# Patient Record
Sex: Female | Born: 1940
Health system: Southern US, Community
[De-identification: ages and names within clinical notes are randomized; demographics above are authoritative.]

## PROBLEM LIST (undated history)

## (undated) DIAGNOSIS — J449 Chronic obstructive pulmonary disease, unspecified: Secondary | ICD-10-CM

## (undated) DIAGNOSIS — K7689 Other specified diseases of liver: Secondary | ICD-10-CM

## (undated) DIAGNOSIS — S32040A Wedge compression fracture of fourth lumbar vertebra, initial encounter for closed fracture: Secondary | ICD-10-CM

## (undated) DIAGNOSIS — I10 Essential (primary) hypertension: Secondary | ICD-10-CM

## (undated) DIAGNOSIS — M1990 Unspecified osteoarthritis, unspecified site: Secondary | ICD-10-CM

## (undated) HISTORY — DX: Chronic obstructive pulmonary disease, unspecified: J44.9

## (undated) HISTORY — PX: ABDOMINAL HYSTERECTOMY: SHX81

## (undated) HISTORY — DX: Other specified diseases of liver: K76.89

## (undated) HISTORY — DX: Unspecified osteoarthritis, unspecified site: M19.90

## (undated) HISTORY — DX: Wedge compression fracture of fourth lumbar vertebra, initial encounter for closed fracture: S32.040A

## (undated) HISTORY — DX: Essential (primary) hypertension: I10

---

## 2000-07-28 ENCOUNTER — Ambulatory Visit (HOSPITAL_COMMUNITY): Admission: RE | Admit: 2000-07-28 | Discharge: 2000-07-28 | Payer: Self-pay | Admitting: Internal Medicine

## 2000-07-28 ENCOUNTER — Encounter: Payer: Self-pay | Admitting: Internal Medicine

## 2002-09-19 ENCOUNTER — Encounter (INDEPENDENT_AMBULATORY_CARE_PROVIDER_SITE_OTHER): Payer: Self-pay | Admitting: Family Medicine

## 2004-06-18 ENCOUNTER — Ambulatory Visit (HOSPITAL_COMMUNITY): Admission: RE | Admit: 2004-06-18 | Discharge: 2004-06-18 | Payer: Self-pay | Admitting: Internal Medicine

## 2004-07-03 ENCOUNTER — Ambulatory Visit (HOSPITAL_COMMUNITY): Admission: RE | Admit: 2004-07-03 | Discharge: 2004-07-03 | Payer: Self-pay | Admitting: Internal Medicine

## 2004-07-04 ENCOUNTER — Ambulatory Visit (HOSPITAL_COMMUNITY): Admission: RE | Admit: 2004-07-04 | Discharge: 2004-07-04 | Payer: Self-pay | Admitting: Internal Medicine

## 2004-08-04 ENCOUNTER — Ambulatory Visit (HOSPITAL_COMMUNITY): Admission: RE | Admit: 2004-08-04 | Discharge: 2004-08-04 | Payer: Self-pay | Admitting: Internal Medicine

## 2005-03-26 ENCOUNTER — Ambulatory Visit: Payer: Self-pay | Admitting: Family Medicine

## 2005-04-13 ENCOUNTER — Ambulatory Visit (HOSPITAL_COMMUNITY): Admission: RE | Admit: 2005-04-13 | Discharge: 2005-04-13 | Payer: Self-pay | Admitting: Family Medicine

## 2005-04-23 ENCOUNTER — Ambulatory Visit: Payer: Self-pay | Admitting: Family Medicine

## 2005-04-27 ENCOUNTER — Encounter (INDEPENDENT_AMBULATORY_CARE_PROVIDER_SITE_OTHER): Payer: Self-pay | Admitting: Family Medicine

## 2005-04-27 LAB — CONVERTED CEMR LAB: RBC count: 4.8 10*6/uL

## 2005-04-28 ENCOUNTER — Encounter (INDEPENDENT_AMBULATORY_CARE_PROVIDER_SITE_OTHER): Payer: Self-pay | Admitting: Family Medicine

## 2005-04-28 ENCOUNTER — Ambulatory Visit (HOSPITAL_COMMUNITY): Admission: RE | Admit: 2005-04-28 | Discharge: 2005-04-28 | Payer: Self-pay | Admitting: Family Medicine

## 2005-04-29 ENCOUNTER — Encounter (INDEPENDENT_AMBULATORY_CARE_PROVIDER_SITE_OTHER): Payer: Self-pay | Admitting: Family Medicine

## 2005-11-04 ENCOUNTER — Ambulatory Visit: Payer: Self-pay | Admitting: Family Medicine

## 2005-11-07 ENCOUNTER — Encounter (INDEPENDENT_AMBULATORY_CARE_PROVIDER_SITE_OTHER): Payer: Self-pay | Admitting: Family Medicine

## 2006-03-16 ENCOUNTER — Encounter: Payer: Self-pay | Admitting: Family Medicine

## 2006-03-16 DIAGNOSIS — K7689 Other specified diseases of liver: Secondary | ICD-10-CM | POA: Insufficient documentation

## 2006-03-16 DIAGNOSIS — IMO0002 Reserved for concepts with insufficient information to code with codable children: Secondary | ICD-10-CM | POA: Insufficient documentation

## 2006-03-16 DIAGNOSIS — F17200 Nicotine dependence, unspecified, uncomplicated: Secondary | ICD-10-CM | POA: Insufficient documentation

## 2006-03-24 ENCOUNTER — Encounter (INDEPENDENT_AMBULATORY_CARE_PROVIDER_SITE_OTHER): Payer: Self-pay | Admitting: Family Medicine

## 2006-09-07 IMAGING — US US ABDOMEN COMPLETE
1 series · 14 of 25 positions shown · non-contrast
Comparison: None.

CLINICAL DATA: Right-sided abdominal pain.  
ABDOMINAL ULTRASOUND:
TECHNIQUE: Real-time ultrasonography of the abdomen was performed.

[Series 1: unknown · 0.33mm/px · 14 of 69 slices shown]
[im 1/69]
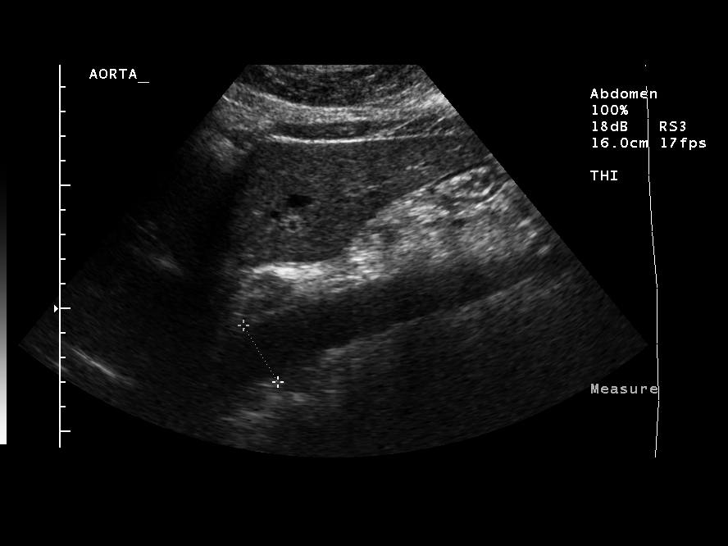
[im 6/69]
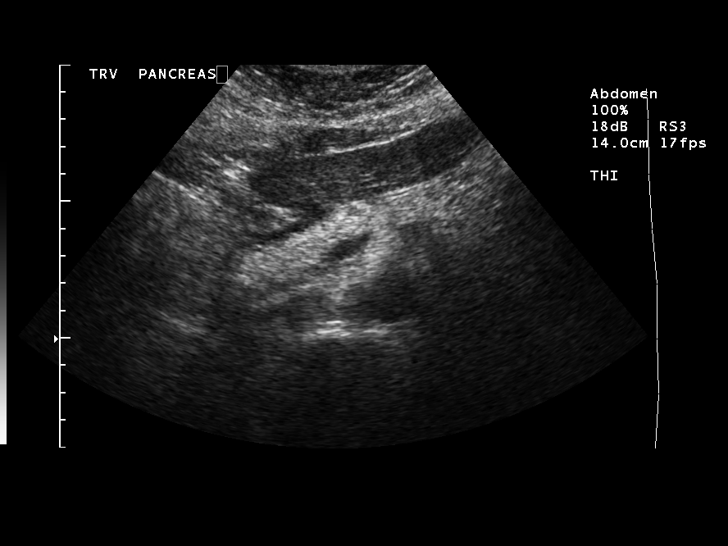
[im 12/69]
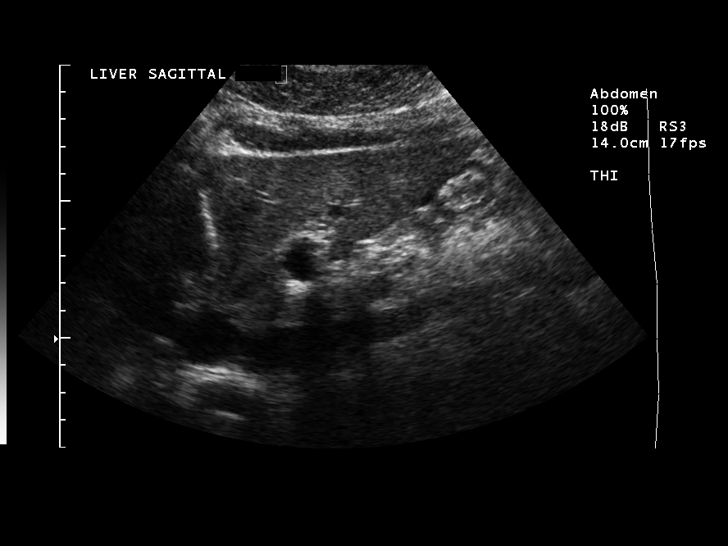
[im 18/69]
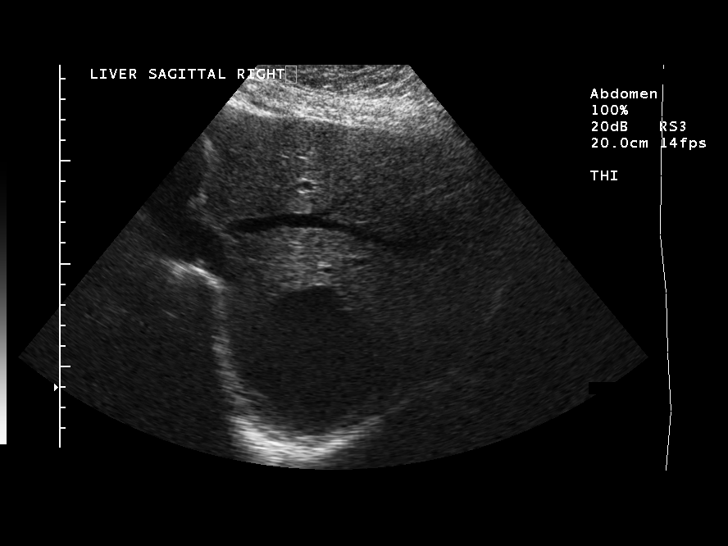
[im 23/69]
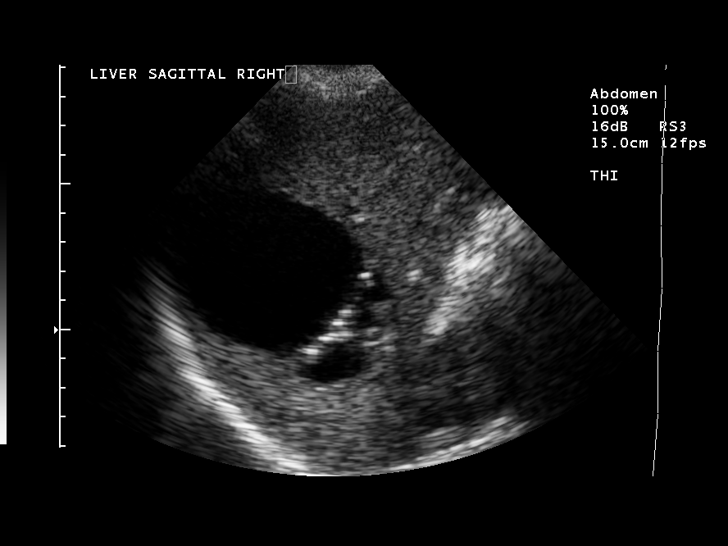
[im 26/69]
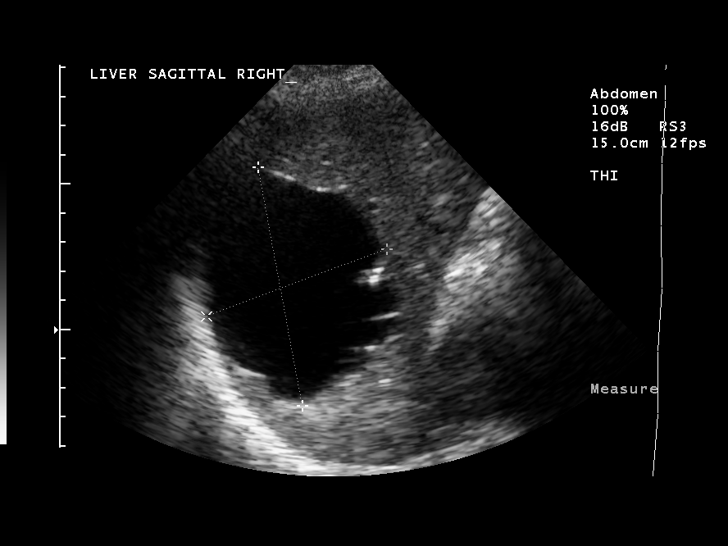
[im 32/69]
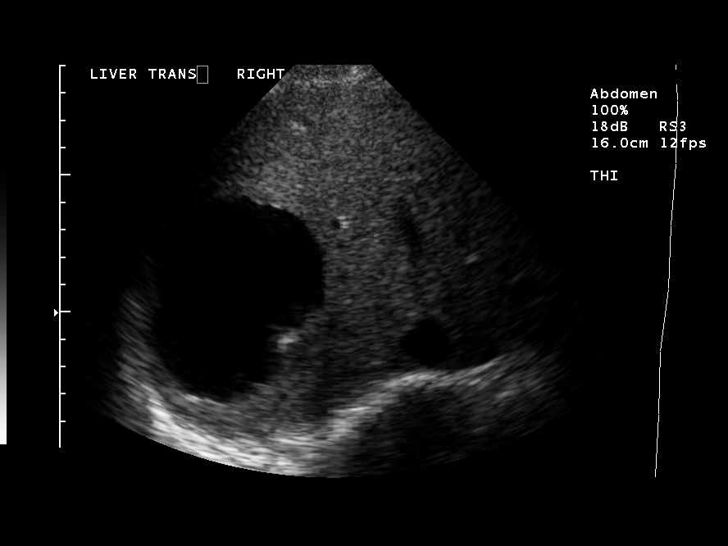
[im 37/69]
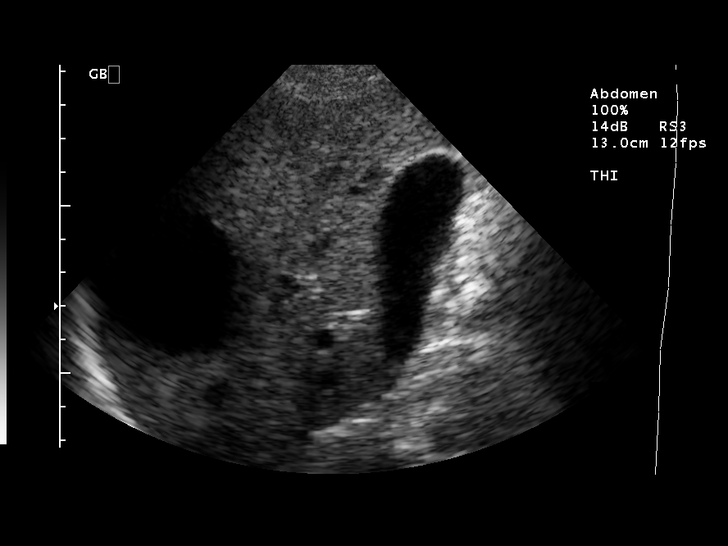
[im 43/69]
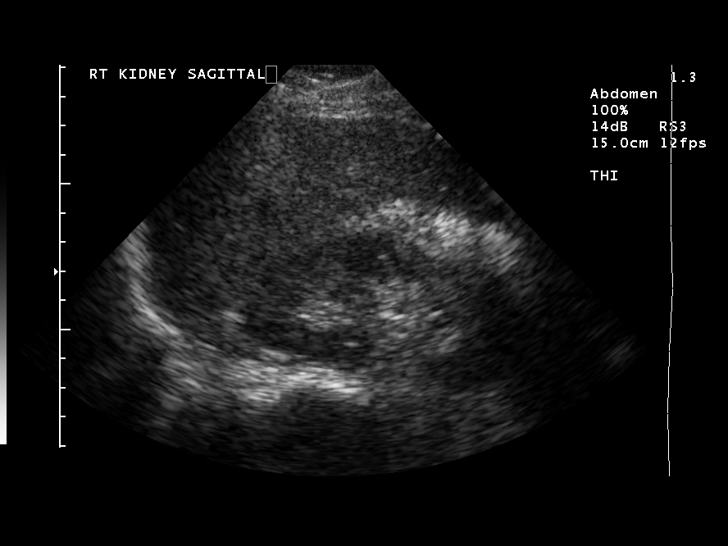
[im 46/69]
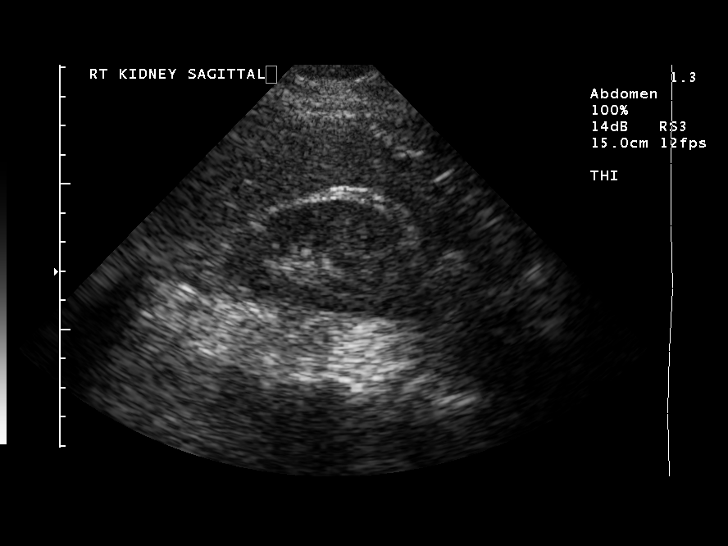
[im 52/69]
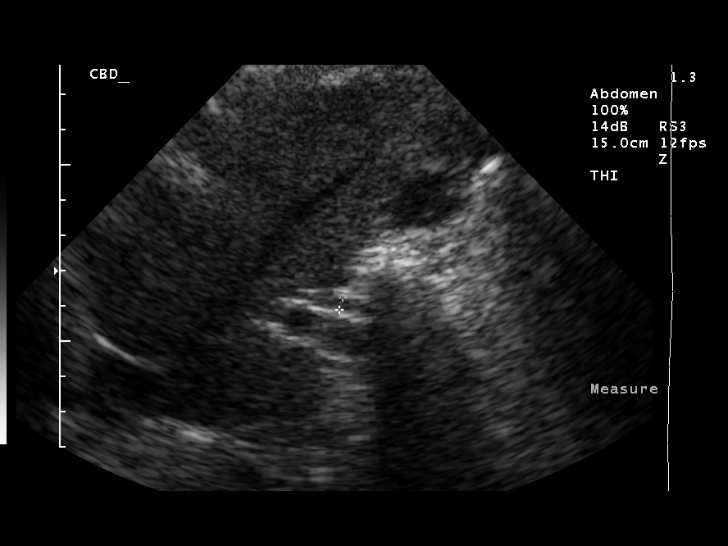
[im 57/69]
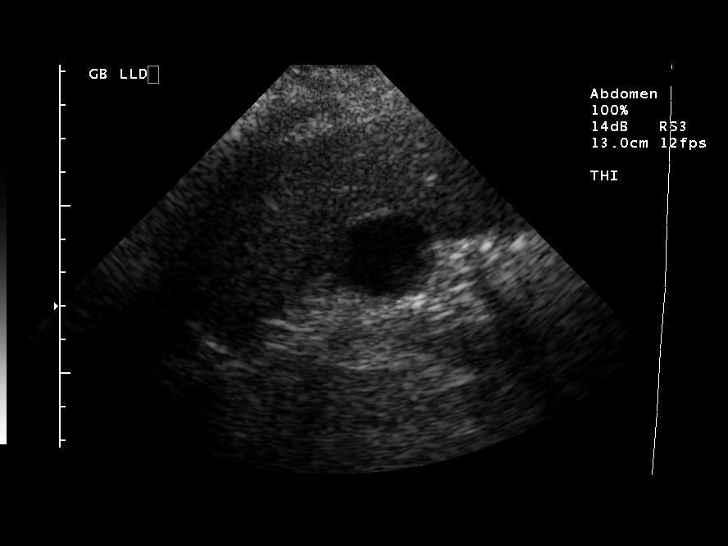
[im 63/69]
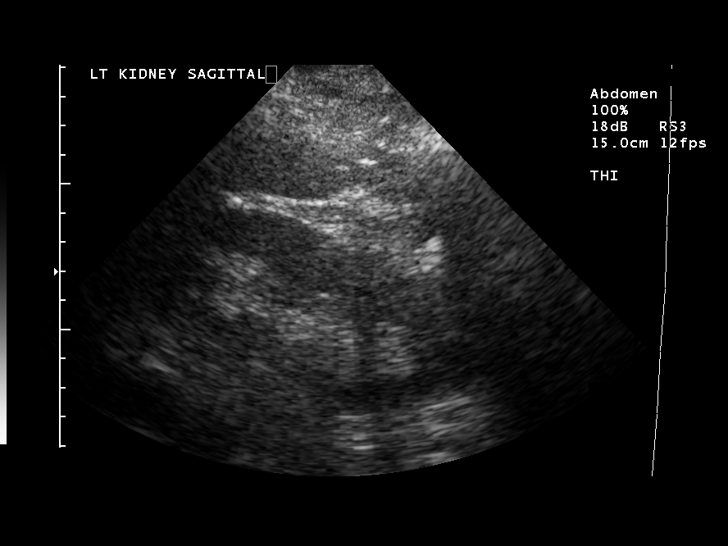
[im 69/69]
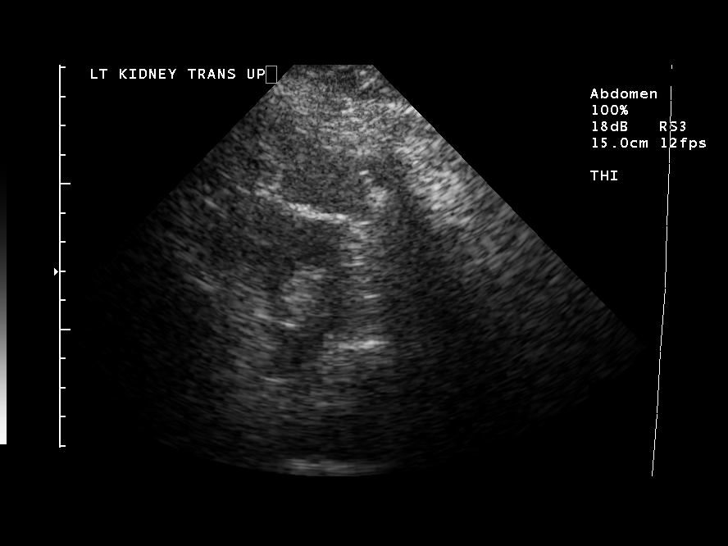

[14 of 25 positions shown; findings below may reference images not displayed]

FINDINGS: Gallbladder appears normal without stones, wall thickening or pericholecystic fluid.  Common bile duct is normal at 3.6 cm.  
Enlarged cystic lesion with some calcifications within its walls is identified in the right lobe of the liver measuring 8.3 x 6.5 x 6.9 cm.  The lesion demonstrates some septations.  No other focal liver lesions are identified.  No intrahepatic biliary duct dilatation.  
Inferior vena cava appears normal. 
Pancreas appears normal. 
Spleen measures 9.6 cm in length and appears normal.  
Right kidney measures 10.2 cm in length and appears normal.  
Left kidney measures 10.8 cm in length and appears normal.  
Abdominal aorta demonstrates a maximal diameter of approximately 2.5 cm, normal.
IMPRESSION: 1.  Normal gallbladder. 
2.  Large cystic lesion in the right lobe of the liver with septation and calcifications.  he lesion is somewhat complex, additional imaging with MRI would be useful for further evaluation.

## 2007-07-03 IMAGING — US US ABDOMEN LIMITED
1 series · 14 of 25 positions shown · non-contrast
Comparison: none

CLINICAL DATA: Follow up hepatic cyst. 
 ULTRASOUND ABDOMEN LIMITED:
TECHNIQUE: Multiple scans of the liver are made and are compared to a previous ultrasound of 06/18/04 and a previous MRI scan of the liver of 07/04/04.

[Series 1: unknown · 0.34mm/px · 14 of 36 slices shown]
[im 1/36]
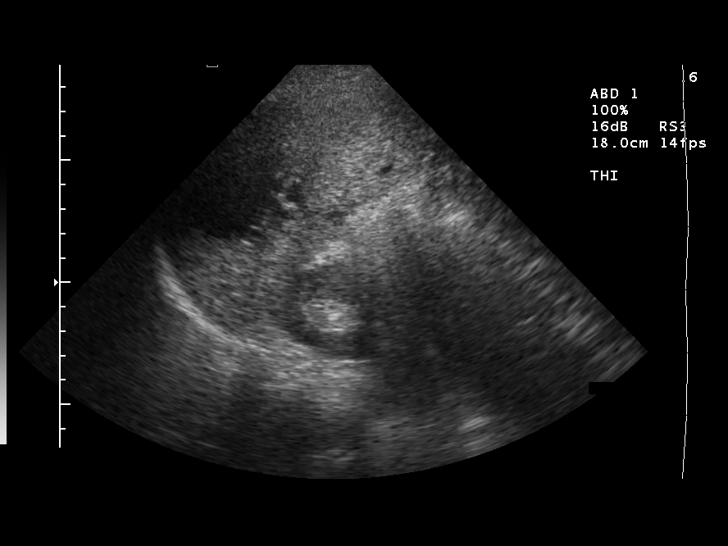
[im 3/36]
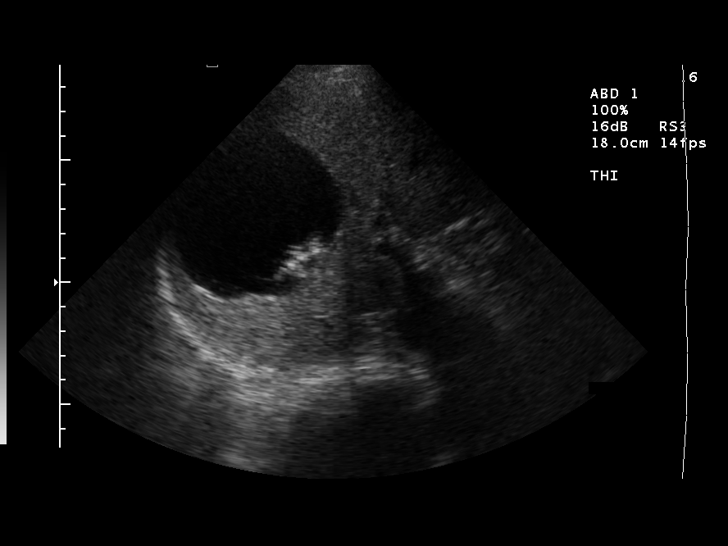
[im 6/36]
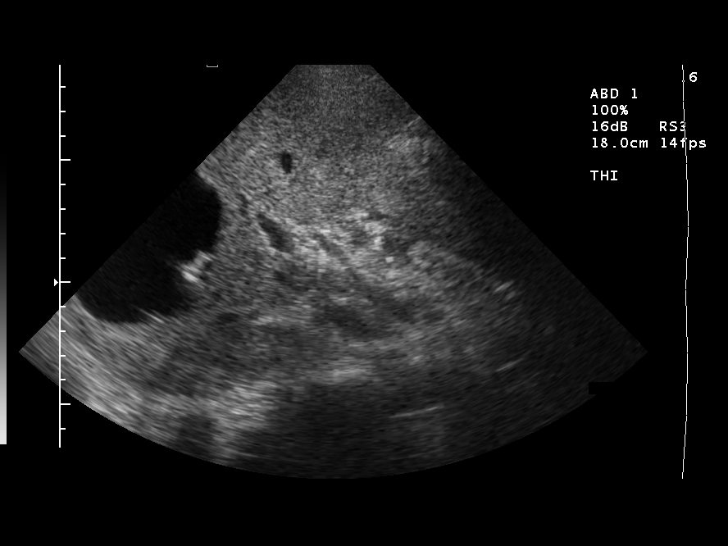
[im 9/36]
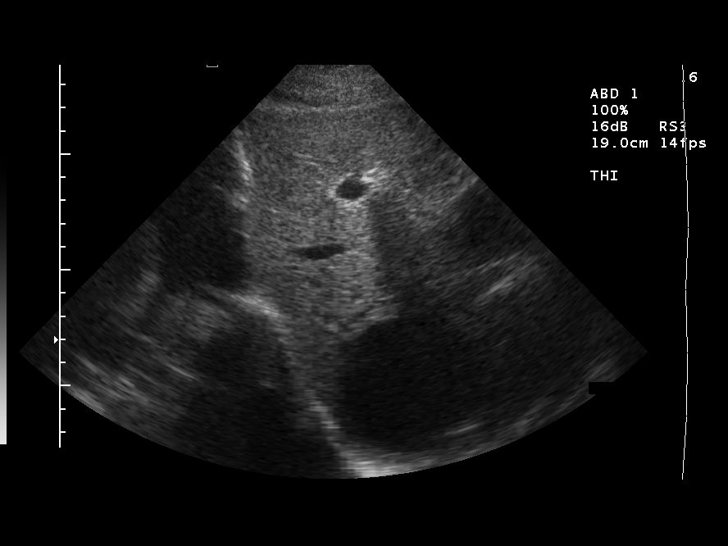
[im 12/36]
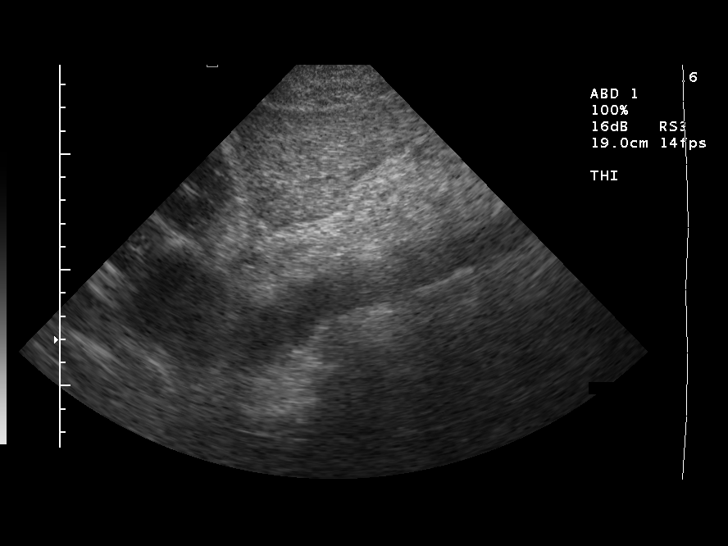
[im 14/36]
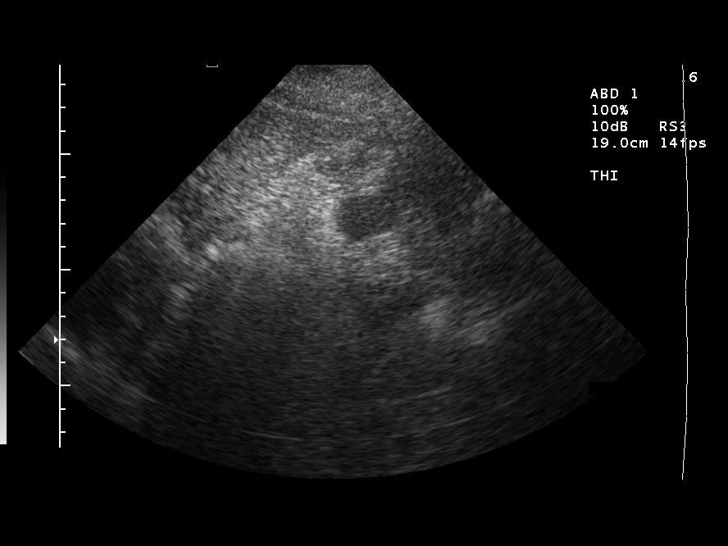
[im 17/36]
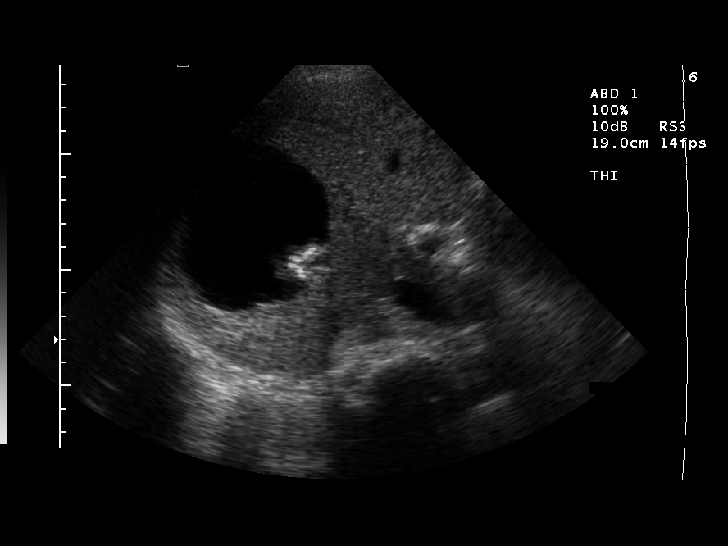
[im 19/36]
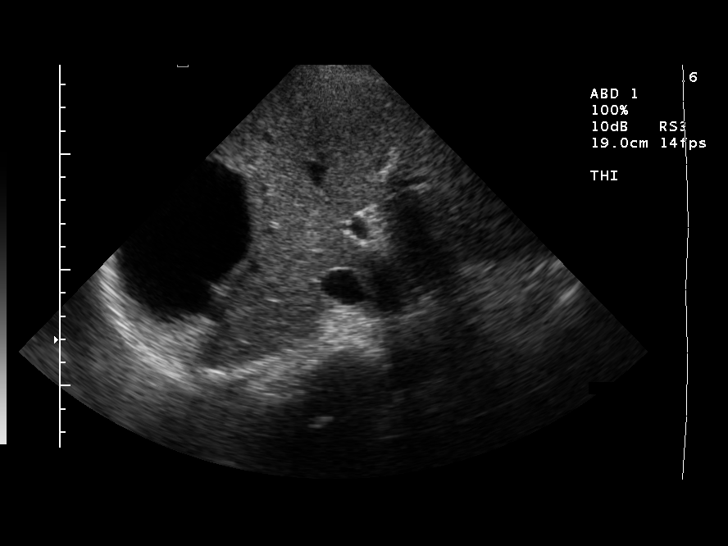
[im 22/36]
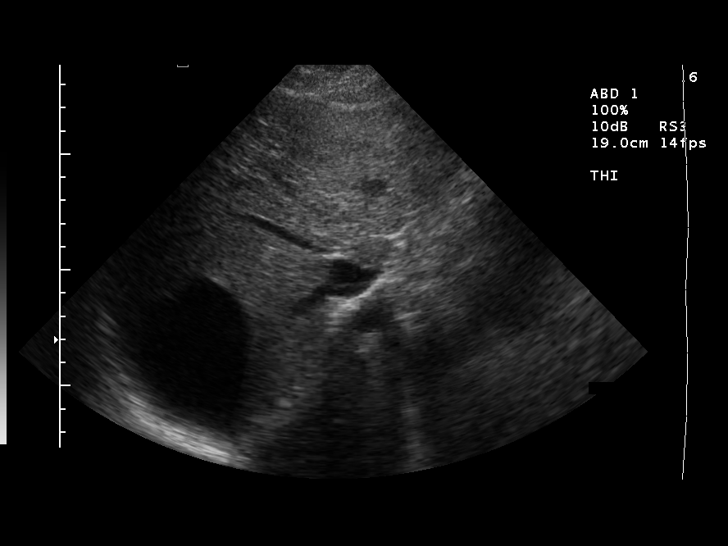
[im 24/36]
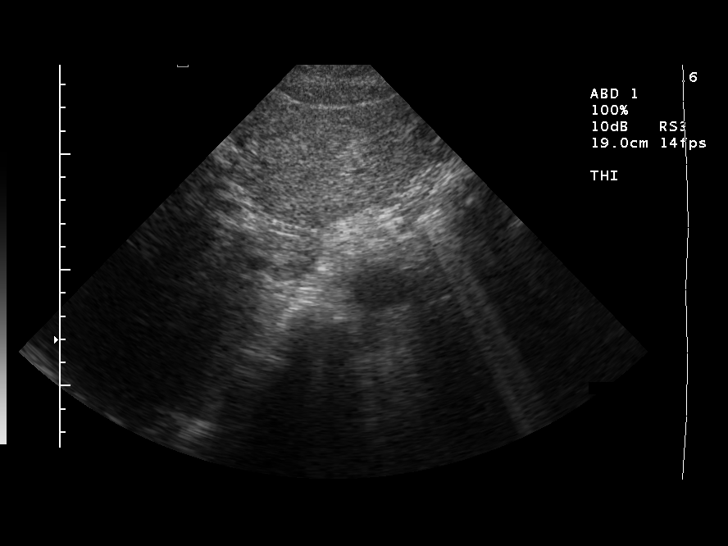
[im 27/36]
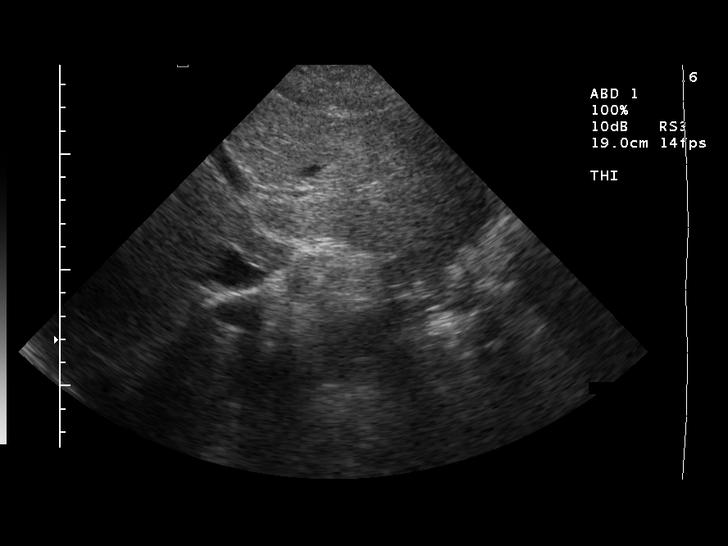
[im 30/36]
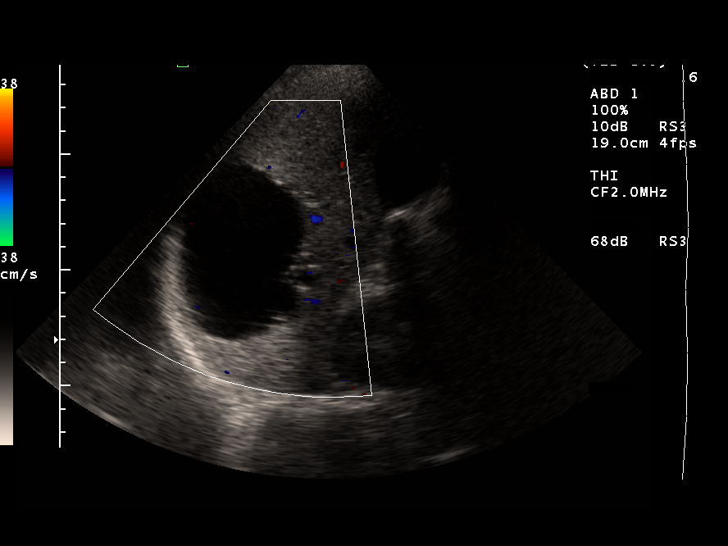
[im 33/36]
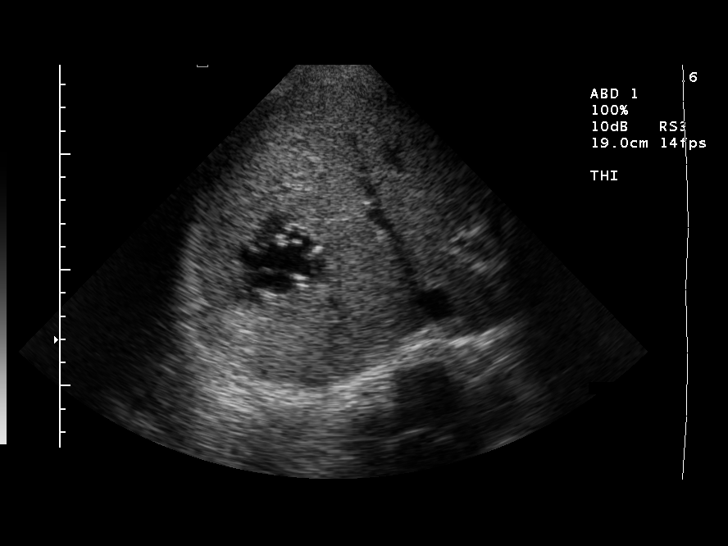
[im 36/36]
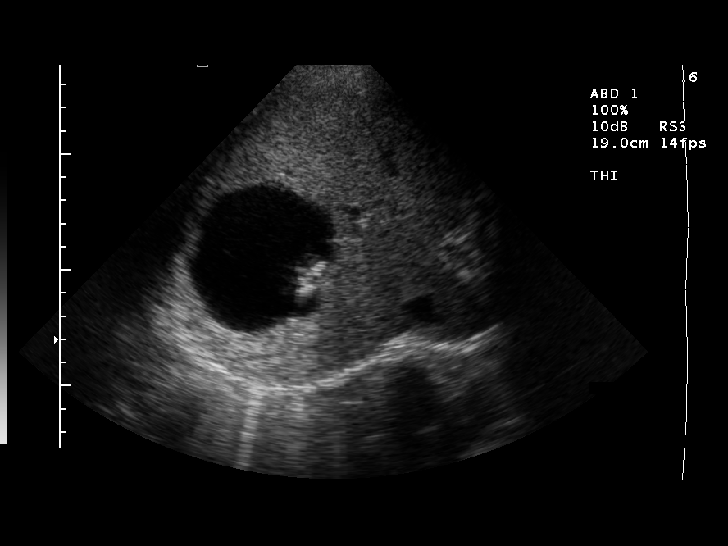

[14 of 25 positions shown; findings below may reference images not displayed]

FINDINGS: Scans show the large of the right lobe of the liver which shows an irregular contour and has some calcifications which now measure 6.3 x 7.2 x 8.2 cm which is not significantly different from the previous studies of 06/18/04 when the lesion measured 6.5 x 6.9 x 8.3 cm.  No new lesions are seen within the liver.  There is no dilatation of the hepatobiliary system.
IMPRESSION: Stable large complex cyst within the right lobe of the liver now measures 6.3 x 7.2 x 8.2 cm and again shows some calcification and irregularity along the margins.

## 2008-03-02 DIAGNOSIS — J449 Chronic obstructive pulmonary disease, unspecified: Secondary | ICD-10-CM

## 2008-03-02 HISTORY — DX: Chronic obstructive pulmonary disease, unspecified: J44.9

## 2008-04-27 ENCOUNTER — Ambulatory Visit: Payer: Self-pay | Admitting: Family Medicine

## 2008-04-30 ENCOUNTER — Encounter (INDEPENDENT_AMBULATORY_CARE_PROVIDER_SITE_OTHER): Payer: Self-pay | Admitting: Family Medicine

## 2008-05-01 LAB — CONVERTED CEMR LAB
Albumin: 4.2 g/dL (ref 3.5–5.2)
Alkaline Phosphatase: 116 units/L (ref 39–117)
Band Neutrophils: 0 % (ref 0–10)
Basophils Absolute: 0 10*3/uL (ref 0.0–0.1)
Basophils Relative: 0 % (ref 0–1)
CO2: 26 meq/L (ref 19–32)
Eosinophils Relative: 1 % (ref 0–5)
HDL: 62 mg/dL (ref >39)
Hemoglobin: 14.9 g/dL (ref 12.0–15.0)
Lymphocytes Relative: 30 % (ref 12–46)
Lymphs Abs: 3 10*3/uL (ref 0.7–4.0)
Monocytes Relative: 10 % (ref 3–12)
Neutrophils Relative %: 59 % (ref 43–77)
RDW: 13.8 % (ref 11.5–15.5)
Total Protein: 6.6 g/dL (ref 6.0–8.3)
VLDL: 18 mg/dL (ref 0–40)
WBC: 10 10*3/uL (ref 4.0–10.5)

## 2008-05-25 ENCOUNTER — Ambulatory Visit: Payer: Self-pay | Admitting: Family Medicine

## 2008-05-25 ENCOUNTER — Ambulatory Visit (HOSPITAL_COMMUNITY): Admission: RE | Admit: 2008-05-25 | Discharge: 2008-05-25 | Payer: Self-pay | Admitting: Family Medicine

## 2008-05-25 ENCOUNTER — Encounter: Payer: Self-pay | Admitting: Orthopedic Surgery

## 2008-05-25 LAB — CONVERTED CEMR LAB
HDL goal, serum: 40 mg/dL
LDL Goal: 160 mg/dL

## 2008-05-26 ENCOUNTER — Encounter (INDEPENDENT_AMBULATORY_CARE_PROVIDER_SITE_OTHER): Payer: Self-pay | Admitting: Family Medicine

## 2008-05-31 ENCOUNTER — Encounter (INDEPENDENT_AMBULATORY_CARE_PROVIDER_SITE_OTHER): Payer: Self-pay | Admitting: Family Medicine

## 2008-06-12 ENCOUNTER — Ambulatory Visit: Payer: Self-pay | Admitting: Orthopedic Surgery

## 2008-06-12 DIAGNOSIS — M19019 Primary osteoarthritis, unspecified shoulder: Secondary | ICD-10-CM | POA: Insufficient documentation

## 2008-06-13 ENCOUNTER — Encounter: Payer: Self-pay | Admitting: Orthopedic Surgery

## 2008-06-19 ENCOUNTER — Encounter (HOSPITAL_COMMUNITY): Admission: RE | Admit: 2008-06-19 | Discharge: 2008-07-19 | Payer: Self-pay | Admitting: Orthopedic Surgery

## 2008-07-03 ENCOUNTER — Encounter: Payer: Self-pay | Admitting: Orthopedic Surgery

## 2008-07-13 ENCOUNTER — Encounter: Payer: Self-pay | Admitting: Orthopedic Surgery

## 2008-08-13 ENCOUNTER — Ambulatory Visit: Payer: Self-pay | Admitting: Orthopedic Surgery

## 2008-09-10 ENCOUNTER — Ambulatory Visit: Payer: Self-pay | Admitting: Family Medicine

## 2008-09-25 ENCOUNTER — Ambulatory Visit: Payer: Self-pay | Admitting: Family Medicine

## 2008-09-25 DIAGNOSIS — I10 Essential (primary) hypertension: Secondary | ICD-10-CM | POA: Insufficient documentation

## 2008-09-26 ENCOUNTER — Encounter (INDEPENDENT_AMBULATORY_CARE_PROVIDER_SITE_OTHER): Payer: Self-pay | Admitting: Family Medicine

## 2008-10-04 ENCOUNTER — Encounter (INDEPENDENT_AMBULATORY_CARE_PROVIDER_SITE_OTHER): Payer: Self-pay | Admitting: Family Medicine

## 2008-10-04 ENCOUNTER — Ambulatory Visit (HOSPITAL_COMMUNITY): Admission: RE | Admit: 2008-10-04 | Discharge: 2008-10-04 | Payer: Self-pay | Admitting: Family Medicine

## 2008-10-12 ENCOUNTER — Encounter (INDEPENDENT_AMBULATORY_CARE_PROVIDER_SITE_OTHER): Payer: Self-pay | Admitting: *Deleted

## 2008-10-22 ENCOUNTER — Ambulatory Visit: Payer: Self-pay | Admitting: Family Medicine

## 2008-10-22 DIAGNOSIS — J449 Chronic obstructive pulmonary disease, unspecified: Secondary | ICD-10-CM | POA: Insufficient documentation

## 2008-10-29 ENCOUNTER — Ambulatory Visit (HOSPITAL_COMMUNITY): Admission: RE | Admit: 2008-10-29 | Discharge: 2008-10-29 | Payer: Self-pay | Admitting: Family Medicine

## 2010-03-23 ENCOUNTER — Encounter: Payer: Self-pay | Admitting: Internal Medicine

## 2010-06-07 LAB — BLOOD GAS, ARTERIAL
Acid-base deficit: 0.1 mmol/L (ref 0.0–2.0)
Bicarbonate: 23.7 mEq/L (ref 20.0–24.0)
TCO2: 20.8 mmol/L (ref 0–100)
pH, Arterial: 7.422 — ABNORMAL HIGH (ref 7.350–7.400)
pO2, Arterial: 92.3 mmHg (ref 80.0–100.0)

## 2010-07-18 NOTE — Procedures (Signed)
[name], Alexis Davis           ACCOUNT NO.:  [id number]   MEDICAL RECORD NO.:  [id number]          PATIENT TYPE:  OUT   LOCATION:  RESP                          FACILITY:  [facility]   PHYSICIAN:  Edward L. Juanetta Gosling, M.D.DATE OF BIRTH:  [birth year]   DATE OF PROCEDURE:  DATE OF DISCHARGE:  10/04/2008                            PULMONARY FUNCTION TEST   1. Spirometry shows no ventilatory defect, but evidence of mild      airflow obstruction.  2. Lung volumes show mild reduction in total lung capacity.  3. DLCO is mildly reduced.  4. Arterial blood gas is normal.  5. There is no significant improvement with inhaled bronchodilator.  6. This is consistent with a clinical diagnosis of bronchitis and may      represent COPD with some mild restrictive change in addition.      Edward L. Juanetta Gosling, M.D.  Electronically Signed     EL[facility]/MEDQ  D:  10/05/2008  T:  10/05/2008  Job:  [id number]   cc:   Franchot Heidelberg, M.D.

## 2011-03-27 ENCOUNTER — Ambulatory Visit (INDEPENDENT_AMBULATORY_CARE_PROVIDER_SITE_OTHER): Payer: Medicare Other | Admitting: Family Medicine

## 2011-03-27 ENCOUNTER — Encounter: Payer: Self-pay | Admitting: Family Medicine

## 2011-03-27 VITALS — BP 170/92 | HR 72 | Resp 16 | Ht 64.0 in | Wt 172.0 lb

## 2011-03-27 DIAGNOSIS — F17200 Nicotine dependence, unspecified, uncomplicated: Secondary | ICD-10-CM

## 2011-03-27 DIAGNOSIS — I10 Essential (primary) hypertension: Secondary | ICD-10-CM

## 2011-03-27 DIAGNOSIS — K7689 Other specified diseases of liver: Secondary | ICD-10-CM

## 2011-03-27 DIAGNOSIS — J449 Chronic obstructive pulmonary disease, unspecified: Secondary | ICD-10-CM

## 2011-03-27 DIAGNOSIS — Z1231 Encounter for screening mammogram for malignant neoplasm of breast: Secondary | ICD-10-CM

## 2011-03-27 DIAGNOSIS — M810 Age-related osteoporosis without current pathological fracture: Secondary | ICD-10-CM

## 2011-03-27 MED ORDER — CALCIUM CARBONATE-VITAMIN D 600-400 MG-UNIT PO TABS: 1.0000 | ORAL_TABLET | Freq: Two times a day (BID) | ORAL | Status: DC

## 2011-03-27 MED ORDER — HYDROCHLOROTHIAZIDE 25 MG PO TABS: 25.0000 mg | ORAL_TABLET | Freq: Every day | ORAL | Status: DC

## 2011-03-27 MED ORDER — ALBUTEROL SULFATE HFA 108 (90 BASE) MCG/ACT IN AERS: 2.0000 | INHALATION_SPRAY | Freq: Four times a day (QID) | RESPIRATORY_TRACT | Status: DC | PRN

## 2011-03-27 NOTE — Assessment & Plan Note (Signed)
Patient counseled on cessation.

## 2011-03-27 NOTE — Progress Notes (Signed)
  Subjective:    Patient ID: Alexis Davis, female    DOB: 11-18-1940, 71 y.o.   MRN: [id number]  HPI Pt here to establish care, last PCP Dr. Margo Aye in [place] Medications and History reviewed Unsure of when last set of labs done  HTN- patient states she is told she has hypertension. She never started the medication recommended by her previous PCP secondary to her concern for side effects. She does not take her blood pressure at home  COPD- she states she's had a chronic cough with small amount of mucus production. She did not know she had a diagnosis of COPD but remembers having bronchitis. She had PFTs done in the past which showed mild obstruction this is in the record. Patient states she treated herself at home for the flu a few weeks ago  Tobacco use- currently smoking half a pack per day  Osteoporosis- was on vitamin D replacement however currently is not taking calcium or vitamin D. She had an x-ray which showed a compression fracture in the past however she did not have any complaint of back pain. At that time the x-ray also showed severe osteoporosis  Hepatic cyst- patient an ultrasound in the past which showed hepatic cysts. Her last followup was approximately 2007 which showed a stable cyst. She also had small renal cyst and adrenal lesion  Arthritis- patient has history of osteoarthritis. She typically has a lot of pain in her right shoulder. She's been seen by orthopedic surgery in the past.   Patient is overdue for mammogram, she declines colonoscopy at this time + pneumonia vaccine  She works with the cleaning service, for our office Review of Systems  GEN- denies fatigue, fever, weight loss,weakness, recent illness HEENT- denies eye drainage, change in vision, nasal discharge, CVS- denies chest pain, palpitations RESP- denies SOB, +cough, denieswheeze ABD- denies N/V, change in stools, abd pain GU- denies dysuria, hematuria, dribbling, incontinence MSK- + joint pain,  muscle aches, injury Neuro- denies headache, dizziness, syncope, seizure activity      Objective:   Physical Exam GEN- NAD, alert and oriented x3 HEENT- PERRL, EOMI, non injected sclera, pink conjunctiva, MMM, oropharynx clear Neck- Supple, no thyromegaly, no bruit CVS- RRR, no murmur RESP-CTAB, normal WOB, no wheeze ABD-NABS- soft NT, ND EXT- No edema Pulses- Radial, DP- 2+        Assessment & Plan:

## 2011-03-27 NOTE — Assessment & Plan Note (Signed)
Will start HCTZ 25 mg tablet daily. Patient will likely need more than one medication however since she is elderly I prefer to start slowly

## 2011-03-27 NOTE — Assessment & Plan Note (Signed)
Patient has a cough. Her exam is normal today. She will be given albuterol to use as needed. If she continues to have worsening symptoms we'll start a maintenance inhaler. Advised tobacco cessation.

## 2011-03-27 NOTE — Patient Instructions (Signed)
I will review your records We will discuss your blood work at the next week  Start the blood pressure medication Take the calcium and Vitamin D Use the albuterol inhaler for severe cough or difficulty breathing F/U in 2 weeks for blood pressure and labs

## 2011-03-27 NOTE — Assessment & Plan Note (Signed)
Patient to start calcium and vitamin D

## 2011-03-27 NOTE — Assessment & Plan Note (Signed)
I will obtain LFTs. She will be sent for another ultrasound in the near future to follow this up

## 2011-03-28 LAB — CBC
HCT: 43.8 % (ref 36.0–46.0)
Hemoglobin: 13.8 g/dL (ref 12.0–15.0)
MCH: 29.6 pg (ref 26.0–34.0)
MCHC: 31.5 g/dL (ref 30.0–36.0)
MCV: 94 fL (ref 78.0–100.0)
RBC: 4.66 MIL/uL (ref 3.87–5.11)
RDW: 13.7 % (ref 11.5–15.5)
WBC: 8.1 10*3/uL (ref 4.0–10.5)

## 2011-03-28 LAB — COMPREHENSIVE METABOLIC PANEL
AST: 16 U/L (ref 0–37)
Alkaline Phosphatase: 96 U/L (ref 39–117)
CO2: 29 mEq/L (ref 19–32)
Chloride: 103 mEq/L (ref 96–112)
Glucose, Bld: 94 mg/dL (ref 70–99)
Sodium: 138 mEq/L (ref 135–145)
Total Bilirubin: 0.5 mg/dL (ref 0.3–1.2)

## 2011-03-28 LAB — LIPID PANEL: Cholesterol: 174 mg/dL (ref 0–200)

## 2011-04-09 ENCOUNTER — Telehealth: Payer: Self-pay | Admitting: Family Medicine

## 2011-04-09 NOTE — Telephone Encounter (Signed)
Patient aware labs normal 

## 2011-04-14 ENCOUNTER — Encounter: Payer: Self-pay | Admitting: Family Medicine

## 2011-04-14 ENCOUNTER — Telehealth: Payer: Self-pay | Admitting: Family Medicine

## 2011-04-14 ENCOUNTER — Ambulatory Visit (INDEPENDENT_AMBULATORY_CARE_PROVIDER_SITE_OTHER): Payer: Medicare Other | Admitting: Family Medicine

## 2011-04-14 VITALS — BP 132/76 | HR 64 | Resp 18 | Ht 64.0 in | Wt 175.0 lb

## 2011-04-14 DIAGNOSIS — I10 Essential (primary) hypertension: Secondary | ICD-10-CM

## 2011-04-14 DIAGNOSIS — J449 Chronic obstructive pulmonary disease, unspecified: Secondary | ICD-10-CM

## 2011-04-14 MED ORDER — DOXYCYCLINE HYCLATE 100 MG PO TABS: 100.0000 mg | ORAL_TABLET | Freq: Two times a day (BID) | ORAL | Status: AC

## 2011-04-14 MED ORDER — FLUTICASONE-SALMETEROL 250-50 MCG/DOSE IN AEPB: 1.0000 | INHALATION_SPRAY | Freq: Two times a day (BID) | RESPIRATORY_TRACT | Status: DC

## 2011-04-14 NOTE — Telephone Encounter (Signed)
Noted will get at next visit

## 2011-04-14 NOTE — Patient Instructions (Signed)
For your breathing and COPD take the antibiotics twice a day with full glass of water Use the new inhaler twice a day  Use the albuterol only if you are really short of breath or wheezing Continue your blood pressure pill Your blood work looks good Today you received your tetanus shot F/u in 3 months

## 2011-04-15 NOTE — Assessment & Plan Note (Signed)
BP much improved, pt tolerating medication

## 2011-04-15 NOTE — Progress Notes (Signed)
  Subjective:    Patient ID: Alexis Davis, female    DOB: [birth year], 71 y.o.   MRN: [id number]  HPI  Recheck blood pressure  COPD- continues to bring up thick mucous, though difficult to get up, feels she cant get over the cold, coughs mostly at night, albuterol "stops her up".  HTN- taking BP meds since last visit, no concerns with meds    Review of Systems    GEN- no fever, fatigue    CVS- denies CP, leg edema, palpitations    RESP- per above    Objective:   Physical Exam  GEN- NAD, alert and oriented x3 CVS- RRR, no murmur RESP-CTAB, normal WOB, no wheeze EXT- No edema Pulses- Radial, DP- 2       Assessment & Plan:

## 2011-04-15 NOTE — Assessment & Plan Note (Signed)
With continued cough and concern, will start maintanance inhaler and antibiotics. She may only need during the winter months

## 2011-07-21 ENCOUNTER — Ambulatory Visit (INDEPENDENT_AMBULATORY_CARE_PROVIDER_SITE_OTHER): Payer: Medicare Other | Admitting: Family Medicine

## 2011-07-21 ENCOUNTER — Encounter: Payer: Self-pay | Admitting: Family Medicine

## 2011-07-21 VITALS — BP 134/78 | HR 64 | Resp 18 | Ht 64.0 in | Wt 173.1 lb

## 2011-07-21 DIAGNOSIS — F17200 Nicotine dependence, unspecified, uncomplicated: Secondary | ICD-10-CM

## 2011-07-21 DIAGNOSIS — M810 Age-related osteoporosis without current pathological fracture: Secondary | ICD-10-CM

## 2011-07-21 DIAGNOSIS — J449 Chronic obstructive pulmonary disease, unspecified: Secondary | ICD-10-CM

## 2011-07-21 DIAGNOSIS — I10 Essential (primary) hypertension: Secondary | ICD-10-CM

## 2011-07-21 DIAGNOSIS — M19019 Primary osteoarthritis, unspecified shoulder: Secondary | ICD-10-CM

## 2011-07-21 MED ORDER — METHYLPREDNISOLONE ACETATE 80 MG/ML IJ SUSP
40.0000 mg | Freq: Once | INTRAMUSCULAR | Status: AC
  Administered 2011-07-21: 40 mg via INTRAMUSCULAR

## 2011-07-21 MED ORDER — HYDROCHLOROTHIAZIDE 25 MG PO TABS: 25.0000 mg | ORAL_TABLET | Freq: Every day | ORAL | Status: DC

## 2011-07-21 NOTE — Assessment & Plan Note (Signed)
Last Dexa scan in 2010 shows ostepenia, continue calcium and Vit D She also takes other supplements such as cactus plant

## 2011-07-21 NOTE — Progress Notes (Signed)
  Subjective:    Patient ID: Alexis Davis, female    DOB: 1940/11/19, 71 y.o.   MRN: [id number]  HPI  Pt here for routine f/u visit Has arthritis in shoulder and would like a shot HTN- tolearting BP meds COPD- unable to afford inhalers, insurance dropped her plan,she will be put in drug plan in Oct. She is doing well, quit smoking no SOB, no cough   Review of Systems  GEN- denies fatigue, fever, weight loss,weakness, recent illness HEENT- denies eye drainage, change in vision, nasal discharge, CVS- denies chest pain, palpitations RESP- denies SOB, cough, wheeze ABD- denies N/V, change in stools, abd pain GU- denies dysuria, hematuria, dribbling, incontinence MSK- +joint pain, muscle aches, injury Neuro- denies headache, dizziness, syncope, seizure activity       Objective:   Physical Exam GEN- NAD, alert and oriented x3 HEENT- PERRL, EOMI, non injected sclera, pink conjunctiva, MMM, oropharynx clear Neck- Supple, no thryomegaly CVS- RRR, no murmur RESP-CTAB ABD-NABS,soft, NT,ND EXT- No edema Feet- long thick nails, normal sensation Pulses- Radial, DP- 2+        Assessment & Plan:

## 2011-07-21 NOTE — Assessment & Plan Note (Signed)
BP well controlled on meds

## 2011-07-21 NOTE — Assessment & Plan Note (Signed)
Pt has quit smoking uses electronic cigarrette

## 2011-07-21 NOTE — Assessment & Plan Note (Signed)
Given Depo medrol injection, she does not want to take any oral meds, advised acetaminophen or Aleve

## 2011-07-21 NOTE — Patient Instructions (Signed)
Continue your blood pressure medication If you have any difficulty with your breathing please call Keep up the good work with the smoking! Try Glucosamine over the counter for joints or Osteo- Biflex Mammogram to be scheduled F/U 4 months

## 2011-07-21 NOTE — Assessment & Plan Note (Signed)
Stable off meds, hopefully she will be okay until her insurance plan kicks in

## 2011-07-30 ENCOUNTER — Ambulatory Visit (HOSPITAL_COMMUNITY)
Admission: RE | Admit: 2011-07-30 | Discharge: 2011-07-30 | Disposition: A | Discharge status: Home / Self Care | Payer: Medicare Other | Source: Ambulatory Visit | Attending: Family Medicine | Admitting: Family Medicine

## 2011-07-30 DIAGNOSIS — Z1231 Encounter for screening mammogram for malignant neoplasm of breast: Secondary | ICD-10-CM

## 2011-08-11 ENCOUNTER — Encounter: Payer: Self-pay | Admitting: Family Medicine

## 2011-08-11 ENCOUNTER — Ambulatory Visit (INDEPENDENT_AMBULATORY_CARE_PROVIDER_SITE_OTHER): Payer: Medicare Other | Admitting: Family Medicine

## 2011-08-11 VITALS — BP 120/84 | HR 84 | Resp 16 | Ht 64.0 in | Wt 172.0 lb

## 2011-08-11 DIAGNOSIS — M79606 Pain in leg, unspecified: Secondary | ICD-10-CM

## 2011-08-11 DIAGNOSIS — M79609 Pain in unspecified limb: Secondary | ICD-10-CM

## 2011-08-11 DIAGNOSIS — R21 Rash and other nonspecific skin eruption: Secondary | ICD-10-CM | POA: Insufficient documentation

## 2011-08-11 NOTE — Assessment & Plan Note (Signed)
No evidence of DVT, varicose veins very small, likley MSK possible some arthritis in hip causing pain, since very acute, she can continue ibuprofen, if no improvement image

## 2011-08-11 NOTE — Patient Instructions (Signed)
Keep doing the ibuprofen take it with food Use the bacitracin on you leg twice a day for 1 week Continue blood pressure medication I want you to call if you are not getting better Keep previous f/u appt in September

## 2011-08-11 NOTE — Progress Notes (Signed)
  Subjective:    Patient ID: Alexis Davis, female    DOB: 05/12/1940, 71 y.o.   MRN: [id number]  HPI   Patient presents with rash to her right shin. She states her leg was itching and she scratched it and scratched it became infected and had a small amount of pus she's been using peroxide on it. It initially started 2 weeks ago. She's also had throbbing in the same leg all the way up to her thigh. She does admit to straining her recliner chair with her right leg which causes some discomfort. No specific injury to leg. She was worried about blood clot in her thigh.   Review of Systems - per above    GEN- denies fatigue, fever, weight loss,weakness, recent illness CVS- denies chest pain, palpitations RESP- denies SOB, cough, wheeze ABD- denies N/V, change in stools, abd pain MSK- + joint pain, muscle aches, injury       Objective:   Physical Exam GEN-NAD,alert and oriented x 3 Back- spine non tender, neg SLR Right knee- no effusion,normal ROM Thigh- mild TTP anterior over varicose, no swelling noted, small varicose veins EXT- no edema, no calf swelling, normal gait, neg homan's Skin- Right shin- 3cm circular lesion of faint erythema, dry skin,no pus, healing abrasion        Assessment & Plan:

## 2011-08-11 NOTE — Assessment & Plan Note (Signed)
I believe she may have superinfected her initial scratch to leg, now with minimal erythema will given bacitracin BID for next week, given packets from clinic.

## 2011-11-19 ENCOUNTER — Encounter: Payer: Self-pay | Admitting: Family Medicine

## 2011-11-19 ENCOUNTER — Ambulatory Visit (INDEPENDENT_AMBULATORY_CARE_PROVIDER_SITE_OTHER): Payer: No Typology Code available for payment source | Admitting: Family Medicine

## 2011-11-19 VITALS — BP 140/90 | HR 66 | Resp 15 | Ht 64.0 in | Wt 171.4 lb

## 2011-11-19 DIAGNOSIS — F17200 Nicotine dependence, unspecified, uncomplicated: Secondary | ICD-10-CM

## 2011-11-19 DIAGNOSIS — I10 Essential (primary) hypertension: Secondary | ICD-10-CM

## 2011-11-19 DIAGNOSIS — J449 Chronic obstructive pulmonary disease, unspecified: Secondary | ICD-10-CM

## 2011-11-19 DIAGNOSIS — M19019 Primary osteoarthritis, unspecified shoulder: Secondary | ICD-10-CM

## 2011-11-19 NOTE — Assessment & Plan Note (Signed)
Suboptimal, no meds taken today, restart HCTZ

## 2011-11-19 NOTE — Assessment & Plan Note (Signed)
Doing well off inhaleres

## 2011-11-19 NOTE — Progress Notes (Signed)
  Subjective:    Patient ID: Alexis Davis, female    DOB: 1941-02-04, 71 y.o.   MRN: [id number]  HPI Patient here to follow chronic medical problems. Her only concern today is chronic right shoulder pain which she has osteoarthritis. She would like to have a shot placed in it. She had difficulty getting her medications because her Medicare part D. is expired. She states her blood pressure medication caused her $80. I spoke with the pharmacist and he did note that her Medicare had expired . She declines flu shot   Review of Systems  GEN- denies fatigue, fever, weight loss,weakness, recent illness HEENT- denies eye drainage, change in vision, nasal discharge, CVS- denies chest pain, palpitations RESP- denies SOB, cough, wheeze ABD- denies N/V, change in stools, abd pain GU- denies dysuria, hematuria, dribbling, incontinence MSK- denies joint pain, muscle aches, injury Neuro- denies headache, dizziness, syncope, seizure activity      Objective:   Physical Exam GEN- NAD, alert and oriented x3 HEENT- PERRL, EOMI, non injected sclera, pink conjunctiva, MMM, oropharynx clear Neck- Supple,  CVS- RRR, no murmur RESP-CTAB ABD-NABS,soft,NT,ND EXT- No edema Pulses- Radial, DP- 2+ Shoulder- decreased ROM - Right shoulder        Assessment & Plan:

## 2011-11-19 NOTE — Patient Instructions (Addendum)
Continue your current medications  Pick up the blood pressure medication  Referral to Dr. Romeo Apple for shoulder injection F/U 4 months

## 2011-11-19 NOTE — Assessment & Plan Note (Signed)
Using electronic cigarrette

## 2011-11-19 NOTE — Assessment & Plan Note (Signed)
Referral to ortho for injection/treatment

## 2011-11-26 ENCOUNTER — Ambulatory Visit (INDEPENDENT_AMBULATORY_CARE_PROVIDER_SITE_OTHER): Payer: Medicare Other | Admitting: Orthopedic Surgery

## 2011-11-26 ENCOUNTER — Encounter: Payer: Self-pay | Admitting: Orthopedic Surgery

## 2011-11-26 VITALS — BP 140/82 | Ht 64.0 in | Wt 171.0 lb

## 2011-11-26 DIAGNOSIS — M25819 Other specified joint disorders, unspecified shoulder: Secondary | ICD-10-CM

## 2011-11-26 DIAGNOSIS — M67919 Unspecified disorder of synovium and tendon, unspecified shoulder: Secondary | ICD-10-CM

## 2011-11-26 DIAGNOSIS — M19019 Primary osteoarthritis, unspecified shoulder: Secondary | ICD-10-CM | POA: Insufficient documentation

## 2011-11-26 DIAGNOSIS — M75101 Unspecified rotator cuff tear or rupture of right shoulder, not specified as traumatic: Secondary | ICD-10-CM

## 2011-11-26 NOTE — Patient Instructions (Addendum)
You have received a steroid shot. 15% of patients experience increased pain at the injection site with in the next 24 hours. This is best treated with ice and tylenol extra strength 2 tabs every 8 hours. If you are still having pain please call the office.   Home exercises  

## 2011-11-26 NOTE — Progress Notes (Signed)
  Subjective:    Patient ID: Alexis Davis, female    DOB: [birth year], 71 y.o.   MRN: [id number]  Shoulder Pain  The pain is present in the right shoulder, right arm and neck. This is a recurrent problem. The current episode started more than 1 month ago. There has been no history of extremity trauma. The problem occurs constantly. The problem has been waxing and waning. The quality of the pain is described as aching and pounding. The pain is at a severity of 8/10. Associated symptoms include joint swelling, a limited range of motion, numbness, stiffness and tingling. Pertinent negatives include no fever or joint locking.      Review of Systems  Constitutional: Negative for fever.  Musculoskeletal: Positive for myalgias and stiffness.  Neurological: Positive for tingling and numbness.  All other systems reviewed and are negative.       Objective:   Physical Exam  Nursing note and vitals reviewed. Constitutional: She is oriented to person, place, and time. She appears well-developed and well-nourished.  HENT:  Head: Normocephalic.  Eyes: Right eye exhibits no discharge. Left eye exhibits no discharge.  Neck: Normal range of motion.  Cardiovascular: Normal rate and intact distal pulses.   Pulmonary/Chest: Effort normal. No respiratory distress.  Abdominal: She exhibits no distension.  Neurological: She is alert and oriented to person, place, and time. She has normal reflexes.  Skin: Skin is warm and dry.  Psychiatric: She has a normal mood and affect. Her behavior is normal. Judgment and thought content normal.  Back Exam   Tenderness  The patient is experiencing tenderness in the cervical.  Range of Motion  Extension: abnormal  Flexion: abnormal  Lateral Bend Right: abnormal  Lateral Bend Left: abnormal  Rotation Right: abnormal  Rotation Left: abnormal   Reflexes  Biceps: normal   Right Shoulder Exam   Tenderness  The patient is experiencing tenderness in the  acromion.  Range of Motion  The patient has normal right shoulder ROM.  Muscle Strength  Abduction: 4/5  Internal Rotation: 5/5  External Rotation: 5/5  Supraspinatus: 4/5  Subscapularis: 5/5  Biceps: 5/5   Tests  Apprehension: negative Drop Arm: negative Hawkin's test: positive Sulcus: absent  Other  Erythema: absent Scars: absent Sensation: normal Pulse: present   Left Shoulder Exam  Left shoulder exam is normal.      We looked at her x-ray from 2010 she had some mild glenohumeral arthritis some a.c. joint arthrosis      Assessment & Plan:  Rotator cuff syndrome right shoulder with possible cervical spine spondylosis  Subacromial injection home exercises if no improvement image the cervical spine

## 2012-02-15 ENCOUNTER — Ambulatory Visit (INDEPENDENT_AMBULATORY_CARE_PROVIDER_SITE_OTHER): Payer: Medicare Other | Admitting: Family Medicine

## 2012-02-15 ENCOUNTER — Encounter: Payer: Self-pay | Admitting: Family Medicine

## 2012-02-15 VITALS — BP 130/78 | HR 65 | Resp 18 | Ht 64.0 in | Wt 175.0 lb

## 2012-02-15 DIAGNOSIS — M19019 Primary osteoarthritis, unspecified shoulder: Secondary | ICD-10-CM

## 2012-02-15 DIAGNOSIS — I10 Essential (primary) hypertension: Secondary | ICD-10-CM

## 2012-02-15 DIAGNOSIS — J449 Chronic obstructive pulmonary disease, unspecified: Secondary | ICD-10-CM

## 2012-02-15 NOTE — Progress Notes (Signed)
  Subjective:    Patient ID: Alexis Davis, female    DOB: 12/16/1940, 71 y.o.   MRN: [id number]  HPI Pt here to f/u chronic medical problems. No specific concerns doing well. She is status post steroid injection into her shoulder by orthopedics and her pain is now resolved. She has changed her insurance and would not give to have her medications refilled and labs until after the new year she does have enough medication to last her. She declines shingle shot, flu shot, tetanus shot   Review of Systems  GEN- denies fatigue, fever, weight loss,weakness, recent illness HEENT- denies eye drainage, change in vision, nasal discharge, CVS- denies chest pain, palpitations RESP- denies SOB, cough, wheeze ABD- denies N/V, change in stools, abd pain GU- denies dysuria, hematuria, dribbling, incontinence MSK- denies joint pain, muscle aches, injury Neuro- denies headache, dizziness, syncope, seizure activity      Objective:   Physical Exam  GEN- NAD, alert and oriented x3 HEENT- PERRL, EOMI, non injected sclera, pink conjunctiva, MMM, oropharynx clear Neck- Supple,no bruit CVS- RRR, no murmur RESP-CTAB ABS- NABS,soft,NT,ND EXT- No edema Pulses- Radial, DP- 2+       Assessment & Plan:

## 2012-02-15 NOTE — Patient Instructions (Signed)
Continue current medications Call if you have any problems  Get the labs done February - fasting  F/U 6 months

## 2012-02-15 NOTE — Assessment & Plan Note (Addendum)
Improved on meds, continue  Fasting labs before next visit

## 2012-02-15 NOTE — Assessment & Plan Note (Signed)
No symptoms, no meds currently being used

## 2012-02-15 NOTE — Assessment & Plan Note (Signed)
Doing well s/p injection

## 2012-04-28 ENCOUNTER — Encounter: Payer: Self-pay | Admitting: Family Medicine

## 2012-04-28 ENCOUNTER — Ambulatory Visit (INDEPENDENT_AMBULATORY_CARE_PROVIDER_SITE_OTHER): Payer: Medicare Other | Admitting: Family Medicine

## 2012-04-28 VITALS — BP 140/72 | HR 74 | Resp 18 | Ht 64.0 in | Wt 172.0 lb

## 2012-04-28 DIAGNOSIS — G5790 Unspecified mononeuropathy of unspecified lower limb: Secondary | ICD-10-CM

## 2012-04-28 DIAGNOSIS — S93409A Sprain of unspecified ligament of unspecified ankle, initial encounter: Secondary | ICD-10-CM

## 2012-04-28 DIAGNOSIS — S99921A Unspecified injury of right foot, initial encounter: Secondary | ICD-10-CM

## 2012-04-28 MED ORDER — NAPROXEN 500 MG PO TABS: 500.0000 mg | ORAL_TABLET | Freq: Two times a day (BID) | ORAL | Status: DC

## 2012-04-28 NOTE — Patient Instructions (Addendum)
Get the xrays Take the prescription anti-inflammatory I will call with results of xray tomorrow Use ACE wrap when up and about Elevate leg for any swelling
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
## 2012-04-29 ENCOUNTER — Ambulatory Visit (HOSPITAL_COMMUNITY)
Admission: RE | Admit: 2012-04-29 | Discharge: 2012-04-29 | Disposition: A | Discharge status: Home / Self Care | Payer: Medicare Other | Source: Ambulatory Visit | Attending: Family Medicine | Admitting: Family Medicine

## 2012-04-29 DIAGNOSIS — G5790 Unspecified mononeuropathy of unspecified lower limb: Secondary | ICD-10-CM | POA: Insufficient documentation

## 2012-04-29 DIAGNOSIS — S93409A Sprain of unspecified ligament of unspecified ankle, initial encounter: Secondary | ICD-10-CM | POA: Insufficient documentation

## 2012-04-29 DIAGNOSIS — M25579 Pain in unspecified ankle and joints of unspecified foot: Secondary | ICD-10-CM | POA: Insufficient documentation

## 2012-04-29 DIAGNOSIS — S99921A Unspecified injury of right foot, initial encounter: Secondary | ICD-10-CM

## 2012-04-29 DIAGNOSIS — X58XXXA Exposure to other specified factors, initial encounter: Secondary | ICD-10-CM | POA: Insufficient documentation

## 2012-04-29 DIAGNOSIS — S8990XA Unspecified injury of unspecified lower leg, initial encounter: Secondary | ICD-10-CM | POA: Insufficient documentation

## 2012-04-29 NOTE — Assessment & Plan Note (Signed)
She has some neuropathic symptoms in her legs with burning sensation down to her feet this is very acute in setting may be related to the fall into some muscle pain I will start on any medications at this time

## 2012-04-29 NOTE — Assessment & Plan Note (Signed)
Sprain of right ankle joint, xray neg for fracture, bone spur at heel and some DJD noted. Naprosyn BID x 2-3 weeks, ortho if no improvement ACE wrap in office

## 2012-06-28 ENCOUNTER — Ambulatory Visit: Payer: Medicare Other | Admitting: Family Medicine

## 2012-06-28 ENCOUNTER — Ambulatory Visit (INDEPENDENT_AMBULATORY_CARE_PROVIDER_SITE_OTHER): Payer: Medicare Other | Admitting: Family Medicine

## 2012-06-28 VITALS — BP 130/84 | HR 88 | Resp 16 | Wt 173.0 lb

## 2012-06-28 DIAGNOSIS — S93401D Sprain of unspecified ligament of right ankle, subsequent encounter: Secondary | ICD-10-CM

## 2012-06-28 DIAGNOSIS — S93409A Sprain of unspecified ligament of unspecified ankle, initial encounter: Secondary | ICD-10-CM

## 2012-06-28 DIAGNOSIS — R21 Rash and other nonspecific skin eruption: Secondary | ICD-10-CM

## 2012-06-28 DIAGNOSIS — I10 Essential (primary) hypertension: Secondary | ICD-10-CM

## 2012-06-28 MED ORDER — HYDROCHLOROTHIAZIDE 25 MG PO TABS: 25.0000 mg | ORAL_TABLET | Freq: Every day | ORAL | Status: DC

## 2012-06-28 NOTE — Patient Instructions (Addendum)
Appt with Dr. Romeo Apple for the ankle Wrap the ankle or get a brace - for support over the counter Use the antibiotic for your leg Get the labs done fasting F/U 6 months
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
## 2012-06-29 ENCOUNTER — Encounter: Payer: Self-pay | Admitting: Family Medicine

## 2012-06-29 DIAGNOSIS — R21 Rash and other nonspecific skin eruption: Secondary | ICD-10-CM | POA: Insufficient documentation

## 2012-06-29 NOTE — Assessment & Plan Note (Signed)
Well controlled, fasting labs 

## 2012-06-29 NOTE — Assessment & Plan Note (Signed)
Will treat with topical antibiotics to shin

## 2012-06-29 NOTE — Assessment & Plan Note (Addendum)
Refer to orthopedics, continue pain and swelling 2 months later Advised brace, icing for support

## 2012-06-30 LAB — LIPID PANEL
Cholesterol: 153 mg/dL (ref 0–200)
HDL: 47 mg/dL (ref >39)
Total CHOL/HDL Ratio: 3.3 Ratio
Triglycerides: 109 mg/dL (ref <150)
VLDL: 22 mg/dL (ref 0–40)

## 2012-06-30 LAB — BASIC METABOLIC PANEL
BUN: 10 mg/dL (ref 6–23)
CO2: 25 mEq/L (ref 19–32)
Calcium: 9.6 mg/dL (ref 8.4–10.5)
Creat: 0.65 mg/dL (ref 0.50–1.10)
Potassium: 5 mEq/L (ref 3.5–5.3)

## 2012-06-30 LAB — CBC
HCT: 39.8 % (ref 36.0–46.0)
MCV: 86.7 fL (ref 78.0–100.0)
Platelets: 241 10*3/uL (ref 150–400)
RBC: 4.59 MIL/uL (ref 3.87–5.11)

## 2012-07-13 ENCOUNTER — Encounter: Payer: Self-pay | Admitting: Orthopedic Surgery

## 2012-07-13 ENCOUNTER — Ambulatory Visit (INDEPENDENT_AMBULATORY_CARE_PROVIDER_SITE_OTHER): Payer: Medicare Other | Admitting: Orthopedic Surgery

## 2012-07-13 VITALS — BP 142/86 | Ht 64.0 in | Wt 171.0 lb

## 2012-07-13 DIAGNOSIS — M76829 Posterior tibial tendinitis, unspecified leg: Secondary | ICD-10-CM

## 2012-07-13 DIAGNOSIS — M6789 Other specified disorders of synovium and tendon, multiple sites: Secondary | ICD-10-CM

## 2012-07-13 MED ORDER — DICLOFENAC POTASSIUM 50 MG PO TABS: 50.0000 mg | ORAL_TABLET | Freq: Two times a day (BID) | ORAL | Status: DC

## 2012-07-13 NOTE — Patient Instructions (Addendum)
Attention: You have  indicated that you are still smoking.  If you are interested in quitting please see your primary care physician regarding measures you can take to improve your health and stop smoking.  Take 1 tablet twice a day medication at the pharmacy  Wear boot 6 weeks   Remove boot to drive   Posterior Tibial Tendon Tendinitis with Rehab Tendonitis is a condition that is characterized by inflammation of a tendon or the lining (sheath) that surrounds it. The inflammation is usually caused by damage to the tendon, such as a tendon tear (strain). Sprains are classified into three categories. Grade 1 sprains cause pain, but the tendon is not lengthened. Grade 2 sprains include a lengthened ligament due to the ligament being stretched or partially ruptured. With grade 2 sprains there is still function, although the function may be diminished. Grade 3 sprains are characterized by a complete tear of the tendon or muscle, and function is usually impaired. Posterior tibialis tendonitis is tendonitis of the posterior tibial tendon, which attaches muscles of the lower leg to the foot. The posterior tibial tendon is located in the back of the ankle and helps the body straighten (plantarflex) and rotate inward (medially rotate) the ankle. SYMPTOMS   Pain, tenderness, swelling, warmth, and/or redness over the back of the inner ankle at the posterior tibial tendon or the inner part of the mid-foot.  Pain that worsens with plantarflexion or medial rotation of the ankle.  A crackling sound (crepitation) when the tendon is moved or touched. CAUSES  Posterior tibial tendonitis occurs when damage to the posterior tibial tendon starts an inflammatory response. Common mechanisms of injury include:  Degenerative (occurs with aging) processes that weaken the tendon and make it more susceptible to injury.  Stress placed on the tendon from an increase in the intensity, frequency, or duration of  training.  Direct trauma to the ankle.  Returning to activity before a previous ankle injury is allowed to heal. RISK INCREASES WITH:  Activities that involve repetitive and/or stressful plantarflexion (jumping, kicking, or running up/down hills).  Poor strength and flexibility.  Flat feet.  Previous injury to the foot, ankle, or leg. PREVENTION   Warm up and stretch properly before activity.  Allow for adequate recovery between workouts.  Maintain physical fitness:  Strength, flexibility, and endurance.  Cardiovascular fitness.  Learn and use proper technique. When possible, have a coach correct improper technique.  Complete rehabilitation from a previous foot, ankle, or leg injury.  If you have flat feet, wear arch supports (orthotics). PROGNOSIS  If treated properly, then the symptoms of tendonitis usually resolve within 6 weeks. This period may be shorter for injuries caused by direct trauma. RELATED COMPLICATIONS   Prolonged healing time, if improperly treated or re-injured.  Recurrent symptoms that result in a chronic problem.  Partial or complete tendon tear (rupture) requiring surgery. TREATMENT  Treatment initially involves the use of ice and medication to help reduce pain and inflammation. The use of strengthening and stretching exercises may help reduce pain with activity. These exercises may be performed at home or with referral to a therapist. Often times, your caregiver will recommend immobilizing the ankle to allow the tendon to heal. If you have flat feet, the you may be advised to wear orthotic arch supports. If symptoms persist for greater than 6 months despite non-surgical (conservative) treatment, then surgery may be recommended. MEDICATION   If pain medication is necessary, then nonsteroidal anti-inflammatory medications, such as aspirin and ibuprofen,  or other minor pain relievers, such as acetaminophen, are often recommended.  Do not take pain  medication for 7 days before surgery.  Prescription pain relievers may be given if deemed necessary by your caregiver. Use only as directed and only as much as you need.  Corticosteroid injections may be given by your caregiver. These injections should be reserved for the most serious cases, because they may only be given a certain number of times. HEAT AND COLD  Cold treatment (icing) relieves pain and reduces inflammation. Cold treatment should be applied for 10 to 15 minutes every 2 to 3 hours for inflammation and pain and immediately after any activity that aggravates your symptoms. Use ice packs or massage the area with a piece of ice (ice massage).  Heat treatment may be used prior to performing the stretching and strengthening activities prescribed by your caregiver, physical therapist, or athletic trainer. Use a heat pack or soak the injury in warm water. SEEK MEDICAL CARE IF:  Treatment seems to offer no benefit, or the condition worsens.  Any medications produce adverse side effects. EXERCISES RANGE OF MOTION (ROM) AND STRETCHING EXERCISES - Posterior Tibial Tendon Tendinitis These exercises may help you when beginning to rehabilitate your injury. Your symptoms may resolve with or without further involvement from your physician, physical therapist or athletic trainer. While completing these exercises, remember:   Restoring tissue flexibility helps normal motion to return to the joints. This allows healthier, less painful movement and activity.  An effective stretch should be held for at least 30 seconds.  A stretch should never be painful. You should only feel a gentle lengthening or release in the stretched tissue. RANGE OF MOTION - Ankle Plantar Flexion   Sit with your right / left leg crossed over your opposite knee.  Use your opposite hand to pull the top of your foot and toes toward you.  You should feel a gentle stretch on the top of your foot/ankle. Hold this position  for __________ seconds. Repeat __________ times. Complete this exercise __________ times per day.  RANGE OF MOTION - Ankle Eversion   Sit with your right / left ankle crossed over your opposite knee.  Grip your foot with your opposite hand, placing your thumb on the top of your foot and your fingers across the bottom of your foot.  Gently push your foot downward with a slight rotation so your littlest toes rise slightly  You should feel a gentle stretch on the inside of your ankle. Hold the stretch for __________ seconds. Repeat __________ times. Complete this exercise __________ times per day.  RANGE OF MOTION - Ankle Inversion   Sit with your right / left ankle crossed over your opposite knee.  Grip your foot with your opposite hand, placing your thumb on the bottom of your foot and your fingers across the top of your foot.  Gently pull your foot so the smallest toe comes toward you and your thumb pushes the inside of the ball of your foot away from you.  You should feel a gentle stretch on the outside of your ankle. Hold the stretch for __________ seconds. Repeat __________ times. Complete this exercise __________ times per day.  RANGE OF MOTION - Dorsi/Plantar Flexion  While sitting with your right / left knee straight, draw the top of your foot upwards by flexing your ankle. Then reverse the motion, pointing your toes downward.  Hold each position for __________ seconds.  After completing your first set of exercises, repeat  this exercise with your knee bent. Repeat __________ times. Complete this exercise __________ times per day.  RANGE OF MOTION - Ankle Alphabet  Imagine your right / left big toe is a pen.  Keeping your hip and knee still, write out the entire alphabet with your "pen." Make the letters as large as you can without increasing any discomfort. Repeat __________ times. Complete this exercise __________ times per day.  STRETCH - Gastrocsoleus   Sit with your  right / left leg extended. Holding onto both ends of a belt or towel, loop it around the ball of your foot.  Keeping your right / left ankle and foot relaxed and your knee straight, pull your foot and ankle toward you using the belt/towel.  You should feel a gentle stretch behind your calf or knee. Hold this position for __________ seconds. Repeat __________ times. Complete this exercise __________ times per day.  STRETCH  Gastroc, Standing   Place hands on wall.  Extend right / left leg, keeping the front knee somewhat bent.  Slightly point your toes inward on your back foot.  Keeping your right / left heel on the floor and your knee straight, shift your weight toward the wall, not allowing your back to arch.  You should feel a gentle stretch in the right / left calf. Hold this position for __________ seconds. Repeat __________ times. Complete this stretch __________ times per day. STRETCH  Soleus, Standing   Place hands on wall.  Extend right / left leg, keeping the other knee somewhat bent.  Slightly point your toes inward on your back foot.  Keep your right / left heel on the floor, bend your back knee, and slightly shift your weight over the back leg so that you feel a gentle stretch deep in your back calf.  Hold this position for __________ seconds. Repeat __________ times. Complete this stretch __________ times per day. STRENGTHENING EXERCISES - Posterior Tibial Tendon Tendinitis These exercises may help you when beginning to rehabilitate your injury. They may resolve your symptoms with or without further involvement from your physician, physical therapist or athletic trainer. While completing these exercises, remember:   Muscles can gain both the endurance and the strength needed for everyday activities through controlled exercises.  Complete these exercises as instructed by your physician, physical therapist or athletic trainer. Progress the resistance and repetitions only  as guided. STRENGTH - Dorsiflexors  Secure a rubber exercise band/tubing to a fixed object (ie. table, pole) and loop the other end around your right / left foot.  Sit on the floor facing the fixed object. The band/tubing should be slightly tense when your foot is relaxed.  Slowly draw your foot back toward you using your ankle and toes.  Hold this position for __________ seconds. Slowly release the tension in the band and return your foot to the starting position. Repeat __________ times. Complete this exercise __________ times per day.  STRENGTH - Towel Curls  Sit in a chair positioned on a non-carpeted surface.  Place your foot on a towel, keeping your heel on the floor.  Pull the towel toward your heel by only curling your toes. Keep your heel on the floor.  If instructed by your physician, physical therapist or athletic trainer, add ____________________ at the end of the towel. Repeat __________ times. Complete this exercise __________ times per day. STRENGTH - Ankle Eversion   Secure one end of a rubber exercise band/tubing to a fixed object (table, pole). Loop the other end  around your foot just before your toes.  Place your fists between your knees. This will focus your strengthening at your ankle.  Drawing the band/tubing across your opposite foot, slowly, pull your little toe out and up. Make sure the band/tubing is positioned to resist the entire motion.  Hold this position for __________ seconds.  Have your muscles resist the band/tubing as it slowly pulls your foot back to the starting position. Repeat __________ times. Complete this exercise __________ times per day.  STRENGTH - Ankle Inversion   Secure one end of a rubber exercise band/tubing to a fixed object (table, pole). Loop the other end around your foot just before your toes.  Place your fists between your knees. This will focus your strengthening at your ankle.  Slowly, pull your big toe up and in, making  sure the band/tubing is positioned to resist the entire motion.  Hold this position for __________ seconds.  Have your muscles resist the band/tubing as it slowly pulls your foot back to the starting position. Repeat __________ times. Complete this exercises __________ times per day.  Document Released: 02/16/2005 Document Revised: 05/11/2011 Document Reviewed: 05/31/2008 [facility] Patient Information 2013 [place], [place].

## 2012-07-13 NOTE — Progress Notes (Signed)
Patient ID: Alexis Davis, female   DOB: [birth year], 72 y.o.   MRN: [id number] Chief Complaint  Patient presents with  . Foot Pain    Right foot pain    History 72 year-old female started having pain in the liver foot about 3 months ago denies any trauma. She complains of sharp 4/10 pain which is worse when she is up on her feet for while better with rest. She's also noticed some swelling she started had an x-ray shows midfoot arthrosis she denies any previous treatment  Review of systems negative except for stiffness of the joints  She denies medical problems previous surgery she denies having a primary care physician  She has a family history arthritis  She's married  She does smoke a pack of cigarettes per week does not drink no street drug use  Past Medical History  Diagnosis Date  . Hypertension   . COPD (chronic obstructive pulmonary disease) 2010    PFT  . Hepatic cyst   . Compression fracture of L4 lumbar vertebra     Pt did not have pain, osteoporosis  . Arthritis     Shoulder  . Osteoporosis    Past Surgical History  Procedure Laterality Date  . Abdominal hysterectomy     BP 142/86  Ht 5\' 4"  (1.626 m)  Wt 171 lb (77.565 kg)  BMI 29.34 kg/m2  General appearance is normal. Grooming and hygiene are normal. Nutritional status grossly normal no deformities.  Chest normal pulse and perfusion to the right lower extremity with no edema in the right leg. There is no evidence of lymphadenopathy or lymphangitis. She ambulates with a slight limp favoring the right lower extremity and she has an extremely flat foot. The foot has multiple calluses corns and bunions deformity. She has a valgus heel. She has tenderness and swelling over the posterior tibial tendon with normal dorsiflexion of the foot, ankle joint stability normal. Weakness of the posterior tibial tendon no atrophy in the foot skin is normal. She is oriented to time person and place her mood and affect are  normal.  The left lower extremity is benign and inspection and palpation revealed no malalignment or tenderness. No contractures instability subluxation laxity atrophy or strength deficits skin intact normal. Sensation normal as well.  The x-ray shows midfoot arthrosis  Clinical exam shows posterior tibial tendon dysfunction  Recommend Cam Walker and anti-inflammatories for 6 weeks return if no improvement recommend physical therapy and further weightbearing restrictions  PTTD (posterior tibial tendon dysfunction) - Plan: diclofenac (CATAFLAM) 50 MG tablet

## 2012-09-01 ENCOUNTER — Ambulatory Visit (INDEPENDENT_AMBULATORY_CARE_PROVIDER_SITE_OTHER): Payer: Medicare Other | Admitting: Orthopedic Surgery

## 2012-09-01 ENCOUNTER — Encounter: Payer: Self-pay | Admitting: Orthopedic Surgery

## 2012-09-01 VITALS — BP 148/84 | Ht 64.0 in | Wt 171.0 lb

## 2012-09-01 DIAGNOSIS — M6789 Other specified disorders of synovium and tendon, multiple sites: Secondary | ICD-10-CM

## 2012-09-01 DIAGNOSIS — M76829 Posterior tibial tendinitis, unspecified leg: Secondary | ICD-10-CM

## 2012-09-01 NOTE — Patient Instructions (Signed)
As tolerated

## 2012-09-01 NOTE — Progress Notes (Signed)
Patient ID: Alexis Davis, female   DOB: 06/14/1940, 72 y.o.   MRN: [id number] Chief Complaint  Patient presents with  . Follow-up    6 week recheck right foot s/p boot  She has tenderness and swelling over the posterior tibial tendon with normal dorsiflexion of the foot, ankle joint stability normal. Weakness of the posterior tibial tendon no atrophy in the foot skin is normal.  Foot Pain        Right foot pain     History 72 year-old female started having pain in the liver foot about 3 months ago denies any trauma. She complains of sharp 4/10 pain which is worse when she is up on her feet for while better with rest. She's also noticed some swelling she started had an x-ray shows midfoot arthrosis she denies any previous treatment  She has tenderness and swelling over the posterior tibial tendon with normal dorsiflexion of the foot, ankle joint stability normal. Weakness of the posterior tibial tendon no atrophy in the foot skin is normal.   Recheck: improved, no pain  ROS normal   PE:  BP 148/84  Ht 5\' 4"  (1.626 m)  Wt 171 lb (77.565 kg)  BMI 29.34 kg/m2 General appearance is normal, the patient is alert and oriented x3 with normal mood and affect. Right foot is flat non tender no swelling  Stable ankle   DX: PTTD resolved   Plan activities as tolerated

## 2012-12-09 ENCOUNTER — Ambulatory Visit (INDEPENDENT_AMBULATORY_CARE_PROVIDER_SITE_OTHER): Payer: Medicare Other | Admitting: Family Medicine

## 2012-12-09 ENCOUNTER — Encounter: Payer: Self-pay | Admitting: Family Medicine

## 2012-12-09 VITALS — BP 138/90 | HR 60 | Temp 98.0°F | Resp 20 | Ht 63.0 in | Wt 172.0 lb

## 2012-12-09 DIAGNOSIS — M8580 Other specified disorders of bone density and structure, unspecified site: Secondary | ICD-10-CM

## 2012-12-09 DIAGNOSIS — J449 Chronic obstructive pulmonary disease, unspecified: Secondary | ICD-10-CM

## 2012-12-09 DIAGNOSIS — M810 Age-related osteoporosis without current pathological fracture: Secondary | ICD-10-CM | POA: Insufficient documentation

## 2012-12-09 DIAGNOSIS — Z23 Encounter for immunization: Secondary | ICD-10-CM

## 2012-12-09 DIAGNOSIS — L299 Pruritus, unspecified: Secondary | ICD-10-CM | POA: Insufficient documentation

## 2012-12-09 DIAGNOSIS — M899 Disorder of bone, unspecified: Secondary | ICD-10-CM

## 2012-12-09 DIAGNOSIS — I10 Essential (primary) hypertension: Secondary | ICD-10-CM

## 2012-12-09 NOTE — Assessment & Plan Note (Signed)
Continue calcium , Bone density due, last in 2010

## 2012-12-09 NOTE — Assessment & Plan Note (Signed)
intial BP a little high but for her age, normal parameters continue HCTZ

## 2012-12-09 NOTE — Patient Instructions (Signed)
Mammogram Bone Density Flu shot given  Try a claritin pill if you itch  Continue medications F/U 6 months

## 2012-12-09 NOTE — Assessment & Plan Note (Signed)
No rash seen, ? Allergy, okay to take benaryl, can also try OTC claritin

## 2012-12-09 NOTE — Addendum Note (Signed)
Addended by: Donne Anon on: 12/09/2012 09:48 AM   Modules accepted: Orders

## 2012-12-09 NOTE — Assessment & Plan Note (Signed)
Doing well, rare use of albuterol Flu shot given

## 2012-12-09 NOTE — Progress Notes (Signed)
  Subjective:    Patient ID: Alexis Davis, female    DOB: 03/17/1940, 72 y.o.   MRN: [id number]  HPI  Pt here to f/u chronic medical problems. Has had a few episodes of itching all over, no rash associated. Happens randomly but she thinks it may be related to some foods she eats. Takes a benadryl which resolves symptoms.  Request Mammogram, also due for Bone Density She plans to go visit her Son in [place] for a month or so.  Quit tobacco in July    Review of Systems  GEN- denies fatigue, fever, weight loss,weakness, recent illness HEENT- denies eye drainage, change in vision, nasal discharge, CVS- denies chest pain, palpitations RESP- denies SOB, cough, wheeze ABD- denies N/V, change in stools, abd pain GU- denies dysuria, hematuria, dribbling, incontinence MSK- denies joint pain, muscle aches, injury Neuro- denies headache, dizziness, syncope, seizure activity      Objective:   Physical Exam  GEN- NAD, alert and oriented x3 HEENT- PERRL, EOMI, non injected sclera, pink conjunctiva, MMM, oropharynx clear CVS- RRR, no murmur RESP-CTAB ABD-NABS,soft,NT,ND EXT- No edema Pulses- Radial, DP- 2+ Skin- in tact, no rash      Assessment & Plan:

## 2012-12-20 ENCOUNTER — Ambulatory Visit (HOSPITAL_COMMUNITY)
Admission: RE | Admit: 2012-12-20 | Discharge: 2012-12-20 | Disposition: A | Discharge status: Home / Self Care | Payer: Medicare Other | Source: Ambulatory Visit | Attending: Family Medicine | Admitting: Family Medicine

## 2012-12-20 DIAGNOSIS — M8580 Other specified disorders of bone density and structure, unspecified site: Secondary | ICD-10-CM

## 2012-12-20 DIAGNOSIS — M818 Other osteoporosis without current pathological fracture: Secondary | ICD-10-CM | POA: Insufficient documentation

## 2012-12-26 ENCOUNTER — Other Ambulatory Visit: Payer: Self-pay | Admitting: Family Medicine

## 2012-12-26 MED ORDER — ALENDRONATE SODIUM 70 MG PO TABS: 70.0000 mg | ORAL_TABLET | ORAL | Status: DC

## 2013-06-09 ENCOUNTER — Ambulatory Visit (INDEPENDENT_AMBULATORY_CARE_PROVIDER_SITE_OTHER): Payer: Medicare Other | Admitting: Family Medicine

## 2013-06-09 ENCOUNTER — Encounter: Payer: Self-pay | Admitting: Family Medicine

## 2013-06-09 VITALS — BP 144/86 | HR 68 | Temp 97.5°F | Resp 14 | Ht 63.0 in | Wt 179.0 lb

## 2013-06-09 DIAGNOSIS — Z23 Encounter for immunization: Secondary | ICD-10-CM

## 2013-06-09 DIAGNOSIS — I10 Essential (primary) hypertension: Secondary | ICD-10-CM

## 2013-06-09 DIAGNOSIS — J449 Chronic obstructive pulmonary disease, unspecified: Secondary | ICD-10-CM

## 2013-06-09 LAB — CBC WITH DIFFERENTIAL/PLATELET
BASOS PCT: 0 % (ref 0–1)
Basophils Absolute: 0 10*3/uL (ref 0.0–0.1)
Eosinophils Absolute: 0.1 10*3/uL (ref 0.0–0.7)
Eosinophils Relative: 1 % (ref 0–5)
HEMATOCRIT: 42 % (ref 36.0–46.0)
Hemoglobin: 14.1 g/dL (ref 12.0–15.0)
LYMPHS PCT: 27 % (ref 12–46)
Lymphs Abs: 2.6 10*3/uL (ref 0.7–4.0)
MCH: 29.7 pg (ref 26.0–34.0)
MCHC: 33.6 g/dL (ref 30.0–36.0)
MCV: 88.4 fL (ref 78.0–100.0)
MONO ABS: 0.9 10*3/uL (ref 0.1–1.0)
MONOS PCT: 9 % (ref 3–12)
NEUTROS ABS: 6.1 10*3/uL (ref 1.7–7.7)
Neutrophils Relative %: 63 % (ref 43–77)
Platelets: 214 10*3/uL (ref 150–400)
RBC: 4.75 MIL/uL (ref 3.87–5.11)
RDW: 13.6 % (ref 11.5–15.5)
WBC: 9.7 10*3/uL (ref 4.0–10.5)

## 2013-06-09 LAB — COMPREHENSIVE METABOLIC PANEL
ALBUMIN: 3.7 g/dL (ref 3.5–5.2)
ALT: 11 U/L (ref 0–35)
AST: 17 U/L (ref 0–37)
Alkaline Phosphatase: 93 U/L (ref 39–117)
BUN: 18 mg/dL (ref 6–23)
CHLORIDE: 106 meq/L (ref 96–112)
CO2: 25 mEq/L (ref 19–32)
Calcium: 9.6 mg/dL (ref 8.4–10.5)
Creat: 0.65 mg/dL (ref 0.50–1.10)
GLUCOSE: 98 mg/dL (ref 70–99)
POTASSIUM: 4.7 meq/L (ref 3.5–5.3)
Sodium: 138 mEq/L (ref 135–145)
Total Bilirubin: 0.5 mg/dL (ref 0.2–1.2)
Total Protein: 6.2 g/dL (ref 6.0–8.3)

## 2013-06-09 LAB — LIPID PANEL
Cholesterol: 171 mg/dL (ref 0–200)
HDL: 54 mg/dL (ref >39)
LDL Cholesterol: 104 mg/dL — ABNORMAL HIGH (ref 0–99)
TRIGLYCERIDES: 63 mg/dL (ref <150)
Total CHOL/HDL Ratio: 3.2 Ratio
VLDL: 13 mg/dL (ref 0–40)

## 2013-06-09 NOTE — Patient Instructions (Signed)
Prenvar 13 pneumonia booster given We will send a letter with lab results Get eye exam Continue current medications F/U 6 month-- MEDICARE PHYSICAL

## 2013-06-09 NOTE — Assessment & Plan Note (Signed)
Blood pressure is well-controlled specially for her age and any recommendations no change the hydrochlorothiazide.

## 2013-06-09 NOTE — Progress Notes (Signed)
Patient ID: Alexis Davis, female   DOB: [birth year], 73 y.o.   MRN: [id number]   Subjective:    Patient ID: Alexis Davis, female    DOB: [birth year], 73 y.o.   MRN: [id number]  Patient presents for 6 month F/U  patient here to follow chronic medical problems. She has no specific concerns. Her breathing has done well through the winter she has mild COPD she's not used any inhalers. She's taken her blood pressure medication as prescribed. She's not had any difficulties with her joints recently therefore she's not using the diclofenac. She was diagnosed with osteopenia and is on Fosamax without any difficulties.     Review Of Systems:  GEN- denies fatigue, fever, weight loss,weakness, recent illness HEENT- denies eye drainage, change in vision, nasal discharge, CVS- denies chest pain, palpitations RESP- denies SOB, cough, wheeze ABD- denies N/V, change in stools, abd pain GU- denies dysuria, hematuria, dribbling, incontinence MSK- denies joint pain, muscle aches, injury Neuro- denies headache, dizziness, syncope, seizure activity       Objective:    BP 144/86  Pulse 68  Temp(Src) 97.5 F (36.4 C) (Oral)  Resp 14  Ht 5\' 3"  (1.6 m)  Wt 179 lb (81.194 kg)  BMI 31.72 kg/m2 GEN- NAD, alert and oriented x3 HEENT- PERRL, EOMI, non injected sclera, pink conjunctiva, MMM, oropharynx clear Neck- Supple,  CVS- RRR, no murmur RESP-CTAB EXT- No edema Pulses- Radial, DP- 2+        Assessment & Plan:      Problem List Items Addressed This Visit   ESSENTIAL HYPERTENSION, BENIGN - Primary   Relevant Orders      CBC with Differential      Comprehensive metabolic panel      Lipid panel      Note: This dictation was prepared with Dragon dictation along with smaller phrase technology. Any transcriptional errors that result from this process are unintentional.

## 2013-06-09 NOTE — Assessment & Plan Note (Signed)
She's very mild COPD. She does have not been overall but has not required this. She's not been on maintenance inhalers in many years.

## 2013-06-12 ENCOUNTER — Encounter: Payer: Self-pay | Admitting: *Deleted

## 2013-07-12 LAB — HM COLONOSCOPY: HM Colonoscopy: NORMAL

## 2013-07-26 ENCOUNTER — Telehealth: Payer: Self-pay | Admitting: *Deleted

## 2013-07-26 ENCOUNTER — Encounter: Payer: Self-pay | Admitting: Family Medicine

## 2013-07-26 NOTE — Telephone Encounter (Signed)
Received letter from [place] thru [place] with pts colorectal screening, came back NORMAL.Any questions contact [facility] [phone].

## 2013-08-28 ENCOUNTER — Ambulatory Visit (INDEPENDENT_AMBULATORY_CARE_PROVIDER_SITE_OTHER): Payer: Medicare Other | Admitting: Physician Assistant

## 2013-08-28 ENCOUNTER — Encounter: Payer: Self-pay | Admitting: Physician Assistant

## 2013-08-28 VITALS — BP 152/100 | HR 60 | Temp 97.9°F | Resp 18 | Wt 178.0 lb

## 2013-08-28 DIAGNOSIS — J988 Other specified respiratory disorders: Secondary | ICD-10-CM

## 2013-08-28 DIAGNOSIS — A499 Bacterial infection, unspecified: Secondary | ICD-10-CM

## 2013-08-28 DIAGNOSIS — B9689 Other specified bacterial agents as the cause of diseases classified elsewhere: Principal | ICD-10-CM

## 2013-08-28 MED ORDER — AZITHROMYCIN 250 MG PO TABS: ORAL_TABLET | ORAL | Status: DC

## 2013-08-28 NOTE — Progress Notes (Signed)
    Patient ID: Alexis Davis MRN: [id number], DOB: May 28, 1940, 73 y.o. Date of Encounter: 08/28/2013, 12:37 PM    Chief Complaint:  Chief Complaint  Patient presents with  . congested cough x 3 weeks     HPI: 73 y.o. year old female said this started out with some sore throat then it started to drain down into her chest. Says that she has a congested cough but is unable to get the phlegm out. Does blow her nose and is able to get out thick yellow-green mucus. Feels the congestion in the chest is worse than the head and nose.     Home Meds:   Outpatient Prescriptions Prior to Visit  Medication Sig Dispense Refill  . alendronate (FOSAMAX) 70 MG tablet Take 1 tablet (70 mg total) by mouth every 7 (seven) days. Take with a full glass of water on an empty stomach.  4 tablet  11  . Calcium Carbonate-Vitamin D (CALTRATE 600+D) 600-400 MG-UNIT per tablet Take 1 tablet by mouth 2 (two) times daily.  603 tablet  3  . COD LIVER OIL PO Take by mouth. 4 tablets daily      . diclofenac (CATAFLAM) 50 MG tablet Take 1 tablet (50 mg total) by mouth 2 (two) times daily.  90 tablet  0  . hydrochlorothiazide (HYDRODIURIL) 25 MG tablet Take 1 tablet (25 mg total) by mouth daily.  30 tablet  11  . albuterol (PROVENTIL HFA;VENTOLIN HFA) 108 (90 BASE) MCG/ACT inhaler Inhale 2 puffs into the lungs every 6 (six) hours as needed for wheezing or shortness of breath.  1 Inhaler  3   No facility-administered medications prior to visit.    Allergies: No Known Allergies    Review of Systems: See HPI for pertinent ROS. All other ROS negative.    Physical Exam: Blood pressure 152/100, pulse 60, temperature 97.9 F (36.6 C), temperature source Oral, resp. rate 18, weight 178 lb (80.74 kg)., Body mass index is 31.54 kg/(m^2). General: WNWD AAF.  Appears in no acute distress. HEENT: Normocephalic, atraumatic, eyes without discharge, sclera non-icteric, nares are without discharge. Bilateral auditory canals  clear, TM's are without perforation, pearly grey and translucent with reflective cone of light bilaterally. Oral cavity moist, posterior pharynx without exudate, erythema, peritonsillar abscess. No tenderness with percussion of frontal or maxillary sinuses bilaterally.  Neck: Supple. No thyromegaly. No lymphadenopathy. Lungs: Clear bilaterally to auscultation without wheezes, rales, or rhonchi. Breathing is unlabored. Heart: Regular rhythm. No murmurs, rubs, or gallops. Msk:  Strength and tone normal for age. Extremities/Skin: Warm and dry. Neuro: Alert and oriented X 3. Moves all extremities spontaneously. Gait is normal. CNII-XII grossly in tact. Psych:  Responds to questions appropriately with a normal affect.     ASSESSMENT AND PLAN:  73 y.o. year old female with  1. Bacterial respiratory infection - azithromycin (ZITHROMAX) 250 MG tablet; Day 1: Take 2 daily.  Days 2-5: Take 1 daily.  Dispense: 6 tablet; Refill: 0 She is to take antibiotic as directed. Recommend that she also use Mucinex DM as an expectorant and can use cough suppressants at night if needed. Follow up if symptoms do not resolve within one week after completion of antibiotic.  [address] [place], [place], [facility] 08/28/2013 12:37 PM

## 2013-11-03 ENCOUNTER — Ambulatory Visit (INDEPENDENT_AMBULATORY_CARE_PROVIDER_SITE_OTHER): Payer: Medicare Other | Admitting: Family Medicine

## 2013-11-03 ENCOUNTER — Encounter: Payer: Self-pay | Admitting: Family Medicine

## 2013-11-03 VITALS — BP 132/80 | HR 64 | Temp 97.9°F | Resp 12 | Ht 65.0 in | Wt 179.0 lb

## 2013-11-03 DIAGNOSIS — M159 Polyosteoarthritis, unspecified: Secondary | ICD-10-CM

## 2013-11-03 DIAGNOSIS — M8949 Other hypertrophic osteoarthropathy, multiple sites: Secondary | ICD-10-CM

## 2013-11-03 DIAGNOSIS — M1990 Unspecified osteoarthritis, unspecified site: Secondary | ICD-10-CM | POA: Insufficient documentation

## 2013-11-03 DIAGNOSIS — M76829 Posterior tibial tendinitis, unspecified leg: Secondary | ICD-10-CM

## 2013-11-03 DIAGNOSIS — M6789 Other specified disorders of synovium and tendon, multiple sites: Secondary | ICD-10-CM

## 2013-11-03 DIAGNOSIS — M150 Primary generalized (osteo)arthritis: Secondary | ICD-10-CM

## 2013-11-03 DIAGNOSIS — B353 Tinea pedis: Secondary | ICD-10-CM | POA: Insufficient documentation

## 2013-11-03 MED ORDER — DICLOFENAC POTASSIUM 50 MG PO TABS: 50.0000 mg | ORAL_TABLET | Freq: Two times a day (BID) | ORAL | Status: DC

## 2013-11-03 MED ORDER — TERBINAFINE HCL 1 % EX CREA: 1.0000 "application " | TOPICAL_CREAM | Freq: Two times a day (BID) | CUTANEOUS | Status: DC

## 2013-11-03 NOTE — Assessment & Plan Note (Signed)
I think her leg pain is likely arthritis related she also has some degenerative disc disease. As this is been more short term to try her on a course of anti-inflammatories all refill her diclofenac which she was on a year ago and did well with this for her lower leg in her ankle to take twice a day with food. Also encouraged her to increase water she can also take the juice for electrolytes. We've never had any hypokalemia with her as a cause of spasms.

## 2013-11-03 NOTE — Patient Instructions (Signed)
Take anti-inflammatory pills twice a day with food Cream for your foot Get your flu shot F/U as previous

## 2013-11-03 NOTE — Progress Notes (Signed)
Patient ID: Alexis Davis, female   DOB: 1940-11-12, 73 y.o.   MRN: [id number]   Subjective:    Patient ID: Alexis Davis, female    DOB: 1940/05/05, 73 y.o.   MRN: [id number]  Patient presents for Cramping/ Pain in R leg and Skin Irritation  patient here with right leg pain and cramping on and off for the past few weeks. She also gets pain in her right foot she has known osteoarthritis including her right foot which she saw orthopedics for about a year ago. She's not taken any over-the-counter medications. She said the cramping feels more like a toothache pain especially worse at nighttime. She denies any leg swelling. She denies any tingling or numbness in her extremities. She denies any radiating pain denies any back pain.  She's had a rash which has been itchy and spreading on her right foot for the past month. She states that her foot has began to peel as well. She's not tried any medications for this    Review Of Systems:  GEN- denies fatigue, fever, weight loss,weakness, recent illness HEENT- denies eye drainage, change in vision, nasal discharge, CVS- denies chest pain, palpitations RESP- denies SOB, cough, wheeze ABD- denies N/V, change in stools, abd pain GU- denies dysuria, hematuria, dribbling, incontinence MSK-+ joint pain, muscle aches, injury Neuro- denies headache, dizziness, syncope, seizure activity       Objective:    BP 132/80  Pulse 64  Temp(Src) 97.9 F (36.6 C) (Oral)  Resp 12  Ht 5\' 5"  (1.651 m)  Wt 179 lb (81.194 kg)  BMI 29.79 kg/m2 GEN- NAD, alert and oriented x3 CVS- RRR, no murmur RESP-CTAB ABD-NABS,soft,NT,ND MSK- Spine NT, good ROM, Hip- fair ROM, Knee- normal inspection, good ROM bilat, Right ankle fair ROM, no swelling noted Skin- peeling and thick scales on medial and lateral aspect of foot and sole of right foot, mild maceration between toes, no lesions on left foot EXT- No edema Pulses- Radial, DP- 2+        Assessment &  Plan:      Problem List Items Addressed This Visit   Tinea pedis   Relevant Medications      terbinafine (LAMISIL) 1 % cream   PTTD (posterior tibial tendon dysfunction) - Primary   Relevant Medications      diclofenac (CATAFLAM) tablet 50 mg   OA (osteoarthritis)   Relevant Medications      diclofenac (CATAFLAM) tablet 50 mg      Note: This dictation was prepared with Dragon dictation along with smaller phrase technology. Any transcriptional errors that result from this process are unintentional.

## 2013-11-03 NOTE — Assessment & Plan Note (Signed)
Will apply terbinafine cream twice a day she will moisturize

## 2013-11-13 ENCOUNTER — Encounter: Payer: Self-pay | Admitting: Family Medicine

## 2013-11-13 ENCOUNTER — Ambulatory Visit (INDEPENDENT_AMBULATORY_CARE_PROVIDER_SITE_OTHER): Payer: Medicare Other | Admitting: Family Medicine

## 2013-11-13 VITALS — BP 130/74 | HR 62 | Temp 97.6°F | Resp 14 | Ht 62.5 in | Wt 175.0 lb

## 2013-11-13 DIAGNOSIS — J209 Acute bronchitis, unspecified: Secondary | ICD-10-CM

## 2013-11-13 MED ORDER — ALBUTEROL SULFATE 108 (90 BASE) MCG/ACT IN AEPB: 2.0000 | INHALATION_SPRAY | RESPIRATORY_TRACT | Status: DC | PRN

## 2013-11-13 MED ORDER — AZITHROMYCIN 250 MG PO TABS: ORAL_TABLET | ORAL | Status: DC

## 2013-11-13 MED ORDER — CLOTRIMAZOLE-BETAMETHASONE 1-0.05 % EX CREA: 1.0000 "application " | TOPICAL_CREAM | Freq: Two times a day (BID) | CUTANEOUS | Status: DC

## 2013-11-13 NOTE — Patient Instructions (Signed)
Take antibiotics as prescribed Use inhaler as prescribed F/U as previous

## 2013-11-13 NOTE — Assessment & Plan Note (Signed)
Acute bronchitis in setting of mild COPD. I have given her Pro-Air Respiclick from the office to use as needed, continue mucinex, Zpak to cover de to COPD

## 2013-11-13 NOTE — Progress Notes (Signed)
Patient ID: Alexis Davis, female   DOB: 09/15/1940, 73 y.o.   MRN: [id number]   Subjective:    Patient ID: Alexis Davis, female    DOB: 1940-03-23, 73 y.o.   MRN: [id number]  Patient presents for Illness  patient here with cough with production wheezing sore throat low-grade fever for the past week. She states it is progressively getting worse. She does have history of mild COPD but not on any maintenance inhalers. She does not have albuterol at home because of the cost. She has been using Mucinex DM which helps. She is a previous tobacco user.  Requested refill on lotrisone cream Review Of Systems:  GEN- denies fatigue, fever, weight loss,weakness, recent illness HEENT- denies eye drainage, change in vision, nasal discharge, CVS- denies chest pain, palpitations RESP- denies SOB, +cough, +wheeze ABD- denies N/V, change in stools, abd pain GU- denies dysuria, hematuria, dribbling, incontinence MSK- denies joint pain, muscle aches, injury Neuro- denies headache, dizziness, syncope, seizure activity       Objective:    BP 130/74  Pulse 62  Temp(Src) 97.6 F (36.4 C) (Oral)  Resp 14  Ht 5' 2.5" (1.588 m)  Wt 175 lb (79.379 kg)  BMI 31.48 kg/m2 GEN- NAD, alert and oriented x3 HEENT- PERRL, EOMI, non injected sclera, pink conjunctiva, MMM, oropharynx clear Neck- Supple, no LAD CVS- RRR, no murmur RESP-scattered bilat wheeze,mild rhonchi clears with cough, normal WOB EXT- No edema Pulses- Radial, DP- 2+        Assessment & Plan:      Problem List Items Addressed This Visit   Acute bronchitis - Primary      Note: This dictation was prepared with Dragon dictation along with smaller phrase technology. Any transcriptional errors that result from this process are unintentional.

## 2013-11-21 ENCOUNTER — Telehealth: Payer: Self-pay | Admitting: Family Medicine

## 2013-11-21 MED ORDER — PREDNISONE 20 MG PO TABS: 40.0000 mg | ORAL_TABLET | Freq: Every day | ORAL | Status: DC

## 2013-11-21 NOTE — Telephone Encounter (Signed)
Patient seen in office for COPD and given Z-pack.   Call returned to patient. States that ABTx did help some, but she still has has congestion in chest. States that she is using Mucinex and drinking fluids to try to thin mucus.   MD please advise.

## 2013-11-21 NOTE — Telephone Encounter (Signed)
Cindy and prednisone 40 mg by mouth daily x5 days continue the Mucinex and fluids

## 2013-11-21 NOTE — Telephone Encounter (Signed)
Prescription sent to pharmacy.  Call placed to patient. Line noted busy.

## 2013-11-21 NOTE — Telephone Encounter (Signed)
Patient says the antibiotic that dr Buelah Manis put her on is not working at all would like to know if we can call something else in  [phone]

## 2013-11-22 NOTE — Telephone Encounter (Signed)
Call placed to patient and patient husband, Alexis Davis made aware.

## 2013-12-11 ENCOUNTER — Ambulatory Visit: Payer: Medicare Other | Admitting: Family Medicine

## 2013-12-11 ENCOUNTER — Encounter: Payer: Self-pay | Admitting: Family Medicine

## 2013-12-11 ENCOUNTER — Ambulatory Visit (INDEPENDENT_AMBULATORY_CARE_PROVIDER_SITE_OTHER): Payer: Medicare Other | Admitting: Family Medicine

## 2013-12-11 VITALS — BP 144/80 | HR 72 | Temp 97.9°F | Resp 16 | Ht 64.0 in | Wt 178.0 lb

## 2013-12-11 DIAGNOSIS — I10 Essential (primary) hypertension: Secondary | ICD-10-CM

## 2013-12-11 DIAGNOSIS — Z1239 Encounter for other screening for malignant neoplasm of breast: Secondary | ICD-10-CM

## 2013-12-11 DIAGNOSIS — M8580 Other specified disorders of bone density and structure, unspecified site: Secondary | ICD-10-CM

## 2013-12-11 DIAGNOSIS — Z0000 Encounter for general adult medical examination without abnormal findings: Secondary | ICD-10-CM

## 2013-12-11 LAB — CBC WITH DIFFERENTIAL/PLATELET
BASOS ABS: 0 10*3/uL (ref 0.0–0.1)
Basophils Relative: 0 % (ref 0–1)
EOS PCT: 1 % (ref 0–5)
Eosinophils Absolute: 0.1 10*3/uL (ref 0.0–0.7)
HEMATOCRIT: 42.1 % (ref 36.0–46.0)
Hemoglobin: 14.1 g/dL (ref 12.0–15.0)
LYMPHS ABS: 3 10*3/uL (ref 0.7–4.0)
LYMPHS PCT: 34 % (ref 12–46)
MCH: 30.1 pg (ref 26.0–34.0)
MCHC: 33.5 g/dL (ref 30.0–36.0)
MCV: 90 fL (ref 78.0–100.0)
MONO ABS: 0.7 10*3/uL (ref 0.1–1.0)
Monocytes Relative: 8 % (ref 3–12)
Neutro Abs: 5.1 10*3/uL (ref 1.7–7.7)
Neutrophils Relative %: 57 % (ref 43–77)
PLATELETS: 207 10*3/uL (ref 150–400)
RBC: 4.68 MIL/uL (ref 3.87–5.11)
RDW: 13.7 % (ref 11.5–15.5)
WBC: 8.9 10*3/uL (ref 4.0–10.5)

## 2013-12-11 LAB — LIPID PANEL
CHOLESTEROL: 162 mg/dL (ref 0–200)
HDL: 48 mg/dL (ref >39)
LDL Cholesterol: 83 mg/dL (ref 0–99)
Total CHOL/HDL Ratio: 3.4 Ratio
Triglycerides: 156 mg/dL — ABNORMAL HIGH (ref <150)
VLDL: 31 mg/dL (ref 0–40)

## 2013-12-11 LAB — COMPREHENSIVE METABOLIC PANEL
ALBUMIN: 3.8 g/dL (ref 3.5–5.2)
ALT: 12 U/L (ref 0–35)
AST: 16 U/L (ref 0–37)
Alkaline Phosphatase: 102 U/L (ref 39–117)
BUN: 14 mg/dL (ref 6–23)
CALCIUM: 9.6 mg/dL (ref 8.4–10.5)
CHLORIDE: 107 meq/L (ref 96–112)
CO2: 25 mEq/L (ref 19–32)
Creat: 0.6 mg/dL (ref 0.50–1.10)
Glucose, Bld: 100 mg/dL — ABNORMAL HIGH (ref 70–99)
POTASSIUM: 4.8 meq/L (ref 3.5–5.3)
Sodium: 141 mEq/L (ref 135–145)
TOTAL PROTEIN: 6 g/dL (ref 6.0–8.3)
Total Bilirubin: 0.3 mg/dL (ref 0.2–1.2)

## 2013-12-11 NOTE — Progress Notes (Signed)
Patient ID: Alexis Davis, female   DOB: 1940/07/09, 73 y.o.   MRN: [id number] Subjective:   Patient presents for Medicare Annual/Subsequent preventive examination.   Review Past Medical/Family/Social: Per EMR   No specific concerns. Due for repeat Bone Density on Fosamax and calcium Vitamin D  Would like Mammogram  Does not want to pursue Colonoscopy at this time  Risk Factors  Current exercise habits: walks daily Dietary issues discussed: yes  Cardiac risk factors: Obesity (BMI >= 30 kg/m2). ,HTN  Depression Screen  (Note: if answer to either of the following is "Yes", a more complete depression screening is indicated)  Over the past two weeks, have you felt down, depressed or hopeless? No Over the past two weeks, have you felt little interest or pleasure in doing things? No Have you lost interest or pleasure in daily life? No Do you often feel hopeless? No Do you cry easily over simple problems? No   Activities of Daily Living  In your present state of health, do you have any difficulty performing the following activities?:  Driving? No  Managing money? No  Feeding yourself? No  Getting from bed to chair? No  Climbing a flight of stairs? No  Preparing food and eating?: No  Bathing or showering? No  Getting dressed: No  Getting to the toilet? No  Using the toilet:No  Moving around from place to place: No  In the past year have you fallen or had a near fall?:No  Are you sexually active? DID NOT ANSWER Do you have more than one partner? No   Hearing Difficulties: No  Do you often ask people to speak up or repeat themselves? No  Do you experience ringing or noises in your ears? No Do you have difficulty understanding soft or whispered voices? No  Do you feel that you have a problem with memory? No Do you often misplace items? No  Do you feel safe at home? Yes  Cognitive Testing  Alert? Yes Normal Appearance?Yes  Oriented to person? Yes Place? Yes  Time? Yes   Recall of three objects? Yes  Can perform simple calculations? Yes  Displays appropriate judgment?Yes  Can read the correct time from a watch face?Yes   List the Names of Other Physician/Practitioners you currently use:   None currently  Screening Tests / Date Colonoscopy  - Due, had fecal occult in May 2015                   Zostavax - declines Mammogram - scheduled Influenza Vaccine - Declines Tetanus/tdap- Declines    GEN- denies fatigue, fever, weight loss,weakness, recent illness HEENT- denies eye drainage, change in vision, nasal discharge, CVS- denies chest pain, palpitations RESP- denies SOB, cough, wheeze ABD- denies N/V, change in stools, abd pain GU- denies dysuria, hematuria, dribbling, incontinence MSK- + joint pain, muscle aches, injury Neuro- denies headache, dizziness, syncope, seizure activity   PHYSICAL: GEN- NAD, alert and oriented x3 HEENT- PERRL, EOMI, non injected sclera, pink conjunctiva, MMM, oropharynx clear Neck- Supple, no thryomegaly, no Bruit CVS- RRR, no murmur RESP-CTAB EXT- No edema Pulses- Radial, DP- 2+   Assessment:    Annual wellness medicare exam   Plan:    During the course of the visit the patient was educated and counseled about appropriate screening and preventive services including:  Screening mammography  Colorectal cancer screening   Diet review for nutrition referral? Yes ____ Not Indicated __x__  Patient Instructions (the written plan) was given to the  patient.  Medicare Attestation  I have personally reviewed:  The patient's medical and social history  Their use of alcohol, tobacco or illicit drugs  Their current medications and supplements  The patient's functional ability including ADLs,fall risks, home safety risks, cognitive, and hearing and visual impairment  Diet and physical activities  Evidence for depression or mood disorders  The patient's weight, height, BMI, and visual acuity have been recorded in  the chart. I have made referrals, counseling, and provided education to the patient based on review of the above and I have provided the patient with a written personalized care plan for preventive services.

## 2013-12-11 NOTE — Assessment & Plan Note (Signed)
Bone Density.

## 2013-12-11 NOTE — Assessment & Plan Note (Signed)
BP elevated some today, pt has not been taking meds daily, discussed importance

## 2013-12-11 NOTE — Patient Instructions (Signed)
Mammogram and Bone Density test We will cal for labs Call if want colonoscopy set up TYlenol arthritis  We will call with your labs  F/U 6 months

## 2013-12-12 ENCOUNTER — Encounter: Payer: Self-pay | Admitting: *Deleted

## 2013-12-13 ENCOUNTER — Encounter: Payer: Self-pay | Admitting: *Deleted

## 2013-12-18 ENCOUNTER — Ambulatory Visit (HOSPITAL_COMMUNITY)
Admission: RE | Admit: 2013-12-18 | Discharge: 2013-12-18 | Disposition: A | Discharge status: Home / Self Care | Payer: Medicare Other | Source: Ambulatory Visit | Attending: Family Medicine | Admitting: Family Medicine

## 2013-12-18 DIAGNOSIS — Z1231 Encounter for screening mammogram for malignant neoplasm of breast: Secondary | ICD-10-CM | POA: Diagnosis not present

## 2013-12-18 DIAGNOSIS — Z1239 Encounter for other screening for malignant neoplasm of breast: Secondary | ICD-10-CM

## 2013-12-20 ENCOUNTER — Ambulatory Visit (HOSPITAL_COMMUNITY): Payer: Medicare Other

## 2014-01-16 ENCOUNTER — Other Ambulatory Visit: Payer: Self-pay | Admitting: Family Medicine

## 2014-03-16 ENCOUNTER — Encounter: Payer: Self-pay | Admitting: Family Medicine

## 2014-03-16 ENCOUNTER — Ambulatory Visit (INDEPENDENT_AMBULATORY_CARE_PROVIDER_SITE_OTHER): Payer: Medicare Other | Admitting: Family Medicine

## 2014-03-16 VITALS — BP 130/68 | HR 72 | Temp 97.7°F | Resp 16 | Ht 64.0 in | Wt 183.0 lb

## 2014-03-16 DIAGNOSIS — I868 Varicose veins of other specified sites: Secondary | ICD-10-CM | POA: Diagnosis not present

## 2014-03-16 DIAGNOSIS — I8390 Asymptomatic varicose veins of unspecified lower extremity: Secondary | ICD-10-CM | POA: Insufficient documentation

## 2014-03-16 DIAGNOSIS — M6789 Other specified disorders of synovium and tendon, multiple sites: Secondary | ICD-10-CM | POA: Diagnosis not present

## 2014-03-16 DIAGNOSIS — M76829 Posterior tibial tendinitis, unspecified leg: Secondary | ICD-10-CM

## 2014-03-16 MED ORDER — DICLOFENAC POTASSIUM 50 MG PO TABS: 50.0000 mg | ORAL_TABLET | Freq: Two times a day (BID) | ORAL | Status: DC

## 2014-03-16 NOTE — Progress Notes (Signed)
Patient ID: Alexis Davis, female   DOB: Jul 08, 1940, 74 y.o.   MRN: [id number]   Subjective:    Patient ID: Alexis Davis, female    DOB: Jun 08, 1940, 74 y.o.   MRN: [id number]  Patient presents for R Leg Pain  patient here with recurrent right lower leg to ankle pain. She was seen by orthopedics in 2014 she was diagnosed with posterior tibial tendon syndrome she was given diclofenac which she was on for the past few months as needed but states that she ran out of the medicine. For the past couple weeks she has had throbbing in her right leg she denies any swelling of the leg denies any knee pain or giving out denies any injury    Review Of Systems:  GEN- denies fatigue, fever, weight loss,weakness, recent illness HEENT- denies eye drainage, change in vision, nasal discharge, CVS- denies chest pain, palpitations RESP- denies SOB, cough, wheeze ABD- denies N/V, change in stools, abd pain GU- denies dysuria, hematuria, dribbling, incontinence MSK- + joint pain, muscle aches, injury Neuro- denies headache, dizziness, syncope, seizure activity       Objective:    BP 130/68 mmHg  Pulse 72  Temp(Src) 97.7 F (36.5 C) (Oral)  Resp 16  Ht 5\' 4"  (1.626 m)  Wt 183 lb (83.008 kg)  BMI 31.40 kg/m2 GEN- NAD, alert and oriented x3 MSK- Bilat knee normal inspection, fair ROM, no effusion, mild crepitus, Right lower leg, TTP to palpation of shin, small varicose vein mid tenderness, no erythema, Good ROM ankle EXT- No edema Pulses- Radial, DP- 2+        Assessment & Plan:      Problem List Items Addressed This Visit      Unprioritized   Varicose veins   PTTD (posterior tibial tendon dysfunction) - Primary     She states that she feels exact same pain she had previously. I will put her back on diclofenac twice a day if this does not improve I will refer her back to orthopedics. She does have some small varicose veins but does not seem classic for phlebitis at any rate  anti-inflammatories as well as heat will help      Relevant Medications   diclofenac (CATAFLAM) tablet 50 mg      Note: This dictation was prepared with Dragon dictation along with smaller phrase technology. Any transcriptional errors that result from this process are unintentional.

## 2014-03-16 NOTE — Patient Instructions (Signed)
Restart diclofenac twice a day  Try a heat to the leg  F/U as needed

## 2014-03-16 NOTE — Assessment & Plan Note (Signed)
She states that she feels exact same pain she had previously. I will put her back on diclofenac twice a day if this does not improve I will refer her back to orthopedics. She does have some small varicose veins but does not seem classic for phlebitis at any rate anti-inflammatories as well as heat will help

## 2014-03-29 ENCOUNTER — Other Ambulatory Visit: Payer: Self-pay | Admitting: Family Medicine

## 2014-03-29 NOTE — Telephone Encounter (Signed)
Medication refilled per protocol. 

## 2014-08-06 DIAGNOSIS — H2513 Age-related nuclear cataract, bilateral: Secondary | ICD-10-CM | POA: Diagnosis not present

## 2014-08-06 DIAGNOSIS — H40013 Open angle with borderline findings, low risk, bilateral: Secondary | ICD-10-CM | POA: Diagnosis not present

## 2014-08-06 DIAGNOSIS — H2511 Age-related nuclear cataract, right eye: Secondary | ICD-10-CM | POA: Diagnosis not present

## 2014-08-07 ENCOUNTER — Encounter: Payer: Self-pay | Admitting: Family Medicine

## 2014-08-17 NOTE — Patient Instructions (Signed)
Alexis Davis  08/17/2014        Your procedure is scheduled on 08/23/14  Report to [facility] Entr[facility] at 6:40 A.M.  Call this number if you have problems the morning of surgery:  [phone]   Remember:  Do not eat food or drink liquids after midnight.  Take these medicines the morning of surgery with A SIP OF WATER Dilofenac - (Albuterol inhaler and bring with you)   Do not wear jewelry, make-up or nail polish.  Do not wear lotions, powders, or perfumes.  You may wear deodorant.  Do not shave 48 hours prior to surgery.  Men may shave face and neck.  Do not bring valuables to the hospital.  [facility] is not responsible for any belongings or valuables.  Contacts, dentures or bridgework may not be worn into surgery.  Leave your suitcase in the car.  After surgery it may be brought to your room.  For patients admitted to the hospital, discharge time will be determined by your treatment team.  Patients discharged the day of surgery will not be allowed to drive home.    Please read over the following fact sheets that you were given. Anesthesia Post-op Instructions      PATIENT INSTRUCTIONS POST-ANESTHESIA  IMMEDIATELY FOLLOWING SURGERY:  Do not drive or operate machinery for the first twenty four hours after surgery.  Do not make any important decisions for twenty four hours after surgery or while taking narcotic pain medications or sedatives.  If you develop intractable nausea and vomiting or a severe headache please notify your doctor immediately.  FOLLOW-UP:  Please make an appointment with your surgeon as instructed. You do not need to follow up with anesthesia unless specifically instructed to do so.  WOUND CARE INSTRUCTIONS (if applicable):  Keep a dry clean dressing on the anesthesia/puncture wound site if there is drainage.  Once the wound has quit draining you may leave it open to air.  Generally you should leave the bandage intact for twenty four hours  unless there is drainage.  If the epidural site drains for more than 36-48 hours please call the anesthesia department.  QUESTIONS?:  Please feel free to call your physician or the hospital operator if you have any questions, and they will be happy to assist you.      Cataract A cataract is a clouding of the lens of the eye. When a lens becomes cloudy, vision is reduced based on the degree and nature of the clouding. Many cataracts reduce vision to some degree. Some cataracts make people more near-sighted as they develop. Other cataracts increase glare. Cataracts that are ignored and become worse can sometimes look white. The white color can be seen through the pupil. CAUSES   Aging. However, cataracts may occur at any age, even in newborns.  Certain drugs.  Trauma to the eye.  Certain diseases such as diabetes.  Specific eye diseases such as chronic inflammation inside the eye or a sudden attack of a rare form of glaucoma.  Inherited or acquired medical problems. SYMPTOMS   Gradual, progressive drop in vision in the affected eye.  Severe, rapid visual loss. This most often happens when trauma is the cause. DIAGNOSIS  To detect a cataract, an eye doctor examines the lens. Cataracts are best diagnosed with an exam of the eyes with the pupils enlarged (dilated) by drops.  TREATMENT  For an early cataract, vision may improve by using different eyeglasses or stronger lighting. If  that does not help your vision, surgery is the only effective treatment. A cataract needs to be surgically removed when vision loss interferes with your everyday activities, such as driving, reading, or watching TV. A cataract may also have to be removed if it prevents examination or treatment of another eye problem. Surgery removes the cloudy lens and usually replaces it with a substitute lens (intraocular lens, IOL).  At a time when both you and your doctor agree, the cataract will be surgically removed. If you  have cataracts in both eyes, only one is usually removed at a time. This allows the operated eye to heal and be out of danger from any possible problems after surgery (such as infection or poor wound healing). In rare cases, a cataract may be doing damage to your eye. In these cases, your caregiver may advise surgical removal right away. The vast majority of people who have cataract surgery have better vision afterward. HOME CARE INSTRUCTIONS  If you are not planning surgery, you may be asked to do the following:  Use different eyeglasses.  Use stronger or brighter lighting.  Ask your eye doctor about reducing your medicine dose or changing medicines if it is thought that a medicine caused your cataract. Changing medicines does not make the cataract go away on its own.  Become familiar with your surroundings. Poor vision can lead to injury. Avoid bumping into things on the affected side. You are at a higher risk for tripping or falling.  Exercise extreme care when driving or operating machinery.  Wear sunglasses if you are sensitive to bright light or experiencing problems with glare. SEEK IMMEDIATE MEDICAL CARE IF:   You have a worsening or sudden vision loss.  You notice redness, swelling, or increasing pain in the eye.  You have a fever. Document Released: 02/16/2005 Document Revised: 05/11/2011 Document Reviewed: 10/10/2010 [facility] Patient Information 2015 [place], [place]. This information is not intended to replace advice given to you by your health care provider. Make sure you discuss any questions you have with your health care provider.

## 2014-08-20 ENCOUNTER — Encounter (HOSPITAL_COMMUNITY)
Admission: RE | Admit: 2014-08-20 | Discharge: 2014-08-20 | Disposition: A | Discharge status: Home / Self Care | Payer: Medicare Other | Source: Ambulatory Visit | Attending: Ophthalmology | Admitting: Ophthalmology

## 2014-08-20 ENCOUNTER — Encounter (HOSPITAL_COMMUNITY): Payer: Self-pay

## 2014-08-20 DIAGNOSIS — J449 Chronic obstructive pulmonary disease, unspecified: Secondary | ICD-10-CM | POA: Diagnosis not present

## 2014-08-20 DIAGNOSIS — I10 Essential (primary) hypertension: Secondary | ICD-10-CM | POA: Diagnosis not present

## 2014-08-20 DIAGNOSIS — H269 Unspecified cataract: Secondary | ICD-10-CM | POA: Diagnosis present

## 2014-08-20 DIAGNOSIS — Z791 Long term (current) use of non-steroidal anti-inflammatories (NSAID): Secondary | ICD-10-CM | POA: Diagnosis not present

## 2014-08-20 DIAGNOSIS — H2511 Age-related nuclear cataract, right eye: Secondary | ICD-10-CM | POA: Diagnosis not present

## 2014-08-20 DIAGNOSIS — Z79899 Other long term (current) drug therapy: Secondary | ICD-10-CM | POA: Diagnosis not present

## 2014-08-20 DIAGNOSIS — Z87891 Personal history of nicotine dependence: Secondary | ICD-10-CM | POA: Diagnosis not present

## 2014-08-20 LAB — BASIC METABOLIC PANEL
ANION GAP: 8 (ref 5–15)
BUN: 7 mg/dL (ref 6–20)
CALCIUM: 9.4 mg/dL (ref 8.9–10.3)
CO2: 28 mmol/L (ref 22–32)
CREATININE: 0.57 mg/dL (ref 0.44–1.00)
Chloride: 104 mmol/L (ref 101–111)
GFR calc Af Amer: >60 mL/min (ref >60)
GFR calc non Af Amer: >60 mL/min (ref >60)
GLUCOSE: 90 mg/dL (ref 65–99)
POTASSIUM: 4.7 mmol/L (ref 3.5–5.1)
Sodium: 140 mmol/L (ref 135–145)

## 2014-08-20 LAB — CBC
HCT: 42.3 % (ref 36.0–46.0)
HEMOGLOBIN: 13.9 g/dL (ref 12.0–15.0)
MCH: 30.2 pg (ref 26.0–34.0)
MCHC: 32.9 g/dL (ref 30.0–36.0)
MCV: 91.8 fL (ref 78.0–100.0)
Platelets: 210 10*3/uL (ref 150–400)
RBC: 4.61 MIL/uL (ref 3.87–5.11)
RDW: 13.3 % (ref 11.5–15.5)
WBC: 7.9 10*3/uL (ref 4.0–10.5)

## 2014-08-22 MED ORDER — NEOMYCIN-POLYMYXIN-DEXAMETH 3.5-10000-0.1 OP SUSP
OPHTHALMIC | Status: AC
  Filled 2014-08-22: qty 5

## 2014-08-22 MED ORDER — LIDOCAINE HCL 3.5 % OP GEL
OPHTHALMIC | Status: AC
  Filled 2014-08-22: qty 1

## 2014-08-22 MED ORDER — CYCLOPENTOLATE-PHENYLEPHRINE OP SOLN OPTIME - NO CHARGE
OPHTHALMIC | Status: AC
  Filled 2014-08-22: qty 2

## 2014-08-22 MED ORDER — TETRACAINE HCL 0.5 % OP SOLN
OPHTHALMIC | Status: AC
  Filled 2014-08-22: qty 2

## 2014-08-22 MED ORDER — LIDOCAINE HCL (PF) 1 % IJ SOLN
INTRAMUSCULAR | Status: AC
  Filled 2014-08-22: qty 2

## 2014-08-22 MED ORDER — PHENYLEPHRINE HCL 2.5 % OP SOLN
OPHTHALMIC | Status: AC
  Filled 2014-08-22: qty 15

## 2014-08-23 ENCOUNTER — Ambulatory Visit (HOSPITAL_COMMUNITY)
Admission: RE | Admit: 2014-08-23 | Discharge: 2014-08-23 | Disposition: A | Discharge status: Home / Self Care | Payer: Medicare Other | Source: Ambulatory Visit | Attending: Ophthalmology | Admitting: Ophthalmology

## 2014-08-23 ENCOUNTER — Encounter (HOSPITAL_COMMUNITY)
Admission: RE | Disposition: A | Discharge status: Home / Self Care | Payer: Self-pay | Source: Ambulatory Visit | Attending: Ophthalmology

## 2014-08-23 ENCOUNTER — Encounter (HOSPITAL_COMMUNITY): Payer: Self-pay | Admitting: *Deleted

## 2014-08-23 ENCOUNTER — Ambulatory Visit (HOSPITAL_COMMUNITY): Payer: Medicare Other | Admitting: Anesthesiology

## 2014-08-23 DIAGNOSIS — Z791 Long term (current) use of non-steroidal anti-inflammatories (NSAID): Secondary | ICD-10-CM | POA: Diagnosis not present

## 2014-08-23 DIAGNOSIS — J449 Chronic obstructive pulmonary disease, unspecified: Secondary | ICD-10-CM | POA: Insufficient documentation

## 2014-08-23 DIAGNOSIS — H25811 Combined forms of age-related cataract, right eye: Secondary | ICD-10-CM | POA: Diagnosis not present

## 2014-08-23 DIAGNOSIS — H259 Unspecified age-related cataract: Secondary | ICD-10-CM | POA: Diagnosis not present

## 2014-08-23 DIAGNOSIS — Z79899 Other long term (current) drug therapy: Secondary | ICD-10-CM | POA: Insufficient documentation

## 2014-08-23 DIAGNOSIS — Z87891 Personal history of nicotine dependence: Secondary | ICD-10-CM | POA: Insufficient documentation

## 2014-08-23 DIAGNOSIS — H2511 Age-related nuclear cataract, right eye: Secondary | ICD-10-CM | POA: Insufficient documentation

## 2014-08-23 DIAGNOSIS — I10 Essential (primary) hypertension: Secondary | ICD-10-CM | POA: Diagnosis not present

## 2014-08-23 HISTORY — PX: CATARACT EXTRACTION W/PHACO: SHX586

## 2014-08-23 SURGERY — PHACOEMULSIFICATION, CATARACT, WITH IOL INSERTION
Anesthesia: Monitor Anesthesia Care | Site: Eye | Laterality: Right

## 2014-08-23 MED ORDER — TETRACAINE HCL 0.5 % OP SOLN
1.0000 [drp] | OPHTHALMIC | Status: AC
  Administered 2014-08-23 (×3): 1 [drp] via OPHTHALMIC

## 2014-08-23 MED ORDER — NEOMYCIN-POLYMYXIN-DEXAMETH 3.5-10000-0.1 OP SUSP
OPHTHALMIC | Status: DC | PRN
  Administered 2014-08-23: 1 [drp] via OPHTHALMIC

## 2014-08-23 MED ORDER — LACTATED RINGERS IV SOLN
INTRAVENOUS | Status: DC
  Administered 2014-08-23: 07:00:00 via INTRAVENOUS

## 2014-08-23 MED ORDER — BSS IO SOLN
INTRAOCULAR | Status: DC | PRN
  Administered 2014-08-23: 500 mL

## 2014-08-23 MED ORDER — FENTANYL CITRATE (PF) 100 MCG/2ML IJ SOLN
INTRAMUSCULAR | Status: AC
  Filled 2014-08-23: qty 2

## 2014-08-23 MED ORDER — LIDOCAINE HCL 3.5 % OP GEL
1.0000 "application " | Freq: Once | OPHTHALMIC | Status: AC
  Administered 2014-08-23: 1 via OPHTHALMIC

## 2014-08-23 MED ORDER — POVIDONE-IODINE 5 % OP SOLN
OPHTHALMIC | Status: DC | PRN
  Administered 2014-08-23: 1 via OPHTHALMIC

## 2014-08-23 MED ORDER — PHENYLEPHRINE HCL 2.5 % OP SOLN
1.0000 [drp] | OPHTHALMIC | Status: AC
  Administered 2014-08-23 (×3): 1 [drp] via OPHTHALMIC

## 2014-08-23 MED ORDER — LIDOCAINE HCL (PF) 1 % IJ SOLN
INTRAMUSCULAR | Status: DC | PRN
Start: 2014-08-23 — End: 2014-08-23
  Administered 2014-08-23: .6 mL

## 2014-08-23 MED ORDER — BSS IO SOLN
INTRAOCULAR | Status: DC | PRN
  Administered 2014-08-23: 15 mL via INTRAOCULAR

## 2014-08-23 MED ORDER — FENTANYL CITRATE (PF) 100 MCG/2ML IJ SOLN
25.0000 ug | INTRAMUSCULAR | Status: AC
  Administered 2014-08-23 (×2): 25 ug via INTRAVENOUS

## 2014-08-23 MED ORDER — CYCLOPENTOLATE-PHENYLEPHRINE 0.2-1 % OP SOLN
1.0000 [drp] | OPHTHALMIC | Status: AC
  Administered 2014-08-23 (×3): 1 [drp] via OPHTHALMIC

## 2014-08-23 MED ORDER — MIDAZOLAM HCL 2 MG/2ML IJ SOLN
INTRAMUSCULAR | Status: AC
  Filled 2014-08-23: qty 2

## 2014-08-23 MED ORDER — MIDAZOLAM HCL 2 MG/2ML IJ SOLN
1.0000 mg | INTRAMUSCULAR | Status: DC | PRN
  Administered 2014-08-23: 2 mg via INTRAVENOUS

## 2014-08-23 MED ORDER — PROVISC 10 MG/ML IO SOLN
INTRAOCULAR | Status: DC | PRN
Start: 2014-08-23 — End: 2014-08-23
  Administered 2014-08-23: 0.85 mL via INTRAOCULAR

## 2014-08-23 SURGICAL SUPPLY — 33 items
CAPSULAR TENSION RING-AMO (OPHTHALMIC RELATED) IMPLANT
CLOTH BEACON ORANGE TIMEOUT ST (SAFETY) ×1 IMPLANT
EYE SHIELD UNIVERSAL CLEAR (GAUZE/BANDAGES/DRESSINGS) ×1 IMPLANT
GLOVE BIO SURGEON STRL SZ 6.5 (GLOVE) IMPLANT
GLOVE BIOGEL PI IND STRL 6.5 (GLOVE) IMPLANT
GLOVE BIOGEL PI IND STRL 7.0 (GLOVE) IMPLANT
GLOVE BIOGEL PI IND STRL 7.5 (GLOVE) IMPLANT
GLOVE BIOGEL PI INDICATOR 6.5 (GLOVE) ×1
GLOVE BIOGEL PI INDICATOR 7.0 (GLOVE) ×1
GLOVE BIOGEL PI INDICATOR 7.5 (GLOVE)
GLOVE ECLIPSE 6.5 STRL STRAW (GLOVE) IMPLANT
GLOVE ECLIPSE 7.0 STRL STRAW (GLOVE) IMPLANT
GLOVE ECLIPSE 7.5 STRL STRAW (GLOVE) IMPLANT
GLOVE EXAM NITRILE LRG STRL (GLOVE) IMPLANT
GLOVE EXAM NITRILE MD LF STRL (GLOVE) IMPLANT
GLOVE SKINSENSE NS SZ6.5 (GLOVE)
GLOVE SKINSENSE NS SZ7.0 (GLOVE)
GLOVE SKINSENSE STRL SZ6.5 (GLOVE) IMPLANT
GLOVE SKINSENSE STRL SZ7.0 (GLOVE) IMPLANT
KIT VITRECTOMY (OPHTHALMIC RELATED) IMPLANT
PAD ARMBOARD 7.5X6 YLW CONV (MISCELLANEOUS) ×1 IMPLANT
PROC W NO LENS (INTRAOCULAR LENS)
PROC W SPEC LENS (INTRAOCULAR LENS)
PROCESS W NO LENS (INTRAOCULAR LENS) IMPLANT
PROCESS W SPEC LENS (INTRAOCULAR LENS) IMPLANT
RETRACTOR IRIS SIGHTPATH (OPHTHALMIC RELATED) IMPLANT
RING MALYGIN (MISCELLANEOUS) IMPLANT
SIGHTPATH CAT PROC W REG LENS (Ophthalmic Related) ×2 IMPLANT
SYRINGE LUER LOK 1CC (MISCELLANEOUS) ×1 IMPLANT
TAPE SURG TRANSPORE 1 IN (GAUZE/BANDAGES/DRESSINGS) IMPLANT
TAPE SURGICAL TRANSPORE 1 IN (GAUZE/BANDAGES/DRESSINGS) ×1
VISCOELASTIC ADDITIONAL (OPHTHALMIC RELATED) IMPLANT
WATER STERILE IRR 250ML POUR (IV SOLUTION) ×1 IMPLANT

## 2014-08-23 NOTE — H&P (Signed)
I have reviewed the H&P, the patient was re-examined, and I have identified no interval changes in medical condition and plan of care since the history and physical of record  

## 2014-08-23 NOTE — Anesthesia Postprocedure Evaluation (Signed)
  Anesthesia Post-op Note  Patient: Alexis Davis  Procedure(s) Performed: Procedure(s) (LRB): CATARACT EXTRACTION PHACO AND INTRAOCULAR LENS PLACEMENT RIGHT EYE; CDE:  10.80 (Right)  Patient Location:  Short Stay  Anesthesia Type: MAC  Level of Consciousness: awake  Airway and Oxygen Therapy: Patient Spontanous Breathing  Post-op Pain: none  Post-op Assessment: Post-op Vital signs reviewed, Patient's Cardiovascular Status Stable, Respiratory Function Stable, Patent Airway, No signs of Nausea or vomiting and Pain level controlled  Post-op Vital Signs: Reviewed and stable  Complications: No apparent anesthesia complications

## 2014-08-23 NOTE — Anesthesia Preprocedure Evaluation (Signed)
Anesthesia Evaluation  Patient identified by MRN, date of birth, ID band Patient awake    Reviewed: Allergy & Precautions, NPO status , Patient's Chart, lab work & pertinent test results  Airway Mallampati: I  TM Distance: >3 FB     Dental  (+) Missing, Teeth Intact   Pulmonary COPDformer smoker,  breath sounds clear to auscultation        Cardiovascular hypertension, Pt. on medications Rhythm:Regular Rate:Normal     Neuro/Psych  Neuromuscular disease    GI/Hepatic negative GI ROS,   Endo/Other    Renal/GU      Musculoskeletal   Abdominal   Peds  Hematology   Anesthesia Other Findings   Reproductive/Obstetrics                             Anesthesia Physical Anesthesia Plan  ASA: III  Anesthesia Plan: MAC   Post-op Pain Management:    Induction: Intravenous  Airway Management Planned: Nasal Cannula  Additional Equipment:   Intra-op Plan:   Post-operative Plan:   Informed Consent: I have reviewed the patients History and Physical, chart, labs and discussed the procedure including the risks, benefits and alternatives for the proposed anesthesia with the patient or authorized representative who has indicated his/her understanding and acceptance.     Plan Discussed with:   Anesthesia Plan Comments:         Anesthesia Quick Evaluation

## 2014-08-23 NOTE — Discharge Instructions (Signed)

## 2014-08-23 NOTE — Transfer of Care (Signed)
Immediate Anesthesia Transfer of Care Note  Patient: Alexis Davis  Procedure(s) Performed: Procedure(s) (LRB): CATARACT EXTRACTION PHACO AND INTRAOCULAR LENS PLACEMENT RIGHT EYE; CDE:  10.80 (Right)  Patient Location: Shortstay  Anesthesia Type: MAC  Level of Consciousness: awake  Airway & Oxygen Therapy: Patient Spontanous Breathing   Post-op Assessment: Report given to PACU RN, Post -op Vital signs reviewed and stable and Patient moving all extremities  Post vital signs: Reviewed and stable  Complications: No apparent anesthesia complications

## 2014-08-23 NOTE — Op Note (Signed)
Date of Admission: 08/23/2014  Date of Surgery: 08/23/2014   Pre-Op Dx: Cataract Right Eye  Post-Op Dx: Senile Nuclear Cataract Right  Eye,  Dx Code H25.11  Surgeon: Tonny Branch, M.D.  Assistants: None  Anesthesia: Topical with MAC  Indications: Painless, progressive loss of vision with compromise of daily activities.  Surgery: Cataract Extraction with Intraocular lens Implant Right Eye  Discription: The patient had dilating drops and viscous lidocaine placed into the Right eye in the pre-op holding area. After transfer to the operating room, a time out was performed. The patient was then prepped and draped. Beginning with a 3 degree blade a paracentesis port was made at the surgeon's 2 o'clock position. The anterior chamber was then filled with 1% non-preserved lidocaine. This was followed by filling the anterior chamber with Provisc.  A 2.6mm keratome blade was used to make a clear corneal incision at the temporal limbus.  A bent cystatome needle was used to create a continuous tear capsulotomy. Hydrodissection was performed with balanced salt solution on a Fine canula. The lens nucleus was then removed using the phacoemulsification handpiece. Residual cortex was removed with the I&A handpiece. The anterior chamber and capsular bag were refilled with Provisc. A posterior chamber intraocular lens was placed into the capsular bag with it's injector. The implant was positioned with the Kuglan hook. The Provisc was then removed from the anterior chamber and capsular bag with the I&A handpiece. Stromal hydration of the main incision and paracentesis port was performed with BSS on a Fine canula. The wounds were tested for leak which was negative. The patient tolerated the procedure well. There were no operative complications. The patient was then transferred to the recovery room in stable condition.  Complications: None  Specimen: None  EBL: None  Prosthetic device: Hoya iSert 250, power 19.5 D,  SN [id number].

## 2014-08-23 NOTE — Anesthesia Procedure Notes (Signed)
Procedure Name: MAC Date/Time: 08/23/2014 7:58 AM Performed by: Vista Deck Pre-anesthesia Checklist: Patient identified, Emergency Drugs available, Suction available, Timeout performed and Patient being monitored Patient Re-evaluated:Patient Re-evaluated prior to inductionOxygen Delivery Method: Nasal Cannula

## 2014-08-27 ENCOUNTER — Encounter (HOSPITAL_COMMUNITY): Payer: Self-pay | Admitting: Ophthalmology

## 2014-08-30 DIAGNOSIS — H2512 Age-related nuclear cataract, left eye: Secondary | ICD-10-CM | POA: Diagnosis not present

## 2014-09-28 ENCOUNTER — Encounter (HOSPITAL_COMMUNITY): Payer: Self-pay

## 2014-10-01 ENCOUNTER — Encounter (HOSPITAL_COMMUNITY)
Admission: RE | Admit: 2014-10-01 | Discharge: 2014-10-01 | Disposition: A | Discharge status: Home / Self Care | Payer: Medicare Other | Source: Ambulatory Visit | Attending: Ophthalmology | Admitting: Ophthalmology

## 2014-10-05 MED ORDER — CYCLOPENTOLATE-PHENYLEPHRINE OP SOLN OPTIME - NO CHARGE
OPHTHALMIC | Status: AC
  Filled 2014-10-05: qty 2

## 2014-10-05 MED ORDER — TETRACAINE HCL 0.5 % OP SOLN
OPHTHALMIC | Status: AC
  Filled 2014-10-05: qty 2

## 2014-10-05 MED ORDER — LIDOCAINE HCL (PF) 1 % IJ SOLN
INTRAMUSCULAR | Status: AC
  Filled 2014-10-05: qty 2

## 2014-10-05 MED ORDER — LIDOCAINE HCL 3.5 % OP GEL
OPHTHALMIC | Status: AC
  Filled 2014-10-05: qty 1

## 2014-10-05 MED ORDER — NEOMYCIN-POLYMYXIN-DEXAMETH 3.5-10000-0.1 OP SUSP
OPHTHALMIC | Status: AC
  Filled 2014-10-05: qty 5

## 2014-10-05 MED ORDER — PHENYLEPHRINE HCL 2.5 % OP SOLN
OPHTHALMIC | Status: AC
  Filled 2014-10-05: qty 15

## 2014-10-08 ENCOUNTER — Ambulatory Visit (HOSPITAL_COMMUNITY): Payer: Medicare Other | Admitting: Anesthesiology

## 2014-10-08 ENCOUNTER — Encounter (HOSPITAL_COMMUNITY)
Admission: RE | Disposition: A | Discharge status: Home / Self Care | Payer: Self-pay | Source: Ambulatory Visit | Attending: Ophthalmology

## 2014-10-08 ENCOUNTER — Encounter (HOSPITAL_COMMUNITY): Payer: Self-pay | Admitting: *Deleted

## 2014-10-08 ENCOUNTER — Ambulatory Visit (HOSPITAL_COMMUNITY)
Admission: RE | Admit: 2014-10-08 | Discharge: 2014-10-08 | Disposition: A | Discharge status: Home / Self Care | Payer: Medicare Other | Source: Ambulatory Visit | Attending: Ophthalmology | Admitting: Ophthalmology

## 2014-10-08 DIAGNOSIS — Z79899 Other long term (current) drug therapy: Secondary | ICD-10-CM | POA: Diagnosis not present

## 2014-10-08 DIAGNOSIS — I10 Essential (primary) hypertension: Secondary | ICD-10-CM | POA: Insufficient documentation

## 2014-10-08 DIAGNOSIS — J449 Chronic obstructive pulmonary disease, unspecified: Secondary | ICD-10-CM | POA: Insufficient documentation

## 2014-10-08 DIAGNOSIS — H269 Unspecified cataract: Secondary | ICD-10-CM | POA: Diagnosis not present

## 2014-10-08 DIAGNOSIS — Z87891 Personal history of nicotine dependence: Secondary | ICD-10-CM | POA: Insufficient documentation

## 2014-10-08 DIAGNOSIS — H2512 Age-related nuclear cataract, left eye: Secondary | ICD-10-CM | POA: Diagnosis not present

## 2014-10-08 HISTORY — PX: CATARACT EXTRACTION W/PHACO: SHX586

## 2014-10-08 SURGERY — PHACOEMULSIFICATION, CATARACT, WITH IOL INSERTION
Anesthesia: Monitor Anesthesia Care | Site: Eye | Laterality: Left

## 2014-10-08 MED ORDER — EPINEPHRINE HCL 1 MG/ML IJ SOLN
INTRAMUSCULAR | Status: AC
  Filled 2014-10-08: qty 1

## 2014-10-08 MED ORDER — MIDAZOLAM HCL 2 MG/2ML IJ SOLN
INTRAMUSCULAR | Status: AC
  Filled 2014-10-08: qty 2

## 2014-10-08 MED ORDER — LACTATED RINGERS IV SOLN
INTRAVENOUS | Status: DC
  Administered 2014-10-08: 09:00:00 via INTRAVENOUS

## 2014-10-08 MED ORDER — EPINEPHRINE HCL 1 MG/ML IJ SOLN
INTRAMUSCULAR | Status: DC | PRN
  Administered 2014-10-08: 500 mL

## 2014-10-08 MED ORDER — MIDAZOLAM HCL 2 MG/2ML IJ SOLN
1.0000 mg | INTRAMUSCULAR | Status: DC | PRN
  Administered 2014-10-08: 2 mg via INTRAVENOUS

## 2014-10-08 MED ORDER — TETRACAINE HCL 0.5 % OP SOLN
1.0000 [drp] | OPHTHALMIC | Status: AC
  Administered 2014-10-08 (×3): 1 [drp] via OPHTHALMIC

## 2014-10-08 MED ORDER — FENTANYL CITRATE (PF) 100 MCG/2ML IJ SOLN
INTRAMUSCULAR | Status: AC
  Filled 2014-10-08: qty 2

## 2014-10-08 MED ORDER — CYCLOPENTOLATE-PHENYLEPHRINE 0.2-1 % OP SOLN
1.0000 [drp] | OPHTHALMIC | Status: AC
  Administered 2014-10-08 (×3): 1 [drp] via OPHTHALMIC

## 2014-10-08 MED ORDER — LIDOCAINE HCL (PF) 1 % IJ SOLN
INTRAMUSCULAR | Status: DC | PRN
  Administered 2014-10-08: .6 mL

## 2014-10-08 MED ORDER — POVIDONE-IODINE 5 % OP SOLN
OPHTHALMIC | Status: DC | PRN
  Administered 2014-10-08: 1 via OPHTHALMIC

## 2014-10-08 MED ORDER — LIDOCAINE HCL 3.5 % OP GEL
1.0000 "application " | Freq: Once | OPHTHALMIC | Status: AC
  Administered 2014-10-08: 1 via OPHTHALMIC

## 2014-10-08 MED ORDER — PROVISC 10 MG/ML IO SOLN
INTRAOCULAR | Status: DC | PRN
  Administered 2014-10-08: 0.85 mL via INTRAOCULAR

## 2014-10-08 MED ORDER — FENTANYL CITRATE (PF) 100 MCG/2ML IJ SOLN
25.0000 ug | INTRAMUSCULAR | Status: AC
  Administered 2014-10-08: 25 ug via INTRAVENOUS

## 2014-10-08 MED ORDER — BSS IO SOLN
INTRAOCULAR | Status: DC | PRN
  Administered 2014-10-08: 15 mL

## 2014-10-08 MED ORDER — NEOMYCIN-POLYMYXIN-DEXAMETH 3.5-10000-0.1 OP SUSP
OPHTHALMIC | Status: DC | PRN
  Administered 2014-10-08: 2 [drp] via OPHTHALMIC

## 2014-10-08 MED ORDER — PHENYLEPHRINE HCL 2.5 % OP SOLN
1.0000 [drp] | OPHTHALMIC | Status: AC
  Administered 2014-10-08 (×3): 1 [drp] via OPHTHALMIC

## 2014-10-08 SURGICAL SUPPLY — 33 items
CAPSULAR TENSION RING-AMO (OPHTHALMIC RELATED) IMPLANT
CLOTH BEACON ORANGE TIMEOUT ST (SAFETY) ×1 IMPLANT
EYE SHIELD UNIVERSAL CLEAR (GAUZE/BANDAGES/DRESSINGS) ×1 IMPLANT
GLOVE BIO SURGEON STRL SZ 6.5 (GLOVE) IMPLANT
GLOVE BIOGEL PI IND STRL 6.5 (GLOVE) IMPLANT
GLOVE BIOGEL PI IND STRL 7.0 (GLOVE) IMPLANT
GLOVE BIOGEL PI IND STRL 7.5 (GLOVE) IMPLANT
GLOVE BIOGEL PI INDICATOR 6.5 (GLOVE)
GLOVE BIOGEL PI INDICATOR 7.0 (GLOVE) ×1
GLOVE BIOGEL PI INDICATOR 7.5 (GLOVE)
GLOVE ECLIPSE 6.5 STRL STRAW (GLOVE) IMPLANT
GLOVE ECLIPSE 7.0 STRL STRAW (GLOVE) IMPLANT
GLOVE ECLIPSE 7.5 STRL STRAW (GLOVE) IMPLANT
GLOVE EXAM NITRILE LRG STRL (GLOVE) IMPLANT
GLOVE EXAM NITRILE MD LF STRL (GLOVE) ×1 IMPLANT
GLOVE SKINSENSE NS SZ6.5 (GLOVE)
GLOVE SKINSENSE NS SZ7.0 (GLOVE)
GLOVE SKINSENSE STRL SZ6.5 (GLOVE) IMPLANT
GLOVE SKINSENSE STRL SZ7.0 (GLOVE) IMPLANT
KIT VITRECTOMY (OPHTHALMIC RELATED) IMPLANT
PAD ARMBOARD 7.5X6 YLW CONV (MISCELLANEOUS) ×1 IMPLANT
PROC W NO LENS (INTRAOCULAR LENS)
PROC W SPEC LENS (INTRAOCULAR LENS)
PROCESS W NO LENS (INTRAOCULAR LENS) IMPLANT
PROCESS W SPEC LENS (INTRAOCULAR LENS) IMPLANT
RETRACTOR IRIS SIGHTPATH (OPHTHALMIC RELATED) IMPLANT
RING MALYGIN (MISCELLANEOUS) IMPLANT
SIGHTPATH CAT PROC W REG LENS (Ophthalmic Related) ×2 IMPLANT
SYRINGE LUER LOK 1CC (MISCELLANEOUS) ×1 IMPLANT
TAPE SURG TRANSPORE 1 IN (GAUZE/BANDAGES/DRESSINGS) IMPLANT
TAPE SURGICAL TRANSPORE 1 IN (GAUZE/BANDAGES/DRESSINGS) ×1
VISCOELASTIC ADDITIONAL (OPHTHALMIC RELATED) IMPLANT
WATER STERILE IRR 250ML POUR (IV SOLUTION) ×1 IMPLANT

## 2014-10-08 NOTE — Op Note (Signed)
Date of Admission: 10/08/2014  Date of Surgery: 10/08/2014   Pre-Op Dx: Cataract Left Eye  Post-Op Dx: Senile Nuclear Cataract Left  Eye,  Dx Code H25.12  Surgeon: Tonny Branch, M.D.  Assistants: None  Anesthesia: Topical with MAC  Indications: Painless, progressive loss of vision with compromise of daily activities.  Surgery: Cataract Extraction with Intraocular lens Implant Left Eye  Discription: The patient had dilating drops and viscous lidocaine placed into the Left eye in the pre-op holding area. After transfer to the operating room, a time out was performed. The patient was then prepped and draped. Beginning with a 58 degree blade a paracentesis port was made at the surgeon's 2 o'clock position. The anterior chamber was then filled with 1% non-preserved lidocaine. This was followed by filling the anterior chamber with Provisc.  A 2.18mm keratome blade was used to make a clear corneal incision at the temporal limbus.  A bent cystatome needle was used to create a continuous tear capsulotomy. Hydrodissection was performed with balanced salt solution on a Fine canula. The lens nucleus was then removed using the phacoemulsification handpiece. Residual cortex was removed with the I&A handpiece. The anterior chamber and capsular bag were refilled with Provisc. A posterior chamber intraocular lens was placed into the capsular bag with it's injector. The implant was positioned with the Kuglan hook. The Provisc was then removed from the anterior chamber and capsular bag with the I&A handpiece. Stromal hydration of the main incision and paracentesis port was performed with BSS on a Fine canula. The wounds were tested for leak which was negative. The patient tolerated the procedure well. There were no operative complications. The patient was then transferred to the recovery room in stable condition.  Complications: None  Specimen: None  EBL: None  Prosthetic device: Hoya iSert 250, power 19.5 D, SN  [id number].

## 2014-10-08 NOTE — Transfer of Care (Signed)
Immediate Anesthesia Transfer of Care Note  Patient: Alexis Davis  Procedure(s) Performed: Procedure(s) (LRB): CATARACT EXTRACTION PHACO AND INTRAOCULAR LENS PLACEMENT (IOC) (Left)  Patient Location: Shortstay  Anesthesia Type: MAC  Level of Consciousness: awake  Airway & Oxygen Therapy: Patient Spontanous Breathing   Post-op Assessment: Report given to PACU RN, Post -op Vital signs reviewed and stable and Patient moving all extremities  Post vital signs: Reviewed and stable  Complications: No apparent anesthesia complications

## 2014-10-08 NOTE — Anesthesia Preprocedure Evaluation (Signed)
Anesthesia Evaluation  Patient identified by MRN, date of birth, ID band Patient awake    Reviewed: Allergy & Precautions, NPO status , Patient's Chart, lab work & pertinent test results  Airway Mallampati: I  TM Distance: >3 FB     Dental  (+) Missing, Teeth Intact   Pulmonary COPDformer smoker,  breath sounds clear to auscultation        Cardiovascular hypertension, Pt. on medications Rhythm:Regular Rate:Normal     Neuro/Psych  Neuromuscular disease    GI/Hepatic negative GI ROS,   Endo/Other    Renal/GU      Musculoskeletal   Abdominal   Peds  Hematology   Anesthesia Other Findings   Reproductive/Obstetrics                             Anesthesia Physical Anesthesia Plan  ASA: III  Anesthesia Plan: MAC   Post-op Pain Management:    Induction: Intravenous  Airway Management Planned: Nasal Cannula  Additional Equipment:   Intra-op Plan:   Post-operative Plan:   Informed Consent: I have reviewed the patients History and Physical, chart, labs and discussed the procedure including the risks, benefits and alternatives for the proposed anesthesia with the patient or authorized representative who has indicated his/her understanding and acceptance.     Plan Discussed with:   Anesthesia Plan Comments:         Anesthesia Quick Evaluation

## 2014-10-08 NOTE — Discharge Instructions (Signed)
Cataract Surgery Care After Refer to this sheet in the next few weeks. These instructions provide you with information on caring for yourself after your procedure. Your caregiver may also give you more specific instructions. Your treatment has been planned according to current medical practices, but problems sometimes occur. Call your caregiver if you have any problems or questions after your procedure.  HOME CARE INSTRUCTIONS   Avoid strenuous activities as directed by your caregiver.  Ask your caregiver when you can resume driving.  Use eyedrops or other medicines to help healing and control pressure inside your eye as directed by your caregiver.  Only take over-the-counter or prescription medicines for pain, discomfort, or fever as directed by your caregiver.  Do not to touch or rub your eyes.  You may be instructed to use a protective shield during the first few days and nights after surgery. If not, wear sunglasses to protect your eyes. This is to protect the eye from pressure or from being accidentally bumped.  Keep the area around your eye clean and dry. Avoid swimming or allowing water to hit you directly in the face while showering. Keep soap and shampoo out of your eyes.  Do not bend or lift heavy objects. Bending increases pressure in the eye. You can walk, climb stairs, and do light household chores.  Do not put a contact lens into the eye that had surgery until your caregiver says it is okay to do so.  Ask your doctor when you can return to work. This will depend on the kind of work that you do. If you work in a dusty environment, you may be advised to wear protective eyewear for a period of time.  Ask your caregiver when it will be safe to engage in sexual activity.  Continue with your regular eye exams as directed by your caregiver. What to expect:  It is normal to feel itching and mild discomfort for a few days after cataract surgery. Some fluid discharge is also common,  and your eye may be sensitive to light and touch.  After 1 to 2 days, even moderate discomfort should disappear. In most cases, healing will take about 6 weeks.  If you received an intraocular lens (IOL), you may notice that colors are very bright or have a blue tinge. Also, if you have been in bright sunlight, everything may appear reddish for a few hours. If you see these color tinges, it is because your lens is clear and no longer cloudy. Within a few months after receiving an IOL, these extra colors should go away. When you have healed, you will probably need new glasses. SEEK MEDICAL CARE IF:   You have increased bruising around your eye.  You have discomfort not helped by medicine. SEEK IMMEDIATE MEDICAL CARE IF:   You have a fever.  You have a worsening or sudden vision loss.  You have redness, swelling, or increasing pain in the eye.  You have a thick discharge from the eye that had surgery. MAKE SURE YOU:  Understand these instructions.  Will watch your condition.  Will get help right away if you are not doing well or get worse. Document Released: 09/05/2004 Document Revised: 05/11/2011 Document Reviewed: 10/10/2010 [facility] Patient Information 2015 [place], [place]. This information is not intended to replace advice given to you by your health care provider. Make sure you discuss any questions you have with your health care provider. Monitored Anesthesia Care Monitored anesthesia care is an anesthesia service for a medical  procedure. Anesthesia is the loss of the ability to feel pain. It is produced by medicines called anesthetics. It may affect a small area of your body (local anesthesia), a large area of your body (regional anesthesia), or your entire body (general anesthesia). The need for monitored anesthesia care depends your procedure, your condition, and the potential need for regional or general anesthesia. It is often provided during procedures where:   General  anesthesia may be needed if there are complications. This is because you need special care when you are under general anesthesia.   You will be under local or regional anesthesia. This is so that you are able to have higher levels of anesthesia if needed.   You will receive calming medicines (sedatives). This is especially the case if sedatives are given to put you in a semi-conscious state of relaxation (deep sedation). This is because the amount of sedative needed to produce this state can be hard to predict. Too much of a sedative can produce general anesthesia. Monitored anesthesia care is performed by one or more health care providers who have special training in all types of anesthesia. You will need to meet with these health care providers before your procedure. During this meeting, they will ask you about your medical history. They will also give you instructions to follow. (For example, you will need to stop eating and drinking before your procedure. You may also need to stop or change medicines you are taking.) During your procedure, your health care providers will stay with you. They will:   Watch your condition. This includes watching your blood pressure, breathing, and level of pain.   Diagnose and treat problems that occur.   Give medicines if they are needed. These may include calming medicines (sedatives) and anesthetics.   Make sure you are comfortable.  Having monitored anesthesia care does not necessarily mean that you will be under anesthesia. It does mean that your health care providers will be able to manage anesthesia if you need it or if it occurs. It also means that you will be able to have a different type of anesthesia than you are having if you need it. When your procedure is complete, your health care providers will continue to watch your condition. They will make sure any medicines wear off before you are allowed to go home.  Document Released: 11/12/2004 Document  Revised: 07/03/2013 Document Reviewed: 03/30/2012 [facility] Patient Information 2015 [place], [place]. This information is not intended to replace advice given to you by your health care provider. Make sure you discuss any questions you have with your health care provider.

## 2014-10-08 NOTE — Anesthesia Postprocedure Evaluation (Signed)
  Anesthesia Post-op Note  Patient: Alexis Davis  Procedure(s) Performed: Procedure(s) (LRB): CATARACT EXTRACTION PHACO AND INTRAOCULAR LENS PLACEMENT (IOC) (Left)  Patient Location:  Short Stay  Anesthesia Type: MAC  Level of Consciousness: awake  Airway and Oxygen Therapy: Patient Spontanous Breathing  Post-op Pain: none  Post-op Assessment: Post-op Vital signs reviewed, Patient's Cardiovascular Status Stable, Respiratory Function Stable, Patent Airway, No signs of Nausea or vomiting and Pain level controlled  Post-op Vital Signs: Reviewed and stable  Complications: No apparent anesthesia complications

## 2014-10-08 NOTE — H&P (Signed)
I have reviewed the H&P, the patient was re-examined, and I have identified no interval changes in medical condition and plan of care since the history and physical of record  

## 2014-10-08 NOTE — Anesthesia Procedure Notes (Signed)
Procedure Name: MAC Date/Time: 10/08/2014 9:44 AM Performed by: Vista Deck Pre-anesthesia Checklist: Patient identified, Emergency Drugs available, Suction available, Timeout performed and Patient being monitored Patient Re-evaluated:Patient Re-evaluated prior to inductionOxygen Delivery Method: Nasal Cannula

## 2014-10-09 ENCOUNTER — Encounter: Payer: Self-pay | Admitting: Family Medicine

## 2014-10-09 ENCOUNTER — Ambulatory Visit (INDEPENDENT_AMBULATORY_CARE_PROVIDER_SITE_OTHER): Payer: Medicare Other | Admitting: Family Medicine

## 2014-10-09 VITALS — BP 138/74 | HR 78 | Temp 97.7°F | Resp 16 | Ht 64.0 in | Wt 174.0 lb

## 2014-10-09 DIAGNOSIS — E538 Deficiency of other specified B group vitamins: Secondary | ICD-10-CM | POA: Diagnosis not present

## 2014-10-09 DIAGNOSIS — I8390 Asymptomatic varicose veins of unspecified lower extremity: Secondary | ICD-10-CM

## 2014-10-09 DIAGNOSIS — I868 Varicose veins of other specified sites: Secondary | ICD-10-CM

## 2014-10-09 DIAGNOSIS — M25569 Pain in unspecified knee: Secondary | ICD-10-CM | POA: Diagnosis not present

## 2014-10-09 DIAGNOSIS — G629 Polyneuropathy, unspecified: Secondary | ICD-10-CM | POA: Diagnosis not present

## 2014-10-09 DIAGNOSIS — I10 Essential (primary) hypertension: Secondary | ICD-10-CM | POA: Diagnosis not present

## 2014-10-09 LAB — COMPREHENSIVE METABOLIC PANEL
ALT: 9 U/L (ref 6–29)
AST: 14 U/L (ref 10–35)
Albumin: 3.6 g/dL (ref 3.6–5.1)
Alkaline Phosphatase: 74 U/L (ref 33–130)
BUN: 9 mg/dL (ref 7–25)
CHLORIDE: 108 mmol/L (ref 98–110)
CO2: 26 mmol/L (ref 20–31)
CREATININE: 0.58 mg/dL — AB (ref 0.60–0.93)
Calcium: 9.3 mg/dL (ref 8.6–10.4)
GLUCOSE: 81 mg/dL (ref 70–99)
Potassium: 4.5 mmol/L (ref 3.5–5.3)
SODIUM: 143 mmol/L (ref 135–146)
TOTAL PROTEIN: 5.9 g/dL — AB (ref 6.1–8.1)
Total Bilirubin: 0.4 mg/dL (ref 0.2–1.2)

## 2014-10-09 LAB — CBC
HEMATOCRIT: 43.4 % (ref 36.0–46.0)
Hemoglobin: 14.2 g/dL (ref 12.0–15.0)
MCH: 30 pg (ref 26.0–34.0)
MCHC: 32.7 g/dL (ref 30.0–36.0)
MCV: 91.6 fL (ref 78.0–100.0)
MPV: 12.1 fL (ref 8.6–12.4)
PLATELETS: 203 10*3/uL (ref 150–400)
RBC: 4.74 MIL/uL (ref 3.87–5.11)
RDW: 13.7 % (ref 11.5–15.5)
WBC: 8.5 10*3/uL (ref 4.0–10.5)

## 2014-10-09 LAB — VITAMIN B12: Vitamin B-12: 323 pg/mL (ref 211–911)

## 2014-10-09 MED ORDER — GABAPENTIN 100 MG PO CAPS: ORAL_CAPSULE | ORAL | Status: DC

## 2014-10-09 NOTE — Progress Notes (Signed)
Patient ID: Alexis Davis, female   DOB: Feb 22, 1941, 74 y.o.   MRN: [id number]   Subjective:    Patient ID: Alexis Davis, female    DOB: 1940/10/20, 74 y.o.   MRN: [id number]  Patient presents for Neuropathy  patient here with severe burning in  bilateral legs and feet. This is been persistent for the past 3 months, she thought it was due to the anesthesia she had for her cataract surgery. +numbness ib bilat great toes, pain is worst after she stops walking, her legs feel heavy and burn, also has trouble sleeping at night. Denies back pain, no falls, no injury, no change in bowel or bladder. No new medications.    Review Of Systems:  GEN- denies fatigue, fever, weight loss,weakness, recent illness HEENT- denies eye drainage, change in vision, nasal discharge, CVS- denies chest pain, palpitations RESP- denies SOB, cough, wheeze ABD- denies N/V, change in stools, abd pain GU- denies dysuria, hematuria, dribbling, incontinence MSK- denies joint pain,+ muscle aches, injury Neuro- denies headache, dizziness, syncope, seizure activity       Objective:    BP 138/74 mmHg  Pulse 78  Temp(Src) 97.7 F (36.5 C) (Oral)  Resp 16  Ht 5\' 4"  (1.626 m)  Wt 174 lb (78.926 kg)  BMI 29.85 kg/m2 GEN- NAD, alert and oriented x3 HEENT- PERRL, EOMI, non injected sclera, pink conjunctiva, MMM, oropharynx clear Neck- Supple,  CVS- RRR, no murmur RESP-CTAB MSK- Spine NT, fair ROM,neg SLR, TTP bilat legs and feet  Neuro- decrease monofilament great toes bilat, other areas normal sensation, sensation grossly intact, normal tone Skin-varicose veins,skin intact, no rash  EXT- No edema Pulses- Radial 2+, DP- 1+        Assessment & Plan:      Problem List Items Addressed This Visit    ESSENTIAL HYPERTENSION, BENIGN - Primary    BP optimal for her age and comorbidites, no chane to medications      Relevant Orders   Comprehensive metabolic panel   CBC    Other Visit Diagnoses    Peripheral neuropathy        symptoms more consistent with nerve damage, pt not diabetic, no back pain to suggest spinal stenosis, ? idopathic neuropathy, B12 def, or due to PVD, check arterial studies Will also start low dose gabapentin and titrate up    Relevant Medications    gabapentin (NEURONTIN) 100 MG capsule    Other Relevant Orders    Vitamin B12    Comprehensive metabolic panel    CBC       Note: This dictation was prepared with Dragon dictation along with smaller phrase technology. Any transcriptional errors that result from this process are unintentional.

## 2014-10-09 NOTE — Assessment & Plan Note (Signed)
BP optimal for her age and comorbidites, no chane to medications

## 2014-10-09 NOTE — Patient Instructions (Signed)
Blood flow studies to be done Take the nerve capsule- start with capsule at bedtime, after 1 week, increase to 2 capsules We will call with lab results F/U 3 months

## 2014-10-10 ENCOUNTER — Telehealth: Payer: Self-pay | Admitting: *Deleted

## 2014-10-10 NOTE — Telephone Encounter (Signed)
Pt has appt scheduled for Monday Aug 15th at 10:15am arrival for 10:30am appt, there is no prep for this at [facility], left message on pt vm to return my call for appt

## 2014-10-10 NOTE — Telephone Encounter (Signed)
Pt called back and aware of appt 

## 2014-10-15 ENCOUNTER — Ambulatory Visit (HOSPITAL_COMMUNITY)
Admission: RE | Admit: 2014-10-15 | Discharge: 2014-10-15 | Disposition: A | Discharge status: Home / Self Care | Payer: Medicare Other | Source: Ambulatory Visit | Attending: Family Medicine | Admitting: Family Medicine

## 2014-10-15 DIAGNOSIS — M25569 Pain in unspecified knee: Secondary | ICD-10-CM

## 2014-10-15 DIAGNOSIS — Z87891 Personal history of nicotine dependence: Secondary | ICD-10-CM | POA: Diagnosis not present

## 2014-10-15 DIAGNOSIS — M79672 Pain in left foot: Secondary | ICD-10-CM | POA: Insufficient documentation

## 2014-10-15 DIAGNOSIS — I8390 Asymptomatic varicose veins of unspecified lower extremity: Secondary | ICD-10-CM

## 2014-10-15 DIAGNOSIS — M79673 Pain in unspecified foot: Secondary | ICD-10-CM | POA: Diagnosis present

## 2014-10-15 DIAGNOSIS — G629 Polyneuropathy, unspecified: Secondary | ICD-10-CM

## 2014-10-15 DIAGNOSIS — M79662 Pain in left lower leg: Secondary | ICD-10-CM | POA: Diagnosis not present

## 2014-10-15 DIAGNOSIS — R208 Other disturbances of skin sensation: Secondary | ICD-10-CM | POA: Diagnosis not present

## 2014-10-15 DIAGNOSIS — M79661 Pain in right lower leg: Secondary | ICD-10-CM | POA: Diagnosis not present

## 2014-10-15 DIAGNOSIS — M79671 Pain in right foot: Secondary | ICD-10-CM | POA: Diagnosis not present

## 2014-10-17 ENCOUNTER — Ambulatory Visit (HOSPITAL_COMMUNITY): Admission: RE | Admit: 2014-10-17 | Payer: Medicare Other | Source: Ambulatory Visit

## 2014-10-17 ENCOUNTER — Other Ambulatory Visit (HOSPITAL_COMMUNITY): Payer: Medicare Other

## 2014-10-17 ENCOUNTER — Encounter: Payer: Self-pay | Admitting: *Deleted

## 2014-11-07 DIAGNOSIS — Z961 Presence of intraocular lens: Secondary | ICD-10-CM | POA: Diagnosis not present

## 2015-01-11 ENCOUNTER — Ambulatory Visit: Payer: Medicare Other | Admitting: Family Medicine

## 2015-02-11 ENCOUNTER — Ambulatory Visit (INDEPENDENT_AMBULATORY_CARE_PROVIDER_SITE_OTHER): Payer: Medicare Other | Admitting: Family Medicine

## 2015-02-11 ENCOUNTER — Encounter: Payer: Self-pay | Admitting: Family Medicine

## 2015-02-11 VITALS — BP 136/74 | HR 58 | Temp 97.7°F | Resp 14 | Ht 64.0 in | Wt 182.0 lb

## 2015-02-11 DIAGNOSIS — J209 Acute bronchitis, unspecified: Secondary | ICD-10-CM | POA: Diagnosis not present

## 2015-02-11 MED ORDER — GUAIFENESIN-CODEINE 100-10 MG/5ML PO SOLN: 5.0000 mL | Freq: Four times a day (QID) | ORAL | Status: DC | PRN

## 2015-02-11 MED ORDER — AZITHROMYCIN 250 MG PO TABS: ORAL_TABLET | ORAL | Status: DC

## 2015-02-11 MED ORDER — CLOTRIMAZOLE-BETAMETHASONE 1-0.05 % EX CREA: 1.0000 "application " | TOPICAL_CREAM | Freq: Two times a day (BID) | CUTANEOUS | Status: DC

## 2015-02-11 NOTE — Progress Notes (Signed)
Patient ID: Alexis Davis, female   DOB: [birth year], 74 y.o.   MRN: [id number]   Subjective:    Patient ID: Alexis Davis, female    DOB: Aug 08, 1940, 74 y.o.   MRN: [id number]  Patient presents for Illness  Cough with production chest congestion she has history of mild COPD gets bronchitis about once a year. She's not had any fever. She has been using Mucinex DM. She is a previous smoker. Started with nasal drainge, then went to her chest Declines any wheezing or SOB.  Symptoms started after being out in the rain during thanksgiving    Review Of Systems:  GEN- denies fatigue, fever, weight loss,weakness, recent illness HEENT- denies eye drainage, change in vision, nasal discharge, CVS- denies chest pain, palpitations RESP- denies SOB, +cough, wheeze MSK- denies joint pain, muscle aches, injury Neuro- denies headache, dizziness, syncope, seizure activity       Objective:    BP 136/74 mmHg  Pulse 58  Temp(Src) 97.7 F (36.5 C) (Oral)  Resp 14  Ht 5\' 4"  (1.626 m)  Wt 182 lb (82.555 kg)  BMI 31.22 kg/m2  SpO2 98% GEN- NAD, alert and oriented x3 HEENT- PERRL, EOMI, non injected sclera, pink conjunctiva, MMM, oropharynxclear , TM clear bilat no effusion,  No maxillary sinus tenderness, inflammed turbinates,  Nasal drainage  Neck- Supple, no LAD CVS- RRR, no murmur RESP- mild rhonchi, clears with cough,no rales, no wheeze  Pulses- Radial 2+         Assessment & Plan:      Problem List Items Addressed This Visit    Acute bronchitis - Primary    Started as a head cold , now has progressed,has albuterol if needed Nasal saline, zpak,robitussin AC, oxygen sat normal         Note: This dictation was prepared with Dragon dictation along with smaller phrase technology. Any transcriptional errors that result from this process are unintentional.

## 2015-02-11 NOTE — Assessment & Plan Note (Addendum)
Started as a head cold , now has progressed,has albuterol if needed Nasal saline, zpak,robitussin AC, oxygen sat normal

## 2015-02-11 NOTE — Patient Instructions (Signed)
Take antibiotics  Cough medicine Nasal saline rinse  F/U as previous

## 2015-03-11 ENCOUNTER — Ambulatory Visit (INDEPENDENT_AMBULATORY_CARE_PROVIDER_SITE_OTHER): Payer: Medicare Other | Admitting: Family Medicine

## 2015-03-11 ENCOUNTER — Encounter: Payer: Self-pay | Admitting: Family Medicine

## 2015-03-11 VITALS — BP 134/62 | HR 64 | Temp 97.9°F | Resp 16 | Ht 64.0 in | Wt 182.0 lb

## 2015-03-11 DIAGNOSIS — J0100 Acute maxillary sinusitis, unspecified: Secondary | ICD-10-CM

## 2015-03-11 DIAGNOSIS — J209 Acute bronchitis, unspecified: Secondary | ICD-10-CM

## 2015-03-11 MED ORDER — GUAIFENESIN-CODEINE 100-10 MG/5ML PO SOLN: 5.0000 mL | Freq: Four times a day (QID) | ORAL | Status: DC | PRN

## 2015-03-11 MED ORDER — AMOXICILLIN-POT CLAVULANATE 875-125 MG PO TABS: 1.0000 | ORAL_TABLET | Freq: Two times a day (BID) | ORAL | Status: DC

## 2015-03-11 MED ORDER — PREDNISONE 20 MG PO TABS: 40.0000 mg | ORAL_TABLET | Freq: Every day | ORAL | Status: DC

## 2015-03-11 NOTE — Progress Notes (Signed)
Patient ID: Alexis Davis, female   DOB: 1940/06/06, 75 y.o.   MRN: [id number]   Subjective:    Patient ID: Alexis Davis, female    DOB: 1940/05/08, 75 y.o.   MRN: [id number]  Patient presents for Illness  Pt here with second illness, seen about 3 weeks ago for URI, given zpak and robitussin states she improved but about a week ago, started with sinus drainage, and now it is down in her chest, she has been wheezing,she has required her albuterol as well this weekend and had low grade fever. No known sick contacts but she has been sitting  Thick mucous production, cough medicine helped a lot but out of this   Review Of Systems:  GEN- denies fatigue, fever, weight loss,weakness, recent illness HEENT- denies eye drainage, change in vision, +nasal discharge, CVS- denies chest pain, palpitations RESP- +SOB, +cough, +wheeze ABD- denies N/V, change in stools, abd pain Neuro- denies headache, dizziness, syncope, seizure activity       Objective:    BP 134/62 mmHg  Pulse 64  Temp(Src) 97.9 F (36.6 C) (Oral)  Resp 16  Ht 5\' 4"  (1.626 m)  Wt 182 lb (82.555 kg)  BMI 31.22 kg/m2  SpO2 99% GEN- NAD, alert and oriented x3 HEENT- PERRL, EOMI, non injected sclera, pink conjunctiva, MMM, oropharynx mild injection, TM clear bilat no effusion,  + maxillary sinus tenderness, inflammed turbinates,  Nasal drainage  Neck- Supple, no LAD CVS- RRR, no murmur RESP-CTAB EXT- No edema Pulses- Radial 2+          Assessment & Plan:      Problem List Items Addressed This Visit    Acute bronchitis    Other Visit Diagnoses    Acute maxillary sinusitis, recurrence not specified    -  Primary    second illness in past month with sinusitis and now bronchitis, given prednisone, augmentin, nasal saline, refilled robitussin AC, CXR if not improved by end of week    Relevant Medications    guaiFENesin-codeine 100-10 MG/5ML syrup    predniSONE (DELTASONE) 20 MG tablet    amoxicillin-clavulanate (AUGMENTIN) 875-125 MG tablet       Note: This dictation was prepared with Dragon dictation along with smaller phrase technology. Any transcriptional errors that result from this process are unintentional.

## 2015-03-11 NOTE — Patient Instructions (Signed)
Take new antibiotics Prednisone take in the morning  Robitussin codiene cough medicine  F/U as previous

## 2015-04-09 ENCOUNTER — Other Ambulatory Visit: Payer: Self-pay | Admitting: Family Medicine

## 2015-04-09 NOTE — Telephone Encounter (Signed)
Refill appropriate and filled per protocol. 

## 2015-06-11 ENCOUNTER — Ambulatory Visit (INDEPENDENT_AMBULATORY_CARE_PROVIDER_SITE_OTHER): Payer: Medicare Other | Admitting: Family Medicine

## 2015-06-11 ENCOUNTER — Encounter: Payer: Self-pay | Admitting: Family Medicine

## 2015-06-11 VITALS — BP 134/80 | HR 68 | Temp 97.9°F | Resp 14 | Ht 64.0 in | Wt 179.0 lb

## 2015-06-11 DIAGNOSIS — M25512 Pain in left shoulder: Secondary | ICD-10-CM

## 2015-06-11 DIAGNOSIS — M6789 Other specified disorders of synovium and tendon, multiple sites: Secondary | ICD-10-CM

## 2015-06-11 DIAGNOSIS — M76829 Posterior tibial tendinitis, unspecified leg: Secondary | ICD-10-CM

## 2015-06-11 MED ORDER — TRAMADOL HCL 50 MG PO TABS: 50.0000 mg | ORAL_TABLET | Freq: Three times a day (TID) | ORAL | Status: DC | PRN

## 2015-06-11 MED ORDER — DICLOFENAC POTASSIUM 50 MG PO TABS: 50.0000 mg | ORAL_TABLET | Freq: Two times a day (BID) | ORAL | Status: DC

## 2015-06-11 NOTE — Progress Notes (Signed)
Patient ID: Alexis Davis, female   DOB: [birth year], 75 y.o.   MRN: [id number]   Subjective:    Patient ID: Alexis Davis, female    DOB: Mar 10, 1940, 75 y.o.   MRN: [id number]  Patient presents for L Arm Pain Patient here with left shoulder and arm pain. She states developing she can recalls when she was at the dentist 2 weeks ago she was very fearful while he was doing her dental procedure and she was pushing back into her arm afterwards she felt soreness and arm. She never noticed any swelling or bruising. She has pain when she lies on that side. She took an Aleve once in some Tylenol but this did not help. She still able to move her arm around as normal. She denies any chest pain no significant shortness of breath. She has had some mild cough with the pollen.    Review Of Systems:  GEN- denies fatigue, fever, weight loss,weakness, recent illness HEENT- denies eye drainage, change in vision, nasal discharge, CVS- denies chest pain, palpitations RESP- denies SOB, cough, wheeze ABD- denies N/V, change in stools, abd pain GU- denies dysuria, hematuria, dribbling, incontinence MSK-+ joint pain, muscle aches, injury Neuro- denies headache, dizziness, syncope, seizure activity       Objective:    BP 134/80 mmHg  Pulse 68  Temp(Src) 97.9 F (36.6 C) (Oral)  Resp 14  Ht 5\' 4"  (1.626 m)  Wt 179 lb (81.194 kg)  BMI 30.71 kg/m2 GEN- NAD, alert and oriented x3 HEENT- PERRL, EOMI, non injected sclera, pink conjunctiva, MMM, oropharynx clear Neck- Supple,fair ROM CVS- RRR, no murmur RESP-CTAB MSK LUE, normal inspection, no swelling, no erythema, mild TTP biceps/triceps, fair ROM upper ext bilat, neg empty can, neg hawkins, strength equal bilat  Pulses- Radial - 2+        Assessment & Plan:      Problem List Items Addressed This Visit    PTTD (posterior tibial tendon dysfunction) - Primary   Relevant Medications   diclofenac (CATAFLAM) 50 MG tablet    Other Visit  Diagnoses    Left shoulder pain        possible strain of arm/shoulder muscles, though no specific injury, trial of diclofenac BID, ICE and Ultram for severe pain. If no improve in 1 week Xray        Note: This dictation was prepared with Dragon dictation along with smaller phrase technology. Any transcriptional errors that result from this process are unintentional.

## 2015-06-11 NOTE — Patient Instructions (Addendum)
Take the diclofenac twice a day for inflammation  Ultram for pain Call in 1 week if not better, and xrays to be done Try alternate ICE and Heat  F/U pending improvement

## 2015-06-24 ENCOUNTER — Encounter: Payer: Self-pay | Admitting: Family Medicine

## 2015-06-24 ENCOUNTER — Ambulatory Visit (INDEPENDENT_AMBULATORY_CARE_PROVIDER_SITE_OTHER): Payer: Medicare Other | Admitting: Family Medicine

## 2015-06-24 VITALS — BP 132/78 | HR 62 | Temp 98.7°F | Resp 14 | Ht 64.0 in | Wt 180.0 lb

## 2015-06-24 DIAGNOSIS — J029 Acute pharyngitis, unspecified: Secondary | ICD-10-CM

## 2015-06-24 MED ORDER — AMOXICILLIN 500 MG PO CAPS: 500.0000 mg | ORAL_CAPSULE | Freq: Two times a day (BID) | ORAL | Status: DC

## 2015-06-24 MED ORDER — FIRST-DUKES MOUTHWASH MT SUSP: OROMUCOSAL | Status: DC

## 2015-06-24 NOTE — Progress Notes (Signed)
Patient ID: Alexis Davis, female   DOB: 25-Sep-1940, 75 y.o.   MRN: [id number]   Subjective:    Patient ID: Alexis Davis, female    DOB: 13-Feb-1941, 75 y.o.   MRN: [id number]  Patient presents for Illness Is here with sore throat for the past 3 days. She states that she feels feeling well and he came on her suddenly. She has occasional cough. Denies any nasal drainage no production with cough. She has subjective fever and some chills and body aches. She denies any nausea vomiting no GI symptoms. No known sick contacts. She is only taking her regular medications not the else over-the-counter.    Review Of Systems:  GEN- denies fatigue,+ fever, weight loss,weakness, recent illness HEENT- denies eye drainage, change in vision, nasal discharge, CVS- denies chest pain, palpitations RESP- denies SOB,+ cough, wheeze ABD- denies N/V, change in stools, abd pain Neuro- denies headache, dizziness, syncope, seizure activity       Objective:    BP 132/78 mmHg  Pulse 62  Temp(Src) 98.7 F (37.1 C) (Oral)  Resp 14  Ht 5\' 4"  (1.626 m)  Wt 180 lb (81.647 kg)  BMI 30.88 kg/m2  SpO2 98% GEN- NAD, alert and oriented x3 HEENT- PERRL, EOMI, non injected sclera, pink conjunctiva, MMM, post oropharynx injected, no exudates Neck- Supple, + anterior and submandibular LAD  CVS- RRR, no murmur RESP-CTAB Pulses- Radial  2+        Assessment & Plan:      Problem List Items Addressed This Visit    None    Visit Diagnoses    Acute pharyngitis, unspecified etiology    -  Primary    Bacterial pharygitis, pt also smoker and with age, strep in house negative. Start amox, dukes magic mouthwash, culture sent     Relevant Orders    STREP GROUP A AG, W/REFLEX TO CULT       Note: This dictation was prepared with Dragon dictation along with smaller phrase technology. Any transcriptional errors that result from this process are unintentional.

## 2015-06-24 NOTE — Patient Instructions (Signed)
Take antibiotics as prescribed Use special mouthwash  F/U as needed

## 2015-07-01 ENCOUNTER — Encounter: Payer: Self-pay | Admitting: Family Medicine

## 2015-07-01 ENCOUNTER — Ambulatory Visit (INDEPENDENT_AMBULATORY_CARE_PROVIDER_SITE_OTHER): Payer: Medicare Other | Admitting: Family Medicine

## 2015-07-01 VITALS — BP 132/78 | HR 70 | Temp 98.7°F | Resp 18 | Ht 64.0 in | Wt 178.0 lb

## 2015-07-01 DIAGNOSIS — J441 Chronic obstructive pulmonary disease with (acute) exacerbation: Secondary | ICD-10-CM

## 2015-07-01 MED ORDER — METHYLPREDNISOLONE ACETATE 40 MG/ML IJ SUSP
40.0000 mg | Freq: Once | INTRAMUSCULAR | Status: AC
  Administered 2015-07-01: 40 mg via INTRAMUSCULAR

## 2015-07-01 MED ORDER — AZITHROMYCIN 250 MG PO TABS
ORAL_TABLET | ORAL | Status: DC
Start: 2015-07-01 — End: 2015-08-06

## 2015-07-01 MED ORDER — PREDNISONE 20 MG PO TABS: 40.0000 mg | ORAL_TABLET | Freq: Every day | ORAL | Status: DC

## 2015-07-01 MED ORDER — ALBUTEROL SULFATE HFA 108 (90 BASE) MCG/ACT IN AERS: 2.0000 | INHALATION_SPRAY | RESPIRATORY_TRACT | Status: DC | PRN

## 2015-07-01 MED ORDER — ALBUTEROL SULFATE (2.5 MG/3ML) 0.083% IN NEBU
2.5000 mg | INHALATION_SOLUTION | Freq: Once | RESPIRATORY_TRACT | Status: AC
  Administered 2015-07-01: 2.5 mg via RESPIRATORY_TRACT

## 2015-07-01 MED ORDER — BENZONATATE 100 MG PO CAPS: 100.0000 mg | ORAL_CAPSULE | Freq: Two times a day (BID) | ORAL | Status: DC | PRN

## 2015-07-01 NOTE — Progress Notes (Signed)
Patient ID: Alexis Davis, female   DOB: 1940-04-17, 76 y.o.   MRN: [id number]    Subjective:    Patient ID: Alexis Davis, female    DOB: [birth year], 75 y.o.   MRN: [id number]  Patient presents for F/U Illness Patient here with worsening symptoms. She was initially seen with sore throat and low-grade fever I did treat her with amoxicillin her sore throat resolved however now she has chest congestion and wheezing she's had some sweats. She's been taking Robitussin with minimal improvement. She feels like she cannot break it up from her chest. She does not have any more albuterol. She also tried her Robitussin with codeine but there is no improvement with her cough. She's had a lot of runny nose but has not been taking her antihistamine. She states for a few days she was blowing very thick green mucus She does have mild COPD    Review Of Systems:  GEN- denies fatigue, fever, weight loss,weakness, recent illness HEENT- denies eye drainage, change in vision,+ nasal discharge, CVS- denies chest pain, palpitations RESP- denies SOB,+ cough, +wheeze ABD- denies N/V, change in stools, abd painy Neuro- denies headache, dizziness, syncope, seizure activity       Objective:    BP 132/78 mmHg  Pulse 70  Temp(Src) 98.7 F (37.1 C) (Oral)  Resp 18  Ht 5\' 4"  (1.626 m)  Wt 178 lb (80.74 kg)  BMI 30.54 kg/m2  SpO2 97% GEN- NAD, alert and oriented x3 HEENT- PERRL, EOMI, non injected sclera, pink conjunctiva, MMM, oropharynx clear,clear rhinorrhea, no maxillary sinus tenderness   Neck- Supple, no LAD  CVS- RRR, no murmur RESP- bilat wheeze, mild rhonchi, normal WOB, decreased air movement  S/p neb improved air movement and wheeze  EXT- No edema Pulses- Radial  2+        Assessment & Plan:      Problem List Items Addressed This Visit    None    Visit Diagnoses    COPD exacerbation ([place])    -  Primary    Now with COPD exacerbation, pharygmitis, was likley beginning of  illness, also pollen playing a role. Given Depo Medrol, albuterol neb in office, responded well. We'll treat with prednisone 40 mg 5 days azithromycin and Tessalon. She will use antihistamine for her runny nose.  Chest x-ray she does not improve     Relevant Medications    predniSONE (DELTASONE) 20 MG tablet    albuterol (PROVENTIL HFA;VENTOLIN HFA) 108 (90 Base) MCG/ACT inhaler    methylPREDNISolone acetate (DEPO-MEDROL) injection 40 mg (Completed)    albuterol (PROVENTIL) (2.5 MG/3ML) 0.083% nebulizer solution 2.5 mg (Completed)    benzonatate (TESSALON) 100 MG capsule    azithromycin (ZITHROMAX) 250 MG tablet       Note: This dictation was prepared with Dragon dictation along with smaller phrase technology. Any transcriptional errors that result from this process are unintentional.

## 2015-07-01 NOTE — Patient Instructions (Signed)
Bronchitis- Take steroids(prednisone) start tomorrow Take new antibiotic- zpak  Use either the cough pills or robitussin Take clartin for sinus drainage  Use inhaler every 4 hours

## 2015-07-12 LAB — BASIC METABOLIC PANEL: Glucose: 69 mg/dL

## 2015-08-06 ENCOUNTER — Encounter: Payer: Self-pay | Admitting: Family Medicine

## 2015-08-06 ENCOUNTER — Ambulatory Visit (INDEPENDENT_AMBULATORY_CARE_PROVIDER_SITE_OTHER): Payer: Medicare Other | Admitting: Family Medicine

## 2015-08-06 VITALS — BP 138/74 | HR 72 | Temp 98.6°F | Resp 16 | Ht 64.0 in | Wt 176.0 lb

## 2015-08-06 DIAGNOSIS — R52 Pain, unspecified: Secondary | ICD-10-CM

## 2015-08-06 DIAGNOSIS — I10 Essential (primary) hypertension: Secondary | ICD-10-CM | POA: Diagnosis not present

## 2015-08-06 DIAGNOSIS — E538 Deficiency of other specified B group vitamins: Secondary | ICD-10-CM | POA: Diagnosis not present

## 2015-08-06 DIAGNOSIS — R5383 Other fatigue: Secondary | ICD-10-CM | POA: Diagnosis not present

## 2015-08-06 DIAGNOSIS — E559 Vitamin D deficiency, unspecified: Secondary | ICD-10-CM | POA: Diagnosis not present

## 2015-08-06 DIAGNOSIS — R0989 Other specified symptoms and signs involving the circulatory and respiratory systems: Secondary | ICD-10-CM | POA: Diagnosis not present

## 2015-08-06 LAB — COMPREHENSIVE METABOLIC PANEL
ALK PHOS: 84 U/L (ref 33–130)
ALT: 27 U/L (ref 6–29)
AST: 26 U/L (ref 10–35)
Albumin: 3.6 g/dL (ref 3.6–5.1)
BUN: 13 mg/dL (ref 7–25)
CO2: 22 mmol/L (ref 20–31)
Calcium: 9.2 mg/dL (ref 8.6–10.4)
Chloride: 107 mmol/L (ref 98–110)
Creat: 0.56 mg/dL — ABNORMAL LOW (ref 0.60–0.93)
Glucose, Bld: 86 mg/dL (ref 70–99)
POTASSIUM: 4.5 mmol/L (ref 3.5–5.3)
Sodium: 142 mmol/L (ref 135–146)
TOTAL PROTEIN: 6 g/dL — AB (ref 6.1–8.1)
Total Bilirubin: 0.4 mg/dL (ref 0.2–1.2)

## 2015-08-06 LAB — CBC WITH DIFFERENTIAL/PLATELET
BASOS PCT: 0 %
Basophils Absolute: 0 cells/uL (ref 0–200)
EOS PCT: 1 %
Eosinophils Absolute: 87 cells/uL (ref 15–500)
HCT: 41.4 % (ref 35.0–45.0)
Hemoglobin: 13.6 g/dL (ref 12.0–15.0)
LYMPHS PCT: 36 %
Lymphs Abs: 3132 cells/uL (ref 850–3900)
MCH: 29.7 pg (ref 27.0–33.0)
MCHC: 32.9 g/dL (ref 32.0–36.0)
MCV: 90.4 fL (ref 80.0–100.0)
MONO ABS: 696 {cells}/uL (ref 200–950)
MONOS PCT: 8 %
MPV: 11.7 fL (ref 7.5–12.5)
NEUTROS PCT: 55 %
Neutro Abs: 4785 cells/uL (ref 1500–7800)
PLATELETS: 209 10*3/uL (ref 140–400)
RBC: 4.58 MIL/uL (ref 3.80–5.10)
RDW: 13.5 % (ref 11.0–15.0)
WBC: 8.7 10*3/uL (ref 3.8–10.8)

## 2015-08-06 NOTE — Progress Notes (Signed)
   Subjective:    Patient ID: Alexis Davis, female    DOB: 05/22/1940, 75 y.o.   MRN: [id number]  Patient presents for Generalized Weakness and B Arm Pain   Pt here for F/U feels weak and fatigued since COPD exacerbation in May , initally had more URI symptoms at end of April, then May with exacerbation treated with nebs, Steroids, had recently completed antibiotics. Also on anti-histamines for allergy component. She states that she finds herself falling asleep during the day even though she sleeps well at night time. She tries to get up and do things around the house he just feels like she has no energy and feels drying therefore she has not been doing her regular cleaning and cooking like previous. She does not have any particular pain with the exception of some tenderness and some small superficial veins on both of her forearms. She states when she leans over on a cabinet over the sink she gets some soreness in her arms. She feels like her breathing is actually back to baseline. She is not having chest pain no headaches no dizzy spells.  meds reviewed     Review Of Systems:  GEN- + fatigue, fever, weight loss,weakness, recent illness HEENT- denies eye drainage, change in vision, nasal discharge, CVS- denies chest pain, palpitations RESP- denies SOB, cough, wheeze ABD- denies N/V, change in stools, abd pain GU- denies dysuria, hematuria, dribbling, incontinence MSK- denies joint pain, muscle aches, injury Neuro- denies headache, dizziness, syncope, seizure activity       Objective:    BP 138/74 mmHg  Pulse 72  Temp(Src) 98.6 F (37 C) (Oral)  Resp 16  Ht 5\' 4"  (1.626 m)  Wt 176 lb (79.833 kg)  BMI 30.20 kg/m2  SpO2 95% GEN- NAD, alert and oriented x3 HEENT- PERRL, EOMI, non injected sclera, pink conjunctiva, MMM, oropharynx clear Neck- Supple, no thyromegaly CVS- RRR, no murmur RESP-CTAB,walking sat 96%  ABD-NABS,soft,NT,ND EXT- No edema bilat forearms small  superficial vein, mild TTP left vein, no erythema, no cords palpated  Pulses- Radial, DP- 2+        Assessment & Plan:      Problem List Items Addressed This Visit    ESSENTIAL HYPERTENSION, BENIGN - Primary    Well controlled, no change to meds       Relevant Orders   CBC with Differential/Platelet   Comprehensive metabolic panel   TSH    Other Visit Diagnoses    Vitamin D deficiency        With fatigue at this age, MTF,recent illness back to back, also history of B12 def. She is not on her vitamin D. Check labs, no red flags on exam today     Relevant Orders    Vitamin D, 25-hydroxy    Other fatigue        Relevant Orders    TSH    Vitamin B12    Painful veins        possible mild inflammation on oral NSAID, add topical, no sign of clot or infection, also to avoid direct pressure on veins on arm       Note: This dictation was prepared with Dragon dictation along with smaller phrase technology. Any transcriptional errors that result from this process are unintentional.

## 2015-08-06 NOTE — Assessment & Plan Note (Signed)
Well controlled, no change to meds 

## 2015-08-06 NOTE — Patient Instructions (Signed)
Continue current medications We will call with labs Try topical asprecreme for inflammation around the veins  F/U as needed

## 2015-08-07 LAB — TSH: TSH: 0.95 m[IU]/L

## 2015-08-07 LAB — VITAMIN B12: Vitamin B-12: 426 pg/mL (ref 200–1100)

## 2015-08-07 LAB — VITAMIN D 25 HYDROXY (VIT D DEFICIENCY, FRACTURES): VIT D 25 HYDROXY: 25 ng/mL — AB (ref 30–100)

## 2015-08-08 ENCOUNTER — Other Ambulatory Visit: Payer: Self-pay | Admitting: *Deleted

## 2015-08-08 MED ORDER — VITAMIN B 12 100 MCG PO LOZG: LOZENGE | ORAL | Status: DC

## 2015-08-08 MED ORDER — VITAMIN D (ERGOCALCIFEROL) 1.25 MG (50000 UNIT) PO CAPS: ORAL_CAPSULE | ORAL | Status: DC

## 2015-09-06 LAB — STREP GROUP A AG, W/REFLEX TO CULT: STREGTOCOCCUS GROUP A AG SCREEN: NOT DETECTED

## 2015-11-13 ENCOUNTER — Other Ambulatory Visit: Payer: Self-pay | Admitting: Family Medicine

## 2016-01-14 ENCOUNTER — Ambulatory Visit (INDEPENDENT_AMBULATORY_CARE_PROVIDER_SITE_OTHER): Payer: Medicare Other | Admitting: Family Medicine

## 2016-01-14 ENCOUNTER — Encounter: Payer: Self-pay | Admitting: Family Medicine

## 2016-01-14 VITALS — BP 142/84 | HR 60 | Temp 98.4°F | Resp 14 | Ht 64.0 in | Wt 183.0 lb

## 2016-01-14 DIAGNOSIS — J209 Acute bronchitis, unspecified: Secondary | ICD-10-CM

## 2016-01-14 MED ORDER — BENZONATATE 100 MG PO CAPS: 100.0000 mg | ORAL_CAPSULE | Freq: Three times a day (TID) | ORAL | 0 refills | Status: DC | PRN

## 2016-01-14 MED ORDER — PREDNISONE 20 MG PO TABS: 40.0000 mg | ORAL_TABLET | Freq: Every day | ORAL | 0 refills | Status: DC

## 2016-01-14 NOTE — Progress Notes (Signed)
   Subjective:    Patient ID: Alexis Davis, female    DOB: October 21, 1940, 75 y.o.   MRN: [id number]  Patient presents for Illness (x1 week- productive cough with green mucus, chest congestion, pain in chest d/t cough, fever)                              Patient here with cough with mild production at this point clear mucus. She also has some chest tightness and wheezing. Mild nasal discharge. No fever. She does get some wheezing in the evening time. She's been using Tylenol over-the-counter cough medicine and cough drops, she tried inhaler a few times . He does have underlying mild COPD. She quit smoking years ago   Review Of Systems:  GEN- denies fatigue, fever, weight loss,weakness, recent illness HEENT- denies eye drainage, change in vision, +nasal discharge, CVS- denies chest pain, palpitations RESP- denies SOB, +cough, +wheeze ABD- denies N/V, change in stools, abd pain GU- denies dysuria, hematuria, dribbling, incontinence MSK- denies joint pain, muscle aches, injury Neuro- denies headache, dizziness, syncope, seizure activity       Objective:    BP (!) 142/84 (BP Location: Left Arm, Patient Position: Sitting, Cuff Size: Large)   Pulse 60   Temp 98.4 F (36.9 C) (Oral)   Resp 14   Ht 5\' 4"  (1.626 m)   Wt 183 lb (83 kg)   SpO2 99%   BMI 31.41 kg/m  GEN- NAD, alert and oriented x3 HEENT- PERRL, EOMI, non injected sclera, pink conjunctiva, MMM, oropharynx clear, nares clear rhinorrhea Neck- Supple, no LAD  CVS- RRR, no murmur RESP-CTAB EXT- No edema Pulses- Radial  2+        Assessment & Plan:      Problem List Items Addressed This Visit    None    Visit Diagnoses    Acute bronchitis, unspecified organism    -  Primary   Viral illness, now with bronchitis symptoms, She actually looks fairly well today, but high risk for decompensation with COPD history. Given Prednisone, tessalon, continue albuterol as needed      Note: This dictation was prepared with  Dragon dictation along with smaller phrase technology. Any transcriptional errors that result from this process are unintentional.

## 2016-01-14 NOTE — Patient Instructions (Addendum)
Take cough pills Take steroid as prescribed F/U 6 months

## 2016-01-21 ENCOUNTER — Ambulatory Visit (INDEPENDENT_AMBULATORY_CARE_PROVIDER_SITE_OTHER): Payer: Medicare Other | Admitting: Family Medicine

## 2016-01-21 ENCOUNTER — Encounter: Payer: Self-pay | Admitting: Family Medicine

## 2016-01-21 VITALS — BP 140/72 | HR 66 | Temp 97.5°F | Resp 16 | Ht 64.0 in | Wt 181.0 lb

## 2016-01-21 DIAGNOSIS — J441 Chronic obstructive pulmonary disease with (acute) exacerbation: Secondary | ICD-10-CM | POA: Diagnosis not present

## 2016-01-21 MED ORDER — GUAIFENESIN-CODEINE 100-10 MG/5ML PO SOLN: 5.0000 mL | Freq: Four times a day (QID) | ORAL | 0 refills | Status: DC | PRN

## 2016-01-21 MED ORDER — ALBUTEROL SULFATE HFA 108 (90 BASE) MCG/ACT IN AERS: 2.0000 | INHALATION_SPRAY | RESPIRATORY_TRACT | 1 refills | Status: DC | PRN

## 2016-01-21 MED ORDER — PREDNISONE 10 MG PO TABS: ORAL_TABLET | ORAL | 0 refills | Status: DC

## 2016-01-21 MED ORDER — AZITHROMYCIN 250 MG PO TABS: ORAL_TABLET | ORAL | 0 refills | Status: DC

## 2016-01-21 NOTE — Patient Instructions (Addendum)
Mucinex during the day  Use the prescription cough medicine at night due to codiene Take the antibiotics Take prednisone  Use the albuterol every 4 hours  F/u AS NEEDED

## 2016-01-21 NOTE — Progress Notes (Signed)
   Subjective:    Patient ID: Alexis Davis, female    DOB: 07/19/1940, 75 y.o.   MRN: [id number]  Patient presents for Illness (bronchitis- was seen last week - given tessalon and steroid with no relief)   Pt here with continued cough, wheezing, worse at night  diagnosed with bronchitis, 1 week ago, has underlying COPD though mild and few exacerbations. Given albuterol inhaler, predisone and tessalon perrles.  She's not had any fever   Review Of Systems:  GEN- denies fatigue, fever, weight loss,weakness, recent illness HEENT- denies eye drainage, change in vision, nasal discharge, CVS- denies chest pain, palpitations RESP- denies SOB, +cough,+ wheeze ABD- denies N/V, change in stools, abd pain GU- denies dysuria, hematuria, dribbling, incontinence MSK- denies joint pain, muscle aches, injury Neuro- denies headache, dizziness, syncope, seizure activity       Objective:    BP 140/72 (BP Location: Left Arm, Patient Position: Sitting, Cuff Size: Large)   Pulse 66   Temp 97.5 F (36.4 C) (Oral)   Resp 16   Ht 5\' 4"  (1.626 m)   Wt 181 lb (82.1 kg)   SpO2 98%   BMI 31.07 kg/m  GEN- NAD, alert and oriented x3 HEENT- PERRL, EOMI, non injected sclera, pink conjunctiva, MMM, oropharynx clear Neck- Supple, no LAD  CVS- RRR, no murmur RESP- rhonchi bilat, few scattered wheeze, normal WOB, no retractions, good air movement  EXT- No edema Pulses- Radial, DP- 2+        Assessment & Plan:      Problem List Items Addressed This Visit    None    Visit Diagnoses    COPD exacerbation ([place])    -  Primary   Treat with longer course of steroid, add zpak, given codiene cough medicine for bedtime , mucinex to be used during day , refilled albuterol   Relevant Medications   predniSONE (DELTASONE) 10 MG tablet   azithromycin (ZITHROMAX) 250 MG tablet   albuterol (PROVENTIL HFA;VENTOLIN HFA) 108 (90 Base) MCG/ACT inhaler      Note: This dictation was prepared with Dragon  dictation along with smaller phrase technology. Any transcriptional errors that result from this process are unintentional.

## 2016-02-27 ENCOUNTER — Other Ambulatory Visit: Payer: Self-pay | Admitting: Family Medicine

## 2016-03-23 ENCOUNTER — Ambulatory Visit (INDEPENDENT_AMBULATORY_CARE_PROVIDER_SITE_OTHER): Payer: Medicare Other | Admitting: Family Medicine

## 2016-03-23 ENCOUNTER — Encounter: Payer: Self-pay | Admitting: Family Medicine

## 2016-03-23 VITALS — BP 138/78 | HR 80 | Temp 97.9°F | Resp 18 | Ht 64.0 in | Wt 182.0 lb

## 2016-03-23 DIAGNOSIS — B349 Viral infection, unspecified: Secondary | ICD-10-CM | POA: Diagnosis not present

## 2016-03-23 DIAGNOSIS — J449 Chronic obstructive pulmonary disease, unspecified: Secondary | ICD-10-CM

## 2016-03-23 MED ORDER — TIOTROPIUM BROMIDE MONOHYDRATE 1.25 MCG/ACT IN AERS: 2.0000 | INHALATION_SPRAY | Freq: Every day | RESPIRATORY_TRACT | 0 refills | Status: DC

## 2016-03-23 MED ORDER — ONDANSETRON HCL 4 MG PO TABS: 4.0000 mg | ORAL_TABLET | Freq: Three times a day (TID) | ORAL | 0 refills | Status: DC | PRN

## 2016-03-23 NOTE — Patient Instructions (Signed)
Take the zofran for nausea Try the spiriva, take 2 puffs once a day  Robitussin for cough F/U as previous

## 2016-03-23 NOTE — Assessment & Plan Note (Signed)
This past year has had more exacerbations, often after common colds Given Spiriva low dose to try once a daily

## 2016-03-23 NOTE — Progress Notes (Signed)
   Subjective:    Patient ID: Alexis Davis, female    DOB: [birth year], 76 y.o.   MRN: [id number]  Patient presents for Illness (cough, diarrhea, N/V since Thursday)   75.y.o. Female history of mild COPD, last treated for exacerbation in Nov 2017.  Thursday she started with N/V multiple times, this lasted about 48 hours, appetite has still be down but tolerating fluids, had boiled potatoes yesterday.Emesis NB/NB, diarrhea NB Yesterday also started with cough, but minimal production per pt, no fever, no wheezing, no chest tightness.      Review Of Systems:  GEN- denies fatigue, fever, weight loss,weakness, recent illness HEENT- denies eye drainage, change in vision, nasal discharge, CVS- denies chest pain, palpitations RESP- denies SOB, +cough, denies wheeze ABD- +N/V, +change in stools, +abd pain GU- denies dysuria, hematuria, dribbling, incontinence MSK- denies joint pain, muscle aches, injury Neuro- denies headache, dizziness, syncope, seizure activity       Objective:    BP 138/78 (BP Location: Left Arm, Patient Position: Sitting, Cuff Size: Large)   Pulse 80   Temp 97.9 F (36.6 C) (Oral)   Resp 18   Ht 5\' 4"  (1.626 m)   Wt 182 lb (82.6 kg)   SpO2 97%   BMI 31.24 kg/m  GEN- NAD, alert and oriented x3 HEENT- PERRL, EOMI, non injected sclera, pink conjunctiva, MMM, oropharynx clear,nares clear  Neck- Supple, noLAD  CVS- RRR, no murmur RESP-CTAB ABD-NABS,soft,mild TTP epigastric region, no gaurding, no rebound EXT- No edema Pulses- Radial,2+        Assessment & Plan:      Problem List Items Addressed This Visit    COPD, mild ([place])    This past year has had more exacerbations, often after common colds Given Spiriva low dose to try once a daily      Relevant Medications   Tiotropium Bromide Monohydrate (SPIRIVA RESPIMAT) 1.25 MCG/ACT AERS    Other Visit Diagnoses    Viral illness    -  Primary   Viral illness, with GI and respiratory symptoms, no  sign of actual COPD exacerbation today. Given zofran to help with nausea. bland diet. NON TOXIC appearing      Note: This dictation was prepared with Dragon dictation along with smaller phrase technology. Any transcriptional errors that result from this process are unintentional.

## 2016-06-22 ENCOUNTER — Ambulatory Visit (INDEPENDENT_AMBULATORY_CARE_PROVIDER_SITE_OTHER): Payer: Medicare Other | Admitting: Family Medicine

## 2016-06-22 ENCOUNTER — Encounter: Payer: Self-pay | Admitting: Family Medicine

## 2016-06-22 VITALS — BP 136/82 | HR 58 | Temp 98.1°F | Resp 16 | Ht 64.0 in | Wt 180.0 lb

## 2016-06-22 DIAGNOSIS — J301 Allergic rhinitis due to pollen: Secondary | ICD-10-CM

## 2016-06-22 DIAGNOSIS — E559 Vitamin D deficiency, unspecified: Secondary | ICD-10-CM | POA: Diagnosis not present

## 2016-06-22 DIAGNOSIS — R5383 Other fatigue: Secondary | ICD-10-CM | POA: Diagnosis not present

## 2016-06-22 MED ORDER — FLUTICASONE PROPIONATE 50 MCG/ACT NA SUSP: 2.0000 | Freq: Every day | NASAL | 6 refills | Status: DC

## 2016-06-22 MED ORDER — ALENDRONATE SODIUM 70 MG PO TABS: ORAL_TABLET | ORAL | 11 refills | Status: DC

## 2016-06-22 MED ORDER — LORATADINE 10 MG PO TABS: 10.0000 mg | ORAL_TABLET | Freq: Every day | ORAL | 6 refills | Status: DC

## 2016-06-22 NOTE — Progress Notes (Signed)
   Subjective:    Patient ID: Alexis Davis, female    DOB: Nov 26, 1940, 76 y.o.   MRN: [id number]  Patient presents for Seasonal Allergies (cough/ congestion from pollen) and Weakness (states that she feels weak)    Here with cough and congestion from allergies. She has not tried any antihistamine. But has used nasal saline She has not had any significant wheezing is still taking her inhalers. No shortness of breath. She feels like there is something caught in her throat which makes her cough.   She also just feels weak and has no energy that she is not having particular pain anywhere and is not having fever no chills.  Occasionally her vision is blurry but states his daily life and since she had her cataract surgery and she has a follow-up appointment with her eye doctor for this. She denies any vertigo-like symptoms no nausea vomiting associated    Review Of Systems:  GEN- denies fatigue, fever, weight loss,+weakness, recent illness HEENT- denies eye drainage, +change in vision, nasal discharge, CVS- denies chest pain, palpitations RESP- denies SOB, +cough, wheeze ABD- denies N/V, change in stools, abd pain GU- denies dysuria, hematuria, dribbling, incontinence MSK- denies joint pain, muscle aches, injury Neuro- denies headache, dizziness, syncope, seizure activity       Objective:    BP 136/82   Pulse (!) 58   Temp 98.1 F (36.7 C) (Oral)   Resp 16   Ht 5\' 4"  (1.626 m)   Wt 180 lb (81.6 kg)   SpO2 98%   BMI 30.90 kg/m  GEN- NAD, alert and oriented x3,well appearing  HEENT- PERRL, EOMI, non injected sclera, pink conjunctiva, MMM, oropharynx clear, nares clear drainage , no maxillary sinus tenderness Neck- Supple, no thyromegaly CVS- RR HR  60 no murmur RESP-CTAB ABD-NABS,soft,NT,ND [place]-CNII-XII in tact no focal deficits EXT- No edema Pulses- Radial, DP- 2+        Assessment & Plan:      Problem List Items Addressed This Visit    Vitamin D deficiency     Check vitamin d levels Her weakness is very non specific, could not get a good grasp boiled down to her not having energy. We'll check her vitamin stores. We'll check her for anemia and metabolic panel. Her heart rate is low normal but she has had this in the past and is not on any rate controlling medications. No sign of any overt infection today. I will go ahead and treat her allergies with antihistamine and nasal steroid.  With regard to her vision changes no signs of any stroke no neurological deficits. She states has been like this since her cataract surgery. She is not describing vertigo or have her follow-up with her ophthalmologist regarding this.       Relevant Orders   Vitamin D, 25-hydroxy    Other Visit Diagnoses    Seasonal allergic rhinitis due to pollen    -  Primary   Other fatigue       Relevant Orders   CBC with Differential/Platelet   Comprehensive metabolic panel   Vitamin X32   TSH      Note: This dictation was prepared with Dragon dictation along with smaller phrase technology. Any transcriptional errors that result from this process are unintentional.

## 2016-06-22 NOTE — Patient Instructions (Addendum)
We will call with results Get allergy spray and clartin pill FU 4 months for Physical

## 2016-06-22 NOTE — Assessment & Plan Note (Signed)
Check vitamin d levels Her weakness is very non specific, could not get a good grasp boiled down to her not having energy. We'll check her vitamin stores. We'll check her for anemia and metabolic panel. Her heart rate is low normal but she has had this in the past and is not on any rate controlling medications. No sign of any overt infection today. I will go ahead and treat her allergies with antihistamine and nasal steroid.  With regard to her vision changes no signs of any stroke no neurological deficits. She states has been like this since her cataract surgery. She is not describing vertigo or have her follow-up with her ophthalmologist regarding this.

## 2016-06-23 ENCOUNTER — Other Ambulatory Visit: Payer: Self-pay | Admitting: *Deleted

## 2016-06-23 LAB — CBC WITH DIFFERENTIAL/PLATELET
BASOS ABS: 83 {cells}/uL (ref 0–200)
Basophils Relative: 1 %
EOS ABS: 249 {cells}/uL (ref 15–500)
EOS PCT: 3 %
HCT: 40.2 % (ref 35.0–45.0)
HEMOGLOBIN: 13.1 g/dL (ref 12.0–15.0)
LYMPHS ABS: 3071 {cells}/uL (ref 850–3900)
Lymphocytes Relative: 37 %
MCH: 29.7 pg (ref 27.0–33.0)
MCHC: 32.6 g/dL (ref 32.0–36.0)
MCV: 91.2 fL (ref 80.0–100.0)
MONO ABS: 664 {cells}/uL (ref 200–950)
MPV: 11.9 fL (ref 7.5–12.5)
Monocytes Relative: 8 %
NEUTROS ABS: 4233 {cells}/uL (ref 1500–7800)
Neutrophils Relative %: 51 %
Platelets: 217 10*3/uL (ref 140–400)
RBC: 4.41 MIL/uL (ref 3.80–5.10)
RDW: 13.8 % (ref 11.0–15.0)
WBC: 8.3 10*3/uL (ref 3.8–10.8)

## 2016-06-23 LAB — COMPREHENSIVE METABOLIC PANEL
ALBUMIN: 3.5 g/dL — AB (ref 3.6–5.1)
ALT: 11 U/L (ref 6–29)
AST: 15 U/L (ref 10–35)
Alkaline Phosphatase: 77 U/L (ref 33–130)
BILIRUBIN TOTAL: 0.2 mg/dL (ref 0.2–1.2)
BUN: 13 mg/dL (ref 7–25)
CHLORIDE: 109 mmol/L (ref 98–110)
CO2: 26 mmol/L (ref 20–31)
Calcium: 9.6 mg/dL (ref 8.6–10.4)
Creat: 0.67 mg/dL (ref 0.60–0.93)
Glucose, Bld: 110 mg/dL — ABNORMAL HIGH (ref 70–99)
Potassium: 4.2 mmol/L (ref 3.5–5.3)
Sodium: 143 mmol/L (ref 135–146)
TOTAL PROTEIN: 5.9 g/dL — AB (ref 6.1–8.1)

## 2016-06-23 LAB — VITAMIN B12: VITAMIN B 12: 395 pg/mL (ref 200–1100)

## 2016-06-23 LAB — VITAMIN D 25 HYDROXY (VIT D DEFICIENCY, FRACTURES): VIT D 25 HYDROXY: 21 ng/mL — AB (ref 30–100)

## 2016-06-23 LAB — TSH: TSH: 0.98 mIU/L

## 2016-06-23 MED ORDER — VITAMIN D (ERGOCALCIFEROL) 1.25 MG (50000 UNIT) PO CAPS: 50000.0000 [IU] | ORAL_CAPSULE | ORAL | 0 refills | Status: DC

## 2016-07-03 DIAGNOSIS — Z961 Presence of intraocular lens: Secondary | ICD-10-CM | POA: Diagnosis not present

## 2016-07-17 ENCOUNTER — Ambulatory Visit (INDEPENDENT_AMBULATORY_CARE_PROVIDER_SITE_OTHER): Payer: Medicare Other | Admitting: Family Medicine

## 2016-07-17 ENCOUNTER — Ambulatory Visit (HOSPITAL_COMMUNITY)
Admission: RE | Admit: 2016-07-17 | Discharge: 2016-07-17 | Disposition: A | Discharge status: Home / Self Care | Payer: Medicare Other | Source: Ambulatory Visit | Attending: Family Medicine | Admitting: Family Medicine

## 2016-07-17 ENCOUNTER — Encounter: Payer: Self-pay | Admitting: Family Medicine

## 2016-07-17 VITALS — BP 134/72 | HR 58 | Temp 97.9°F | Resp 16 | Ht 64.0 in | Wt 180.0 lb

## 2016-07-17 DIAGNOSIS — R05 Cough: Secondary | ICD-10-CM | POA: Diagnosis not present

## 2016-07-17 DIAGNOSIS — J449 Chronic obstructive pulmonary disease, unspecified: Secondary | ICD-10-CM

## 2016-07-17 MED ORDER — GUAIFENESIN-CODEINE 100-10 MG/5ML PO SOLN: 5.0000 mL | Freq: Four times a day (QID) | ORAL | 0 refills | Status: DC | PRN

## 2016-07-17 NOTE — Progress Notes (Signed)
   Subjective:    Patient ID: Alexis Davis, female    DOB: 06/11/1940, 76 y.o.   MRN: [id number]  Patient presents for Cough (x2 months- nonprodicutive cough)   Issue here with cough with occasional production. States she will sometimes get green phlegm. She has known COPD. She's been on Spiriva since January but states she does not tell a difference. She also has underlying allergic rhinitis which she has been taking her allergy medication. She has occasional wheeze sometimes at nighttime. She will use her albuterol inhaler as needed. She denies any shortness of breath no fever. She will let the Robitussin with codeine refilled. Previous Smoker   Review Of Systems:  GEN- denies fatigue, fever, weight loss,weakness, recent illness HEENT- denies eye drainage, change in vision, nasal discharge, CVS- denies chest pain, palpitations RESP- denies SOB,+ cough, +wheeze ABD- denies N/V, change in stools, abd pain GU- denies dysuria, hematuria, dribbling, incontinence MSK- denies joint pain, muscle aches, injury Neuro- denies headache, dizziness, syncope, seizure activity       Objective:    BP 134/72   Pulse (!) 58   Temp 97.9 F (36.6 C) (Oral)   Resp 16   Ht 5\' 4"  (1.626 m)   Wt 180 lb (81.6 kg)   SpO2 98%   BMI 30.90 kg/m  GEN- NAD, alert and oriented x3 HEENT- PERRL, EOMI, non injected sclera, pink conjunctiva, MMM, oropharynx clear,+ rhinorrhea nares  Neck- Supple, no LAD  CVS- RRR, no murmur RESP- scattered wheeze, normal WOB, no rales  EXT- No edema Pulses- Radial  2+        Assessment & Plan:      Problem List Items Addressed This Visit    COPD, mild ([place]) - Primary    I think cough is due to her COPD, exacerbated by the allergies. Will obtain CXR none recently previously smoker. Given Breo today to try instead of Spiriva. Results are Robitussin with codeine. Pending results will see if there is any sign of infection around the pathology in the lungs. She  continue the allergy medications.      Relevant Medications   guaiFENesin-codeine 100-10 MG/5ML syrup   Other Relevant Orders   DG Chest 2 View (Completed)      Note: This dictation was prepared with Dragon dictation along with smaller phrase technology. Any transcriptional errors that result from this process are unintentional.

## 2016-07-17 NOTE — Patient Instructions (Addendum)
Try the Breo Take the cough medicine  Get the chest xray F/U pending results

## 2016-07-17 NOTE — Assessment & Plan Note (Signed)
I think cough is due to her COPD, exacerbated by the allergies. Will obtain CXR none recently previously smoker. Given Breo today to try instead of Spiriva. Results are Robitussin with codeine. Pending results will see if there is any sign of infection around the pathology in the lungs. She continue the allergy medications.

## 2016-07-31 ENCOUNTER — Encounter: Payer: Self-pay | Admitting: Family Medicine

## 2016-07-31 ENCOUNTER — Ambulatory Visit (INDEPENDENT_AMBULATORY_CARE_PROVIDER_SITE_OTHER): Payer: Medicare Other | Admitting: Family Medicine

## 2016-07-31 VITALS — BP 130/72 | HR 60 | Temp 97.7°F | Resp 14 | Ht 64.0 in | Wt 179.0 lb

## 2016-07-31 DIAGNOSIS — J0100 Acute maxillary sinusitis, unspecified: Secondary | ICD-10-CM | POA: Diagnosis not present

## 2016-07-31 MED ORDER — FLUTICASONE FUROATE-VILANTEROL 100-25 MCG/INH IN AEPB: 1.0000 | INHALATION_SPRAY | Freq: Every day | RESPIRATORY_TRACT | 6 refills | Status: DC

## 2016-07-31 MED ORDER — METHYLPREDNISOLONE 4 MG PO TBPK: ORAL_TABLET | ORAL | 0 refills | Status: DC

## 2016-07-31 MED ORDER — AMOXICILLIN 875 MG PO TABS: 875.0000 mg | ORAL_TABLET | Freq: Two times a day (BID) | ORAL | 0 refills | Status: DC

## 2016-07-31 NOTE — Progress Notes (Signed)
   Subjective:    Patient ID: Alexis Davis, female    DOB: [birth year], 76 y.o.   MRN: [id number]  Patient presents for Balance Issues (x2 weeks- reports that she has ear pressure and neck pain, states that her head feels heavy)  Past 2 weeks, neck discomfort in front, mild sore throat,ear pain, headache, feels swimmy headed some nasal drainage. Has taken claritin and flonaes , no meds for headache. Has to sit down when she gets dizzy. Was feeling sick while in [place], returned on Tuesday of this week. No fever, no cough. NO falls   Recently saw Eye doctor, told everything was normal   [facility] Dance has helped, needs script   Review Of Systems: per above   GEN- denies fatigue, fever, weight loss+,weakness, recent illness HEENT- denies eye drainage, change in vision, nasal discharge, CVS- denies chest pain, palpitations RESP- denies SOB, cough, wheeze ABD- denies N/V, change in stools, abd pain GU- denies dysuria, hematuria, dribbling, incontinence MSK- denies joint pain, muscle aches, injury Neuro- denies headache, +dizziness, syncope, seizure activity       Objective:    BP 130/72   Pulse 60   Temp 97.7 F (36.5 C) (Oral)   Resp 14   Ht 5\' 4"  (1.626 m)   Wt 179 lb (81.2 kg)   SpO2 97%   BMI 30.73 kg/m  GEN- NAD, alert and oriented x3 HEENT- PERRL, EOMI, non injected sclera, pink conjunctiva, MMM, oropharynx clear , TM clear bilat no effusion,  + mild maxillary sinus tenderness,+  Nasal drainage  Neck- Supple, +ant and submandibular LAD, C spine NT  CVS- RRR, no murmur RESP-CTAB NEURO-CNII-XII in tact  EXT- No edema Pulses- Radial 2+         Assessment & Plan:      Problem List Items Addressed This Visit    None    Visit Diagnoses    Acute non-recurrent maxillary sinusitis    -  Primary   Dizzines may be more related to the sinus pressure, no signs of vertigo, neurologially in tact, treat sinus infection/also given medrol dosepak for inflammation Neck pain is  not over C spine but the nodes    Relevant Medications   amoxicillin (AMOXIL) 875 MG tablet   methylPREDNISolone (MEDROL DOSEPAK) 4 MG TBPK tablet      Note: This dictation was prepared with Dragon dictation along with smaller phrase technology. Any transcriptional errors that result from this process are unintentional.

## 2016-07-31 NOTE — Patient Instructions (Signed)
Take antibiotics and steroids as prescribed Call me if not improved  F/U as needed

## 2016-08-25 DIAGNOSIS — H26493 Other secondary cataract, bilateral: Secondary | ICD-10-CM | POA: Diagnosis not present

## 2016-08-25 DIAGNOSIS — H40013 Open angle with borderline findings, low risk, bilateral: Secondary | ICD-10-CM | POA: Diagnosis not present

## 2016-09-08 ENCOUNTER — Ambulatory Visit (INDEPENDENT_AMBULATORY_CARE_PROVIDER_SITE_OTHER): Payer: Medicare Other | Admitting: Family Medicine

## 2016-09-08 ENCOUNTER — Encounter: Payer: Self-pay | Admitting: Family Medicine

## 2016-09-08 VITALS — BP 126/80 | HR 60 | Temp 97.8°F | Resp 16 | Ht 64.0 in | Wt 179.0 lb

## 2016-09-08 DIAGNOSIS — L299 Pruritus, unspecified: Secondary | ICD-10-CM | POA: Diagnosis not present

## 2016-09-08 LAB — CBC WITH DIFFERENTIAL/PLATELET
BASOS ABS: 84 {cells}/uL (ref 0–200)
Basophils Relative: 1 %
EOS ABS: 84 {cells}/uL (ref 15–500)
EOS PCT: 1 %
HCT: 43.9 % (ref 35.0–45.0)
Hemoglobin: 14.1 g/dL (ref 12.0–15.0)
LYMPHS PCT: 31 %
Lymphs Abs: 2604 cells/uL (ref 850–3900)
MCH: 29.9 pg (ref 27.0–33.0)
MCHC: 32.1 g/dL (ref 32.0–36.0)
MCV: 93 fL (ref 80.0–100.0)
MONOS PCT: 9 %
MPV: 11.8 fL (ref 7.5–12.5)
Monocytes Absolute: 756 cells/uL (ref 200–950)
NEUTROS ABS: 4872 {cells}/uL (ref 1500–7800)
Neutrophils Relative %: 58 %
PLATELETS: 204 10*3/uL (ref 140–400)
RBC: 4.72 MIL/uL (ref 3.80–5.10)
RDW: 13.6 % (ref 11.0–15.0)
WBC: 8.4 10*3/uL (ref 3.8–10.8)

## 2016-09-08 MED ORDER — HYDROXYZINE HCL 25 MG PO TABS
25.0000 mg | ORAL_TABLET | Freq: Three times a day (TID) | ORAL | 0 refills | Status: DC | PRN
Start: 2016-09-08 — End: 2017-03-29

## 2016-09-08 MED ORDER — METHYLPREDNISOLONE ACETATE 40 MG/ML IJ SUSP
60.0000 mg | Freq: Once | INTRAMUSCULAR | Status: AC
  Administered 2016-09-08: 60 mg via INTRAMUSCULAR

## 2016-09-08 NOTE — Addendum Note (Signed)
Addended by: Shary Decamp B on: 09/08/2016 11:33 AM   Modules accepted: Orders

## 2016-09-08 NOTE — Progress Notes (Signed)
Subjective:    Patient ID: Alexis Davis, female    DOB: 04-Mar-1940, 76 y.o.   MRN: [id number]  HPI The patient is a very pleasant 76 year old African-American female who is complaining of itching all over her body. It is worse near her cervical spine and in her posterior shoulders. However is on the dorsums of both arms as well as on her torso. She describes it as diffuse itching. There is no visible rash. She states that it began after she started taking the amoxicillin. She just recently finished the amoxicillin she was given for a sinus infection. She attributes to the medication although there is no swelling in the lips, there is no urticaria, there are no hives. She lives with her husband. He is not itching. She denies any exposure to scabies that she is aware of. She does take gabapentin for "arthritis in her back". She denies any history of nerve damage Past Medical History:  Diagnosis Date  . Arthritis    Shoulder  . Compression fracture of L4 lumbar vertebra (HCC)    Pt did not have pain, osteoporosis  . COPD (chronic obstructive pulmonary disease) ([place]) 2010   PFT  . Hepatic cyst   . Hypertension   . Osteoporosis    Past Surgical History:  Procedure Laterality Date  . ABDOMINAL HYSTERECTOMY    . CATARACT EXTRACTION W/PHACO Right 08/23/2014   Procedure: CATARACT EXTRACTION PHACO AND INTRAOCULAR LENS PLACEMENT RIGHT EYE; CDE:  10.80;  Surgeon: Tonny Branch, MD;  Location: [facility];  Service: Ophthalmology;  Laterality: Right;  . CATARACT EXTRACTION W/PHACO Left 10/08/2014   Procedure: CATARACT EXTRACTION PHACO AND INTRAOCULAR LENS PLACEMENT (IOC);  Surgeon: Tonny Branch, MD;  Location: [facility];  Service: Ophthalmology;  Laterality: Left;  CDE 6.60   Current Outpatient Prescriptions on File Prior to Visit  Medication Sig Dispense Refill  . albuterol (PROVENTIL HFA;VENTOLIN HFA) 108 (90 Base) MCG/ACT inhaler Inhale 2 puffs into the lungs every 4 (four) hours as needed for wheezing or  shortness of breath. 1 Inhaler 1  . alendronate (FOSAMAX) 70 MG tablet TAKE ONE TABLET BY MOUTH ONCE WEEKLY. TAKE WITH FULL GLASS OF WATER ON AN EMPTY STOMACH. 4 tablet 11  . Calcium Carbonate-Vitamin D (CALTRATE 600+D PO) Take 1 tablet by mouth 2 (two) times daily.    . clotrimazole-betamethasone (LOTRISONE) cream Apply 1 application topically 2 (two) times daily. 45 g 2  . COD LIVER OIL PO Take by mouth. 4 tablets daily    . Cyanocobalamin (VITAMIN B 12) 100 MCG LOZG Take one PO QD    . diclofenac (CATAFLAM) 50 MG tablet Take 1 tablet (50 mg total) by mouth 2 (two) times daily. 60 tablet 2  . fluticasone (FLONASE) 50 MCG/ACT nasal spray Place 2 sprays into both nostrils daily. 16 g 6  . fluticasone furoate-vilanterol (BREO ELLIPTA) 100-25 MCG/INH AEPB Inhale 1 puff into the lungs daily. 30 each 6  . gabapentin (NEURONTIN) 100 MG capsule Take 1-2 capsules at bedtime for nerve pain 60 capsule 3  . hydrochlorothiazide (HYDRODIURIL) 25 MG tablet Take 1 tablet (25 mg total) by mouth daily. 30 tablet 11  . loratadine (CLARITIN) 10 MG tablet Take 1 tablet (10 mg total) by mouth daily. 30 tablet 6  . terbinafine (LAMISIL) 1 % cream Apply 1 application topically 2 (two) times daily. 42 g 1  . traMADol (ULTRAM) 50 MG tablet Take 1 tablet (50 mg total) by mouth every 8 (eight) hours as needed. 30 tablet 1  .  Vitamin D, Ergocalciferol, (DRISDOL) 50000 units CAPS capsule Take 1 capsule (50,000 Units total) by mouth every 7 (seven) days. Then D/C. Resume OTC Vit D after course completed. 12 capsule 0   No current facility-administered medications on file prior to visit.    No Known Allergies Social History   Social History  . Marital status: Married    Spouse name: N/A  . Number of children: N/A  . Years of education: 12   Occupational History  . Not on file.   Social History Main Topics  . Smoking status: Former Research scientist (life sciences)  . Smokeless tobacco: Never Used     Comment: does smoke every now and then    . Alcohol use No  . Drug use: No  . Sexual activity: Yes   Other Topics Concern  . Not on file   Social History Narrative  . No narrative on file      Review of Systems  All other systems reviewed and are negative.      Objective:   Physical Exam  Constitutional: She appears well-developed and well-nourished. No distress.  Eyes: Conjunctivae are normal.  Neck: Neck supple.  Cardiovascular: Normal rate, regular rhythm and normal heart sounds.   Pulmonary/Chest: Effort normal and breath sounds normal. No stridor. No respiratory distress. She has no wheezes. She has no rales. She exhibits no tenderness.  Lymphadenopathy:    She has no cervical adenopathy.  Skin: No rash noted. She is not diaphoretic. No erythema.          Assessment & Plan:  [place] - Plan: CBC with Differential/Platelet, COMPLETE METABOLIC PANEL WITH GFR  It is possible that this could be some type of reaction to the antibiotic given the fact he just started when she finished the medication. She is no longer on the amoxicillin. We will treat her as such with a shot of Depo-Medrol 60 mg IM 1 and hydroxyzine 25 mg every 8 hours as needed for itching. I cautioned the patient that hydroxyzine can cause sedation and to be careful with the medication. I will also check a CMP to monitor her renal and liver function test. I will check a CBC to evaluate for any evidence of polycythemia or bone marrow abnormalities. If itching persists, consider scabies versus neuropathic itching as a possible cause given the fact there is no rash.

## 2016-09-09 LAB — COMPLETE METABOLIC PANEL WITH GFR
ALT: 12 U/L (ref 6–29)
AST: 16 U/L (ref 10–35)
Albumin: 3.9 g/dL (ref 3.6–5.1)
Alkaline Phosphatase: 74 U/L (ref 33–130)
BUN: 11 mg/dL (ref 7–25)
CHLORIDE: 106 mmol/L (ref 98–110)
CO2: 25 mmol/L (ref 20–31)
Calcium: 9.6 mg/dL (ref 8.6–10.4)
Creat: 0.69 mg/dL (ref 0.60–0.93)
GFR, EST NON AFRICAN AMERICAN: 84 mL/min (ref >=60)
GLUCOSE: 125 mg/dL — AB (ref 70–99)
POTASSIUM: 4.9 mmol/L (ref 3.5–5.3)
SODIUM: 141 mmol/L (ref 135–146)
TOTAL PROTEIN: 6.1 g/dL (ref 6.1–8.1)
Total Bilirubin: 0.3 mg/dL (ref 0.2–1.2)

## 2016-09-14 DIAGNOSIS — H524 Presbyopia: Secondary | ICD-10-CM | POA: Diagnosis not present

## 2017-03-26 ENCOUNTER — Telehealth: Payer: Self-pay | Admitting: Family Medicine

## 2017-03-26 DIAGNOSIS — I10 Essential (primary) hypertension: Secondary | ICD-10-CM

## 2017-03-26 MED ORDER — HYDROCHLOROTHIAZIDE 25 MG PO TABS: 25.0000 mg | ORAL_TABLET | Freq: Every day | ORAL | 11 refills | Status: DC

## 2017-03-26 NOTE — Telephone Encounter (Signed)
Prescription sent to pharmacy.   Appointment scheduled.  

## 2017-03-26 NOTE — Telephone Encounter (Signed)
Pt requesting refill on hydrochlorothiazide sent to [facility], pt has been without her bp med for 4 days and her bp is 149/105.

## 2017-03-29 ENCOUNTER — Ambulatory Visit: Payer: Medicare Other | Admitting: Family Medicine

## 2017-03-29 ENCOUNTER — Encounter: Payer: Self-pay | Admitting: Family Medicine

## 2017-03-29 ENCOUNTER — Other Ambulatory Visit: Payer: Self-pay

## 2017-03-29 VITALS — BP 140/88 | HR 68 | Temp 98.0°F | Resp 14 | Ht 64.0 in | Wt 180.0 lb

## 2017-03-29 DIAGNOSIS — J449 Chronic obstructive pulmonary disease, unspecified: Secondary | ICD-10-CM

## 2017-03-29 DIAGNOSIS — E538 Deficiency of other specified B group vitamins: Secondary | ICD-10-CM | POA: Diagnosis not present

## 2017-03-29 DIAGNOSIS — I10 Essential (primary) hypertension: Secondary | ICD-10-CM

## 2017-03-29 DIAGNOSIS — G629 Polyneuropathy, unspecified: Secondary | ICD-10-CM | POA: Diagnosis not present

## 2017-03-29 MED ORDER — CLOTRIMAZOLE-BETAMETHASONE 1-0.05 % EX CREA: 1.0000 "application " | TOPICAL_CREAM | Freq: Two times a day (BID) | CUTANEOUS | 2 refills | Status: DC

## 2017-03-29 MED ORDER — TRAMADOL HCL 50 MG PO TABS: 50.0000 mg | ORAL_TABLET | Freq: Three times a day (TID) | ORAL | 0 refills | Status: DC | PRN

## 2017-03-29 MED ORDER — GABAPENTIN 100 MG PO CAPS: ORAL_CAPSULE | ORAL | 3 refills | Status: DC

## 2017-03-29 NOTE — Progress Notes (Signed)
   Subjective:    Patient ID: Alexis Davis, female    DOB: 1941-01-05, 77 y.o.   MRN: [id number]  Patient presents for Elevated BP (was out of meds for a week) and Leg Pain (states that legs "burn" from knees to ankle)  Pt here to f/u HTN, out of bp medications for the past week, HCTZ, had a headache the other day, no dizziness. The [facility] nurse came Tuesday and her bp was up, she then realised she didn't have her pill  Took one this morning   Burning in lower legs, from knees down, started last week. Had a sore on leftleg, has had this sensation in the past. He soaked feet in vinegar last night which helped Looked in previous notes presented in 2014 with neuropathy symptoms, also in 2016, PAD ruled out. Never had nerve conduction Was on gabapentin for a while then symptoms resolved Occasional back pain, no radiating symptoms, no recent falls   COPD- doing well on Breo   Review Of Systems: per above   GEN- denies fatigue, fever, weight loss,weakness, recent illness HEENT- denies eye drainage, change in vision, nasal discharge, CVS- denies chest pain, palpitations RESP- denies SOB, cough, wheeze ABD- denies N/V, change in stools, abd pain GU- denies dysuria, hematuria, dribbling, incontinence MSK- + joint pain, muscle aches, injury Neuro- denies headache, dizziness, syncope, seizure activity       Objective:    BP 140/88   Pulse 68   Temp 98 F (36.7 C) (Oral)   Resp 14   Ht 5\' 4"  (1.626 m)   Wt 180 lb (81.6 kg)   SpO2 99%   BMI 30.90 kg/m  GEN- NAD, alert and oriented x3 HEENT- PERRL, EOMI, non injected sclera, pink conjunctiva, MMM, oropharynx clear Neck- Supple, no thyromegaly CVS- RRR, no murmur RESP-CTAB ABD-NABS,soft,NT,ND MSK- fair ROM spine, HIPS, KNEES, neg SLR Neuro- normal tone LE, sensation grossly in tact, normal monofilament bilat soles, normal gait  Skin- abrasion left leg, no erythema  EXT- No edema Pulses- Radial, DP- 2+        Assessment &  Plan:      Problem List Items Addressed This Visit      Unprioritized   ESSENTIAL HYPERTENSION, BENIGN - Primary    BP looks good for age, no change to HCTZ, she did restart BP med this morning      Relevant Orders   CBC with Differential/Platelet   Comprehensive metabolic panel   Lipid panel   COPD, mild (HCC)    Controlled with Breo       Other Visit Diagnoses    Peripheral polyneuropathy       She has had neuropathic symptoms in past as well, no known diabetes, but will chek fasting blood sugar, B12. Send for nerve conduction, more stocking formation, restart gabapentin 100mg  at bedtime for now   Relevant Medications   gabapentin (NEURONTIN) 100 MG capsule   Other Relevant Orders   Vitamin B12   Nerve conduction test      Note: This dictation was prepared with Dragon dictation along with smaller phrase technology. Any transcriptional errors that result from this process are unintentional.

## 2017-03-29 NOTE — Assessment & Plan Note (Signed)
Controlled with [facility]

## 2017-03-29 NOTE — Assessment & Plan Note (Signed)
BP looks good for age, no change to HCTZ, she did restart BP med this morning

## 2017-03-29 NOTE — Patient Instructions (Signed)
Take 1 capsule at bedtime for nerve pain Continue blood pressure medicine Continue your inhalers Nerve test to be done  F/U pending results

## 2017-03-30 ENCOUNTER — Ambulatory Visit: Payer: Self-pay | Admitting: Family Medicine

## 2017-03-30 LAB — CBC WITH DIFFERENTIAL/PLATELET
BASOS ABS: 56 {cells}/uL (ref 0–200)
Basophils Relative: 0.6 %
EOS ABS: 94 {cells}/uL (ref 15–500)
Eosinophils Relative: 1 %
HEMATOCRIT: 42.1 % (ref 35.0–45.0)
Hemoglobin: 14.3 g/dL (ref 11.7–15.5)
LYMPHS ABS: 2764 {cells}/uL (ref 850–3900)
MCH: 30.6 pg (ref 27.0–33.0)
MCHC: 34 g/dL (ref 32.0–36.0)
MCV: 90 fL (ref 80.0–100.0)
MPV: 12.1 fL (ref 7.5–12.5)
Monocytes Relative: 11.3 %
Neutro Abs: 5424 cells/uL (ref 1500–7800)
Neutrophils Relative %: 57.7 %
Platelets: 203 10*3/uL (ref 140–400)
RBC: 4.68 10*6/uL (ref 3.80–5.10)
RDW: 12.2 % (ref 11.0–15.0)
Total Lymphocyte: 29.4 %
WBC: 9.4 10*3/uL (ref 3.8–10.8)
WBCMIX: 1062 {cells}/uL — AB (ref 200–950)

## 2017-03-30 LAB — COMPREHENSIVE METABOLIC PANEL
AG RATIO: 1.8 (calc) (ref 1.0–2.5)
ALKALINE PHOSPHATASE (APISO): 76 U/L (ref 33–130)
ALT: 12 U/L (ref 6–29)
AST: 16 U/L (ref 10–35)
Albumin: 3.9 g/dL (ref 3.6–5.1)
BILIRUBIN TOTAL: 0.4 mg/dL (ref 0.2–1.2)
BUN: 14 mg/dL (ref 7–25)
CALCIUM: 9.7 mg/dL (ref 8.6–10.4)
CO2: 30 mmol/L (ref 20–32)
Chloride: 107 mmol/L (ref 98–110)
Creat: 0.81 mg/dL (ref 0.60–0.93)
Globulin: 2.2 g/dL (calc) (ref 1.9–3.7)
Glucose, Bld: 76 mg/dL (ref 65–99)
Potassium: 5.2 mmol/L (ref 3.5–5.3)
SODIUM: 143 mmol/L (ref 135–146)
TOTAL PROTEIN: 6.1 g/dL (ref 6.1–8.1)

## 2017-03-30 LAB — LIPID PANEL
Cholesterol: 164 mg/dL (ref <200)
HDL: 48 mg/dL — ABNORMAL LOW (ref >50)
LDL Cholesterol (Calc): 96 mg/dL (calc)
NON-HDL CHOLESTEROL (CALC): 116 mg/dL (ref <130)
Total CHOL/HDL Ratio: 3.4 (calc) (ref <5.0)
Triglycerides: 105 mg/dL (ref <150)

## 2017-03-30 LAB — VITAMIN B12: VITAMIN B 12: 655 pg/mL (ref 200–1100)

## 2017-04-01 ENCOUNTER — Encounter: Payer: Self-pay | Admitting: *Deleted

## 2017-04-28 DIAGNOSIS — R202 Paresthesia of skin: Secondary | ICD-10-CM | POA: Diagnosis not present

## 2017-04-28 DIAGNOSIS — M545 Low back pain: Secondary | ICD-10-CM | POA: Diagnosis not present

## 2017-05-03 ENCOUNTER — Other Ambulatory Visit (HOSPITAL_COMMUNITY): Payer: Self-pay | Admitting: Orthopaedic Surgery

## 2017-05-03 DIAGNOSIS — R202 Paresthesia of skin: Secondary | ICD-10-CM

## 2017-05-10 ENCOUNTER — Ambulatory Visit (HOSPITAL_COMMUNITY)
Admission: RE | Admit: 2017-05-10 | Discharge: 2017-05-10 | Disposition: A | Discharge status: Home / Self Care | Payer: Medicare Other | Source: Ambulatory Visit | Attending: Family Medicine | Admitting: Family Medicine

## 2017-05-10 DIAGNOSIS — R202 Paresthesia of skin: Secondary | ICD-10-CM | POA: Diagnosis not present

## 2017-05-10 NOTE — Progress Notes (Signed)
VASCULAR LAB PRELIMINARY  ARTERIAL  ABI completed: ABIs and great toe pressures are within normal limits.     RIGHT    LEFT    PRESSURE WAVEFORM  PRESSURE WAVEFORM  BRACHIAL 180 T BRACHIAL 176 T  DP   DP    AT 177 T AT 157 T  PT 173 B PT 172 T  PER   PER    GREAT TOE 159 NA GREAT TOE 138 NA    RIGHT LEFT  ABI 0.98 0.96     Alexis Davis, RVT 05/10/2017, 11:57 AM

## 2017-05-12 DIAGNOSIS — M545 Low back pain: Secondary | ICD-10-CM | POA: Diagnosis not present

## 2017-11-24 ENCOUNTER — Ambulatory Visit (INDEPENDENT_AMBULATORY_CARE_PROVIDER_SITE_OTHER): Payer: Medicare Other | Admitting: Family Medicine

## 2017-11-24 ENCOUNTER — Encounter: Payer: Self-pay | Admitting: Family Medicine

## 2017-11-24 VITALS — BP 148/88 | HR 70 | Temp 98.4°F | Resp 16 | Ht 64.0 in | Wt 176.0 lb

## 2017-11-24 DIAGNOSIS — E559 Vitamin D deficiency, unspecified: Secondary | ICD-10-CM | POA: Diagnosis not present

## 2017-11-24 DIAGNOSIS — G629 Polyneuropathy, unspecified: Secondary | ICD-10-CM | POA: Diagnosis not present

## 2017-11-24 DIAGNOSIS — R6889 Other general symptoms and signs: Secondary | ICD-10-CM | POA: Diagnosis not present

## 2017-11-24 DIAGNOSIS — L84 Corns and callosities: Secondary | ICD-10-CM | POA: Diagnosis not present

## 2017-11-24 DIAGNOSIS — L602 Onychogryphosis: Secondary | ICD-10-CM

## 2017-11-24 DIAGNOSIS — M899 Disorder of bone, unspecified: Secondary | ICD-10-CM | POA: Diagnosis not present

## 2017-11-24 DIAGNOSIS — D509 Iron deficiency anemia, unspecified: Secondary | ICD-10-CM | POA: Diagnosis not present

## 2017-11-24 MED ORDER — GABAPENTIN 100 MG PO CAPS: 100.0000 mg | ORAL_CAPSULE | Freq: Three times a day (TID) | ORAL | 0 refills | Status: DC

## 2017-11-24 NOTE — Progress Notes (Signed)
Patient ID: Alexis Davis, female    DOB: 20-Aug-1940, 77 y.o.   MRN: [id number]  PCP: Alycia Rossetti, MD  Chief Complaint  Patient presents with  . Feet burning like fire per pt    Subjective:   Alexis Davis is a 77 y.o. female, presents to clinic with CC of bilateral foot pain described as burning.  Has been ongoing for 9-10 months and gradually worsening this past month.  Burning all day and all night - like fires to them.  Pain rated severe, is waking her up at night, moving feet at night, sometimes wrapping up feet with soft blankets.  No other alleviating or aggravating factors.   She has neurontin - prescribed to take 100-200 mg at night, but she is not taking.  She also has some concerns of new peeling right outer edge of foot, heel, and some on toes that is new for the last month.  No noted rash, redness, ulcers or injury. Thickened nails to both feet have gradually worsened this year, she cannot cut/trim them  No other associated sx. She has been seen by her PCP for this same problem.   Nerve conduction test was ordered but never completed  She does have a history of vitamin D deficiency and B12 deficiency.  She has been supplementing as prescribed by her PCP.  She denies any past history of diabetes or symptoms of hypoglycemia including urinary frequency, polyuria, polydipsia, nocturia, weight loss, weakness.  Denies any muscle spasms associated with her symptoms.  Denies any muscle weakness, joint pain, low back pain, saddle anesthesia, continence of stool or bowel.   Patient Active Problem List   Diagnosis Date Noted  . Vitamin D deficiency 06/22/2016  . Varicose veins 03/16/2014  . Tinea pedis 11/03/2013  . OA (osteoarthritis) 11/03/2013  . Osteopenia 12/09/2012  . Itching 12/09/2012  . PTTD (posterior tibial tendon dysfunction) 09/01/2012  . Neuropathy, leg 04/29/2012  . Rotator cuff syndrome of right shoulder 11/26/2011  . Glenohumeral arthritis  11/26/2011  . COPD, mild ([place]) 10/22/2008  . ESSENTIAL HYPERTENSION, BENIGN 09/25/2008  . SHOULDER, ARTHRITIS, DEGEN./OSTEO 06/12/2008  . HEPATIC CYST 03/16/2006  . DEGENERATION, DISC NOS 03/16/2006     Prior to Admission medications   Medication Sig Start Date End Date Taking? Authorizing Provider  albuterol (PROVENTIL HFA;VENTOLIN HFA) 108 (90 Base) MCG/ACT inhaler Inhale 2 puffs into the lungs every 4 (four) hours as needed for wheezing or shortness of breath. 01/21/16  Yes [place], Modena Nunnery, MD  alendronate (FOSAMAX) 70 MG tablet TAKE ONE TABLET BY MOUTH ONCE WEEKLY. TAKE WITH FULL GLASS OF WATER ON AN EMPTY STOMACH. 06/22/16  Yes [place], Modena Nunnery, MD  Calcium Carbonate-Vitamin D (CALTRATE 600+D PO) Take 1 tablet by mouth 2 (two) times daily.   Yes [provider]  clotrimazole-betamethasone (LOTRISONE) cream Apply 1 application topically 2 (two) times daily. 03/29/17  Yes [place], Modena Nunnery, MD  COD LIVER OIL PO Take by mouth. 4 tablets daily   Yes [provider]  Cyanocobalamin (VITAMIN B 12) 100 MCG LOZG Take one PO QD 08/08/15  Yes [place], Modena Nunnery, MD  fluticasone [facility]) 50 MCG/ACT nasal spray Place 2 sprays into both nostrils daily. 06/22/16  Yes [place], Modena Nunnery, MD  fluticasone furoate-vilanterol (BREO ELLIPTA) 100-25 MCG/INH AEPB Inhale 1 puff into the lungs daily. 07/31/16  Yes [place], Modena Nunnery, MD  gabapentin (NEURONTIN) 100 MG capsule Take 1-2 capsules at bedtime for nerve pain 03/29/17  Yes [place],  Modena Nunnery, MD  hydrochlorothiazide (HYDRODIURIL) 25 MG tablet Take 1 tablet (25 mg total) by mouth daily. 03/26/17  Yes [place], Modena Nunnery, MD  loratadine (CLARITIN) 10 MG tablet Take 1 tablet (10 mg total) by mouth daily. 06/22/16  Yes [place], Modena Nunnery, MD  traMADol (ULTRAM) 50 MG tablet Take 1 tablet (50 mg total) by mouth every 8 (eight) hours as needed. For pain 03/29/17  Yes [place], Modena Nunnery, MD     No Known Allergies   Family History  Problem Relation Age  of Onset  . Diabetes Mother   . Heart disease Father   . Hypertension Sister   . Hypertension Brother   . Hypertension Sister   . Hypertension Sister   . Arthritis Unknown      Social History   Socioeconomic History  . Marital status: Married    Spouse name: Not on file  . Number of children: Not on file  . Years of education: 32  . Highest education level: Not on file  Occupational History  . Not on file  Social Needs  . Financial resource strain: Not on file  . Food insecurity:    Worry: Not on file    Inability: Not on file  . Transportation needs:    Medical: Not on file    Non-medical: Not on file  Tobacco Use  . Smoking status: Former Research scientist (life sciences)  . Smokeless tobacco: Never Used  . Tobacco comment: does smoke every now and then  Substance and Sexual Activity  . Alcohol use: No  . Drug use: No  . Sexual activity: Yes  Lifestyle  . Physical activity:    Days per week: Not on file    Minutes per session: Not on file  . Stress: Not on file  Relationships  . Social connections:    Talks on phone: Not on file    Gets together: Not on file    Attends religious service: Not on file    Active member of club or organization: Not on file    Attends meetings of clubs or organizations: Not on file    Relationship status: Not on file  . Intimate partner violence:    Fear of current or ex partner: Not on file    Emotionally abused: Not on file    Physically abused: Not on file    Forced sexual activity: Not on file  Other Topics Concern  . Not on file  Social History Narrative  . Not on file     Review of Systems  Constitutional: Negative.   HENT: Negative.   Eyes: Negative.   Respiratory: Negative.   Cardiovascular: Negative.   Gastrointestinal: Negative.   Endocrine: Negative.   Genitourinary: Negative.   Musculoskeletal: Negative.   Skin: Negative.  Negative for color change and rash.  Allergic/Immunologic: Negative.   Neurological: Negative.  Negative  for dizziness, syncope, weakness and numbness.  Hematological: Negative.  Negative for adenopathy. Does not bruise/bleed easily.  Psychiatric/Behavioral: Negative.   All other systems reviewed and are negative.      Objective:    Vitals:   11/24/17 1051  BP: (!) 148/88  Pulse: 70  Resp: 16  Temp: 98.4 F (36.9 C)  TempSrc: Oral  SpO2: 96%  Weight: 176 lb (79.8 kg)  Height: 5\' 4"  (1.626 m)      Physical Exam  Constitutional: She appears well-developed and well-nourished. No distress.  HENT:  Head: Normocephalic and atraumatic.  Nose: Nose normal.  Eyes: Conjunctivae are  normal. Right eye exhibits no discharge. Left eye exhibits no discharge.  Neck: No tracheal deviation present.  Cardiovascular: Normal rate, regular rhythm, normal heart sounds and intact distal pulses. Exam reveals no gallop and no friction rub.  No murmur heard. Pulses:      Dorsalis pedis pulses are 1+ on the right side, and 1+ on the left side.  No LE edema  Pulmonary/Chest: Effort normal. No stridor. No respiratory distress.  Musculoskeletal: Normal range of motion.       Right foot: There is normal range of motion and no deformity.       Left foot: There is normal range of motion and no deformity.  Feet:  Right Foot:  Skin Integrity: Positive for callus and dry skin. Negative for ulcer, blister, skin breakdown or erythema.  Left Foot:  Skin Integrity: Positive for dry skin. Negative for ulcer, blister, skin breakdown or erythema.  Neurological: She is alert. She has normal strength. She displays no atrophy and no tremor. No sensory deficit. She exhibits normal muscle tone. Coordination and gait normal.  Skin: Skin is warm, dry and intact. Capillary refill takes less than 2 seconds. No rash noted. She is not diaphoretic. No cyanosis or erythema. No pallor. Nails show no clubbing.  Skin to bilateral lower extremities ankles and dorsum of feet bilaterally are normal appearing except for noted dryness  and callus above No ruber, pallor, telangiectasias or varicosities No rash noted to feet or ankles  Psychiatric: She has a normal mood and affect. Her behavior is normal.  Nursing note and vitals reviewed.         Assessment & Plan:      ICD-10-CM   1. Peripheral polyneuropathy G62.9 VITAMIN D 25 Hydroxy (Vit-D Deficiency, Fractures)    B12 and Folate Panel    CBC with Differential    COMPLETE METABOLIC PANEL WITH GFR    Ambulatory referral to Podiatry    gabapentin (NEURONTIN) 100 MG capsule  2. Thickened nails L60.2 Ambulatory referral to Podiatry  3. Callus of foot L84 Ambulatory referral to Podiatry  4. Vitamin D deficiency E55.9 VITAMIN D 25 Hydroxy (Vit-D Deficiency, Fractures)    Pending active nerve study - will follow up, ordered January 2019 for same sx. Pt does not seem to be taking gabapentin- Increase gabapentin gradually 100 mg PO tid and increase in 1-2 weeks 200 mg PO TID  Laboratory done to look for potential deficiencies that may be contributing towards worsening peripheral neuropathy Previously had arterial insufficiency worked up and ruled out Does complain of some peeling and callus numbness on her feet with thickened nails that she can no longer trim herself so will refer to podiatry.  Do not currently see any signs of any fungal infections that could be causing her symptoms Follow up in 1 months, sooner if having problems.   Delsa Grana, PA-C 11/24/17 11:01 AM  Results for orders placed or performed in visit on 11/24/17  VITAMIN D 25 Hydroxy (Vit-D Deficiency, Fractures)  Result Value Ref Range   Vit D, 25-Hydroxy 23 (L) 30 - 100 ng/mL  B12 and Folate Panel  Result Value Ref Range   Vitamin B-12 433 200 - 1,100 pg/mL   Folate 15.3 ng/mL  CBC with Differential  Result Value Ref Range   WBC 7.7 3.8 - 10.8 Thousand/uL   RBC 4.47 3.80 - 5.10 Million/uL   Hemoglobin 13.4 11.7 - 15.5 g/dL   HCT 40.8 35.0 - 45.0 %   MCV 91.3 80.0 -  100.0 fL   MCH  30.0 27.0 - 33.0 pg   MCHC 32.8 32.0 - 36.0 g/dL   RDW 12.6 11.0 - 15.0 %   Platelets 202 140 - 400 Thousand/uL   MPV 12.7 (H) 7.5 - 12.5 fL   Neutro Abs 4,035 1,500 - 7,800 cells/uL   Lymphs Abs 2,695 850 - 3,900 cells/uL   WBC mixed population 847 200 - 950 cells/uL   Eosinophils Absolute 69 15 - 500 cells/uL   Basophils Absolute 54 0 - 200 cells/uL   Neutrophils Relative % 52.4 %   Total Lymphocyte 35.0 %   Monocytes Relative 11.0 %   Eosinophils Relative 0.9 %   Basophils Relative 0.7 %  COMPLETE METABOLIC PANEL WITH GFR  Result Value Ref Range   Glucose, Bld 83 65 - 99 mg/dL   BUN 16 7 - 25 mg/dL   Creat 0.61 0.60 - 0.93 mg/dL   GFR, Est Non African American 87 > OR = 60 mL/min/1.64m2   GFR, Est African American 101 > OR = 60 mL/min/1.53m2   BUN/Creatinine Ratio NOT APPLICABLE 6 - 22 (calc)   Sodium 140 135 - 146 mmol/L   Potassium 5.2 3.5 - 5.3 mmol/L   Chloride 107 98 - 110 mmol/L   CO2 27 20 - 32 mmol/L   Calcium 9.7 8.6 - 10.4 mg/dL   Total Protein 6.1 6.1 - 8.1 g/dL   Albumin 4.1 3.6 - 5.1 g/dL   Globulin 2.0 1.9 - 3.7 g/dL (calc)   AG Ratio 2.1 1.0 - 2.5 (calc)   Total Bilirubin 0.3 0.2 - 1.2 mg/dL   Alkaline phosphatase (APISO) 79 33 - 130 U/L   AST 18 10 - 35 U/L   ALT 11 6 - 29 U/L   Electrolytes normal, good kidney function, good liver function, no anemia, B12 and folate are optimal, vitamin D is still deficient despite supplementation with calcium and vitamin D replacement.  Increase Vit D replacement, then she can resume normal 800 IU / d replacement  Vitamin D (Ergocalciferol) 50000 units Caps capsule Commonly known as:  DRISDOL Take 1 capsule (50,000 Units total) by mouth every 7 (seven) days for 6 doses.

## 2017-11-24 NOTE — Patient Instructions (Addendum)
Gabapentin medicine dose change for nerve pain  Take one pill three times a day. After about a week, if you still have nerve pain increase to two pills three times a day  Follow up if you're having side effects or no improvement on pain, see you in 2-4 weeks.    Neuropathic Pain Neuropathic pain is pain caused by damage to the nerves that are responsible for certain sensations in your body (sensory nerves). The pain can be caused by damage to:  The sensory nerves that send signals to your spinal cord and brain (peripheral nervous system).  The sensory nerves in your brain or spinal cord (central nervous system).  Neuropathic pain can make you more sensitive to pain. What would be a minor sensation for most people may feel very painful if you have neuropathic pain. This is usually a long-term condition that can be difficult to treat. The type of pain can differ from person to person. It may start suddenly (acute), or it may develop slowly and last for a long time (chronic). Neuropathic pain may come and go as damaged nerves heal or may stay at the same level for years. It often causes emotional distress, loss of sleep, and a lower quality of life. What are the causes? The most common cause of damage to a sensory nerve is diabetes. Many other diseases and conditions can also cause neuropathic pain. Causes of neuropathic pain can be classified as:  Toxic. Many drugs and chemicals can cause toxic damage. The most common cause of toxic neuropathic pain is damage from drug treatment for cancer (chemotherapy).  Metabolic. This type of pain can happen when a disease causes imbalances that damage nerves. Diabetes is the most common of these diseases. Vitamin B deficiency caused by long-term alcohol abuse is another common cause.  Traumatic. Any injury that cuts, crushes, or stretches a nerve can cause damage and pain. A common example is feeling pain after losing an arm or leg (phantom limb  pain).  Compression-related. If a sensory nerve gets trapped or compressed for a long period of time, the blood supply to the nerve can be cut off.  Vascular. Many blood vessel diseases can cause neuropathic pain by decreasing blood supply and oxygen to nerves.  Autoimmune. This type of pain results from diseases in which the body's defense system mistakenly attacks sensory nerves. Examples of autoimmune diseases that can cause neuropathic pain include lupus and multiple sclerosis.  Infectious. Many types of viral infections can damage sensory nerves and cause pain. Shingles infection is a common cause of this type of pain.  Inherited. Neuropathic pain can be a symptom of many diseases that are passed down through families (genetic).  What are the signs or symptoms? The main symptom is pain. Neuropathic pain is often described as:  Burning.  Shock-like.  Stinging.  Hot or cold.  Itching.  How is this diagnosed? No single test can diagnose neuropathic pain. Your health care provider will do a physical exam and ask you about your pain. You may use a pain scale to describe how bad your pain is. You may also have tests to see if you have a high sensitivity to pain and to help find the cause and location of any sensory nerve damage. These tests may include:  Imaging studies, such as: ? X-rays. ? CT scan. ? MRI.  Nerve conduction studies to test how well nerve signals travel through your sensory nerves (electrodiagnostic testing).  Stimulating your sensory nerves through electrodes  on your skin and measuring the response in your spinal cord and brain (somatosensory evoked potentials).  How is this treated? Treatment for neuropathic pain may change over time. You may need to try different treatment options or a combination of treatments. Some options include:  Over-the-counter pain relievers.  Prescription medicines. Some medicines used to treat other conditions may also help  neuropathic pain. These include medicines to: ? Control seizures (anticonvulsants). ? Relieve depression (antidepressants).  Prescription-strength pain relievers (narcotics). These are usually used when other pain relievers do not help.  Transcutaneous nerve stimulation (TENS). This uses electrical currents to block painful nerve signals. The treatment is painless.  Topical and local anesthetics. These are medicines that numb the nerves. They can be injected as a nerve block or applied to the skin.  Alternative treatments, such as: ? Acupuncture. ? Meditation. ? Massage. ? Physical therapy. ? Pain management programs. ? Counseling.  Follow these instructions at home:  Learn as much as you can about your condition.  Take medicines only as directed by your health care provider.  Work closely with all your health care providers to find what works best for you.  Have a good support system at home.  Consider joining a chronic pain support group. Contact a health care provider if:  Your pain treatments are not helping.  You are having side effects from your medicines.  You are struggling with fatigue, mood changes, depression, or anxiety. This information is not intended to replace advice given to you by your health care provider. Make sure you discuss any questions you have with your health care provider. Document Released: 11/14/2003 Document Revised: 09/06/2015 Document Reviewed: 07/27/2013 Elsevier Interactive Patient Education  [phone] [place].   Vitamin D Deficiency Vitamin D deficiency is when your body does not have enough vitamin D. Vitamin D is important because:  It helps your body use other minerals that your body needs.  It helps keep your bones strong and healthy.  It may help to prevent some diseases.  It helps your heart and other muscles work well.  You can get vitamin D by:  Eating foods with vitamin D in them.  Drinking or eating milk or  other foods that have had vitamin D added to them.  Taking a vitamin D supplement.  Being in the sun.  Not getting enough vitamin D can make your bones become soft. It can also cause other health problems. Follow these instructions at home:  Take medicines and supplements only as told by your doctor.  Eat foods that have vitamin D. These include: ? Dairy products, cereals, or juices with added vitamin D. Check the label for vitamin D. ? Fatty fish like salmon or trout. ? Eggs. ? Oysters.  Do not use tanning beds.  Stay at a healthy weight. Lose weight, if needed.  Keep all follow-up visits as told by your doctor. This is important. Contact a doctor if:  Your symptoms do not go away.  You feel sick to your stomach (nauseous).  Youthrow up (vomit).  You poop less often than usual or you have trouble pooping (constipation). This information is not intended to replace advice given to you by your health care provider. Make sure you discuss any questions you have with your health care provider. Document Released: 02/05/2011 Document Revised: 07/25/2015 Document Reviewed: 07/04/2014 Elsevier Interactive Patient Education  2018 [employer].   Vitamin B12 and Folate Test Vitamin Y60 and folate (folic acid) are both B vitamins that are  needed to make red blood cells and keep your nervous system healthy. Having normal levels is important. You may be low in these B vitamins if you do not get enough of them in your diet. Folate is found in:  Leafy green vegetables.  Beans.  Fruits.  Grains and cereals that have had folate added (fortified).  Vitamin B12 is found in:  Meats.  Eggs.  Dairy products.  Fish.  Vitamin B12 fortified grains and cereals.  You may also have low levels of these vitamins if you have a digestive system disease that interferes with your ability to absorb them from your food. The most common cause of a vitamin B12 deficiency is the inability to  absorb it (pernicious anemia). You may have this blood test if you have symptoms of a vitamin B12 or folate deficiency. These include:  Fatigue.  Headache.  Confusion.  Sore mouth.  Poor balance.  Tingling or numbness.  You may also have this test if:  You are pregnant or breastfeeding. Women who are pregnant or breastfeeding need more folate and may need to take supplements.  Your red blood cell count is low (anemia).  You are an older person and have mental confusion.  You have a disease or condition that may lead to vitamin B deficiency.  This test requires a blood sample taken from a vein in your hand or arm. The tests for vitamin B12 and folate may be done together or separately. How do I prepare for this test?  You may not be able to eat before the blood test as directed by your health care provider.  Tell your health care provider about: ? All medicines you are taking, including vitamins, herbs, eye drops, creams, and over-the-counter medicines, including medicine for heartburn. Many medicines can affect your B12 and folate levels. ? Medical conditions you have, including if you are or may be pregnant. ? How often you drink alcohol. What do the results mean? It is your responsibility to obtain your test results. Ask the lab or department performing the test when and how you will get your results. Contact your health care provider to discuss any questions you have about your results. The results of this test will be reported as a range of values. Range of Normal Values Ranges for normal values vary among different labs and hospitals. You should always check with your health care provider after having lab work or other tests done to discuss whether your values are considered within normal limits.  The normal range for B12 is 160-950 pg/mL or 118-701 pmol/L (SI units).  The normal range for folate is 5-25 ng/mL or 11-57 mmol/L (SI units).  Meaning of Results Outside  Normal Range Values High levels of vitamin B12 are rare, but may happen if you have:  Cancer.  Diabetes.  Heart failure.  Obesity.  Liver disease.  Human immunodeficiency virus (HIV).  It is more common to have low levels of vitamin B12 and folate. Talk to your health care provider about what your test results mean for you. Some common causes of low vitamin B12 or folate levels include:  Pernicious and other kinds of anemia.  Poor nutrition.  Alcoholism.  Liver disease.  Digestive disease.  Talk with your health care provider to discuss your results, treatment options, and if necessary, the need for more tests. Talk with your health care provider if you have any questions about your results. This information is not intended to replace advice given to you  by your health care provider. Make sure you discuss any questions you have with your health care provider. Document Released: 03/13/2004 Document Revised: 10/23/2015 Document Reviewed: 06/06/2013 Elsevier Interactive Patient Education  [employer].

## 2017-11-25 LAB — CBC WITH DIFFERENTIAL/PLATELET
BASOS ABS: 54 {cells}/uL (ref 0–200)
Basophils Relative: 0.7 %
EOS ABS: 69 {cells}/uL (ref 15–500)
EOS PCT: 0.9 %
HEMATOCRIT: 40.8 % (ref 35.0–45.0)
HEMOGLOBIN: 13.4 g/dL (ref 11.7–15.5)
LYMPHS ABS: 2695 {cells}/uL (ref 850–3900)
MCH: 30 pg (ref 27.0–33.0)
MCHC: 32.8 g/dL (ref 32.0–36.0)
MCV: 91.3 fL (ref 80.0–100.0)
MONOS PCT: 11 %
MPV: 12.7 fL — ABNORMAL HIGH (ref 7.5–12.5)
NEUTROS ABS: 4035 {cells}/uL (ref 1500–7800)
NEUTROS PCT: 52.4 %
Platelets: 202 10*3/uL (ref 140–400)
RBC: 4.47 10*6/uL (ref 3.80–5.10)
RDW: 12.6 % (ref 11.0–15.0)
Total Lymphocyte: 35 %
WBC mixed population: 847 cells/uL (ref 200–950)
WBC: 7.7 10*3/uL (ref 3.8–10.8)

## 2017-11-25 LAB — VITAMIN D 25 HYDROXY (VIT D DEFICIENCY, FRACTURES): VIT D 25 HYDROXY: 23 ng/mL — AB (ref 30–100)

## 2017-11-25 LAB — COMPLETE METABOLIC PANEL WITH GFR
AG RATIO: 2.1 (calc) (ref 1.0–2.5)
ALT: 11 U/L (ref 6–29)
AST: 18 U/L (ref 10–35)
Albumin: 4.1 g/dL (ref 3.6–5.1)
Alkaline phosphatase (APISO): 79 U/L (ref 33–130)
BILIRUBIN TOTAL: 0.3 mg/dL (ref 0.2–1.2)
BUN: 16 mg/dL (ref 7–25)
CHLORIDE: 107 mmol/L (ref 98–110)
CO2: 27 mmol/L (ref 20–32)
Calcium: 9.7 mg/dL (ref 8.6–10.4)
Creat: 0.61 mg/dL (ref 0.60–0.93)
GFR, Est African American: 101 mL/min/{1.73_m2} (ref >=60)
GFR, Est Non African American: 87 mL/min/{1.73_m2} (ref >=60)
GLOBULIN: 2 g/dL (ref 1.9–3.7)
Glucose, Bld: 83 mg/dL (ref 65–99)
Potassium: 5.2 mmol/L (ref 3.5–5.3)
SODIUM: 140 mmol/L (ref 135–146)
Total Protein: 6.1 g/dL (ref 6.1–8.1)

## 2017-11-25 LAB — B12 AND FOLATE PANEL
FOLATE: 15.3 ng/mL
VITAMIN B 12: 433 pg/mL (ref 200–1100)

## 2017-11-25 MED ORDER — VITAMIN D (ERGOCALCIFEROL) 1.25 MG (50000 UNIT) PO CAPS: 50000.0000 [IU] | ORAL_CAPSULE | ORAL | 0 refills | Status: AC

## 2017-12-24 ENCOUNTER — Other Ambulatory Visit: Payer: Self-pay

## 2017-12-24 ENCOUNTER — Ambulatory Visit (INDEPENDENT_AMBULATORY_CARE_PROVIDER_SITE_OTHER): Payer: Medicare Other | Admitting: Family Medicine

## 2017-12-24 ENCOUNTER — Encounter: Payer: Self-pay | Admitting: Family Medicine

## 2017-12-24 VITALS — BP 140/86 | HR 56 | Temp 98.4°F | Resp 14 | Ht 64.0 in | Wt 175.0 lb

## 2017-12-24 DIAGNOSIS — G629 Polyneuropathy, unspecified: Secondary | ICD-10-CM

## 2017-12-24 MED ORDER — GABAPENTIN 300 MG PO CAPS: 300.0000 mg | ORAL_CAPSULE | Freq: Three times a day (TID) | ORAL | 3 refills | Status: DC

## 2017-12-24 NOTE — Assessment & Plan Note (Signed)
Pain improving with gabapentin 300 mg 3 times daily, no SE Nerve conduction study ordered and pending Going to podiatry F/up PCP in 2-3 months

## 2017-12-24 NOTE — Progress Notes (Signed)
Patient ID: Alexis Davis, female    DOB: 1940-12-15, 77 y.o.   MRN: [id number]  PCP: Alycia Rossetti, MD  Chief Complaint  Patient presents with  . Follow-up    Gabapentin for neuropathy- going to see Dr Berline Lopes on 10/29.    Subjective:   Alexis Davis is a 77 y.o. female, presents to clinic with CC of peripheral polyneuropathy follow-up after medication changes.  She was prescribed gabapentin in the past but had not been taking and had severe worsening nerve pain.  She has titrated up from 100 mg of gabapentin 3 times a day to now 300 mg of gabapentin 3 times a day.  She did not not have any adverse side effects.  Her foot and leg pain has improved significantly.  She has no pain throughout the day, and every few nights she will have intermittent episodes of sharp burning pain in her feet the last about 5 minutes.  She has been applying lotions and Vaseline to her feet and the peeling has nearly resolved.  She has no rash, redness or itching of her feet.  She is due to see Dr. Berline Lopes, podiatry, next week. Labs from last month showed suboptimal vitamin D levels with supplementation so she is taking higher vitamin D supplement weekly w/o difficulty.   Patient Active Problem List   Diagnosis Date Noted  . Peripheral polyneuropathy 11/24/2017  . Vitamin D deficiency 06/22/2016  . Varicose veins 03/16/2014  . Tinea pedis 11/03/2013  . OA (osteoarthritis) 11/03/2013  . Osteopenia 12/09/2012  . Itching 12/09/2012  . PTTD (posterior tibial tendon dysfunction) 09/01/2012  . Neuropathy, leg 04/29/2012  . Rotator cuff syndrome of right shoulder 11/26/2011  . Glenohumeral arthritis 11/26/2011  . COPD, mild ([place]) 10/22/2008  . ESSENTIAL HYPERTENSION, BENIGN 09/25/2008  . SHOULDER, ARTHRITIS, DEGEN./OSTEO 06/12/2008  . HEPATIC CYST 03/16/2006  . DEGENERATION, DISC NOS 03/16/2006     Prior to Admission medications   Medication Sig Start Date End Date Taking? Authorizing  Provider  albuterol (PROVENTIL HFA;VENTOLIN HFA) 108 (90 Base) MCG/ACT inhaler Inhale 2 puffs into the lungs every 4 (four) hours as needed for wheezing or shortness of breath. 01/21/16  Yes [place], Modena Nunnery, MD  alendronate (FOSAMAX) 70 MG tablet TAKE ONE TABLET BY MOUTH ONCE WEEKLY. TAKE WITH FULL GLASS OF WATER ON AN EMPTY STOMACH. 06/22/16  Yes [place], Modena Nunnery, MD  Calcium Carbonate-Vitamin D (CALTRATE 600+D PO) Take 1 tablet by mouth 2 (two) times daily.   Yes [provider]  clotrimazole-betamethasone (LOTRISONE) cream Apply 1 application topically 2 (two) times daily. 03/29/17  Yes [place], Modena Nunnery, MD  COD LIVER OIL PO Take by mouth. 4 tablets daily   Yes [provider]  Cyanocobalamin (VITAMIN B 12) 100 MCG LOZG Take one PO QD 08/08/15  Yes [place], Modena Nunnery, MD  fluticasone [facility]) 50 MCG/ACT nasal spray Place 2 sprays into both nostrils daily. 06/22/16  Yes [place], Modena Nunnery, MD  fluticasone furoate-vilanterol (BREO ELLIPTA) 100-25 MCG/INH AEPB Inhale 1 puff into the lungs daily. 07/31/16  Yes [place], Modena Nunnery, MD  gabapentin (NEURONTIN) 100 MG capsule Take 1-3 capsules (100-300 mg total) by mouth 3 (three) times daily. 11/24/17  Yes Delsa Grana, PA-C  hydrochlorothiazide (HYDRODIURIL) 25 MG tablet Take 1 tablet (25 mg total) by mouth daily. 03/26/17  Yes [place], Modena Nunnery, MD  loratadine (CLARITIN) 10 MG tablet Take 1 tablet (10 mg total) by mouth daily. 06/22/16  Yes , Modena Nunnery, MD  traMADol (ULTRAM) 50 MG tablet Take 1 tablet (50 mg total) by mouth every 8 (eight) hours as needed. For pain 03/29/17  Yes [place], Modena Nunnery, MD  Vitamin D, Ergocalciferol, (DRISDOL) 50000 units CAPS capsule Take 1 capsule (50,000 Units total) by mouth every 7 (seven) days for 6 doses. 11/25/17 12/31/17 Yes Delsa Grana, PA-C      No Known Allergies   Family History  Problem Relation Age of Onset  . Diabetes Mother   . Heart disease Father   . Hypertension Sister   .  Hypertension Brother   . Hypertension Sister   . Hypertension Sister   . Arthritis Unknown      Social History   Socioeconomic History  . Marital status: Married    Spouse name: Not on file  . Number of children: Not on file  . Years of education: 69  . Highest education level: Not on file  Occupational History  . Not on file  Social Needs  . Financial resource strain: Not on file  . Food insecurity:    Worry: Not on file    Inability: Not on file  . Transportation needs:    Medical: Not on file    Non-medical: Not on file  Tobacco Use  . Smoking status: Former Research scientist (life sciences)  . Smokeless tobacco: Never Used  . Tobacco comment: does smoke every now and then  Substance and Sexual Activity  . Alcohol use: No  . Drug use: No  . Sexual activity: Yes  Lifestyle  . Physical activity:    Days per week: Not on file    Minutes per session: Not on file  . Stress: Not on file  Relationships  . Social connections:    Talks on phone: Not on file    Gets together: Not on file    Attends religious service: Not on file    Active member of club or organization: Not on file    Attends meetings of clubs or organizations: Not on file    Relationship status: Not on file  . Intimate partner violence:    Fear of current or ex partner: Not on file    Emotionally abused: Not on file    Physically abused: Not on file    Forced sexual activity: Not on file  Other Topics Concern  . Not on file  Social History Narrative  . Not on file     Review of Systems  Constitutional: Negative.   HENT: Negative.   Eyes: Negative.   Respiratory: Negative.   Cardiovascular: Negative.   Gastrointestinal: Negative.   Endocrine: Negative.   Genitourinary: Negative.   Musculoskeletal: Negative.   Skin: Negative.   Allergic/Immunologic: Negative.   Neurological: Negative.   Hematological: Negative.   Psychiatric/Behavioral: Negative.   All other systems reviewed and are negative.      Objective:     Vitals:   12/24/17 1009  BP: 140/86  Pulse: (!) 56  Resp: 14  Temp: 98.4 F (36.9 C)  TempSrc: Oral  SpO2: 98%  Weight: 175 lb (79.4 kg)  Height: 5\' 4"  (1.626 m)      Physical Exam  Constitutional: She appears well-developed and well-nourished. No distress.  HENT:  Head: Normocephalic and atraumatic.  Nose: Nose normal.  Eyes: Conjunctivae are normal. Right eye exhibits no discharge. Left eye exhibits no discharge.  Neck: No tracheal deviation present.  Cardiovascular: Normal rate, regular rhythm, normal heart sounds and intact distal pulses. Exam reveals no gallop and no friction  rub.  No murmur heard. Pulses:      Dorsalis pedis pulses are 1+ on the right side, and 1+ on the left side.  No LE edema  Pulmonary/Chest: Effort normal. No stridor. No respiratory distress.  Musculoskeletal: Normal range of motion.       Right foot: There is normal range of motion and no deformity.       Left foot: There is normal range of motion and no deformity.  Feet:  Right Foot:  Skin Integrity: Positive for callus and dry skin. Negative for ulcer, blister, skin breakdown or erythema.  Left Foot:  Skin Integrity: Positive for dry skin. Negative for ulcer, blister, skin breakdown or erythema.  Neurological: She is alert. She has normal strength. She displays no atrophy and no tremor. No sensory deficit. She exhibits normal muscle tone. Coordination and gait normal.  Normal sensation to light touch in b/l LE  Skin: Skin is warm and intact. Capillary refill takes less than 2 seconds. No rash noted. She is not diaphoretic. No cyanosis or erythema. No pallor. Nails show no clubbing.  Improving dry and peeling skin to right foot/sole   Psychiatric: She has a normal mood and affect. Her behavior is normal.  Nursing note and vitals reviewed.         Assessment & Plan:    Problem List Items Addressed This Visit      Nervous and Auditory   Peripheral polyneuropathy - Primary    Pain  improving with gabapentin 300 mg 3 times daily, no SE Nerve conduction study ordered and pending Going to podiatry F/up PCP in 2-3 months      Relevant Medications   gabapentin (NEURONTIN) 300 MG capsule   Other Relevant Orders   Nerve conduction test   Ambulatory referral to Neurology       Have consulted with referral coordinator to help get prior order for nerve conduction study completed, this has been changed to referral to neurology for same study.  Patient has tolerated the increasing dose of medication, refills put through at 300 mg capsule dose 3 times daily  Strict patient to follow-up with PCP in 1 to 2 months when she is available, notify clinic or come for earlier follow-up appointment if having adverse side effects to medication.  New supplements for nutritional deficiencies.  Foot care as previously discussed and follow-up with podiatry as needed.  Delsa Grana, PA-C 12/24/17 10:35 AM

## 2017-12-28 DIAGNOSIS — M79672 Pain in left foot: Secondary | ICD-10-CM | POA: Diagnosis not present

## 2017-12-28 DIAGNOSIS — M25579 Pain in unspecified ankle and joints of unspecified foot: Secondary | ICD-10-CM | POA: Diagnosis not present

## 2017-12-28 DIAGNOSIS — M79671 Pain in right foot: Secondary | ICD-10-CM | POA: Diagnosis not present

## 2018-01-18 DIAGNOSIS — M79671 Pain in right foot: Secondary | ICD-10-CM | POA: Diagnosis not present

## 2018-01-18 DIAGNOSIS — M25579 Pain in unspecified ankle and joints of unspecified foot: Secondary | ICD-10-CM | POA: Diagnosis not present

## 2018-01-18 DIAGNOSIS — M79672 Pain in left foot: Secondary | ICD-10-CM | POA: Diagnosis not present

## 2018-02-01 DIAGNOSIS — M79671 Pain in right foot: Secondary | ICD-10-CM | POA: Diagnosis not present

## 2018-02-01 DIAGNOSIS — M79672 Pain in left foot: Secondary | ICD-10-CM | POA: Diagnosis not present

## 2018-02-01 DIAGNOSIS — M25579 Pain in unspecified ankle and joints of unspecified foot: Secondary | ICD-10-CM | POA: Diagnosis not present

## 2018-03-15 DIAGNOSIS — M79672 Pain in left foot: Secondary | ICD-10-CM | POA: Diagnosis not present

## 2018-03-15 DIAGNOSIS — M79671 Pain in right foot: Secondary | ICD-10-CM | POA: Diagnosis not present

## 2018-03-15 DIAGNOSIS — M1990 Unspecified osteoarthritis, unspecified site: Secondary | ICD-10-CM | POA: Diagnosis not present

## 2018-03-15 DIAGNOSIS — M25579 Pain in unspecified ankle and joints of unspecified foot: Secondary | ICD-10-CM | POA: Diagnosis not present

## 2018-04-12 DIAGNOSIS — M79672 Pain in left foot: Secondary | ICD-10-CM | POA: Diagnosis not present

## 2018-04-12 DIAGNOSIS — M25579 Pain in unspecified ankle and joints of unspecified foot: Secondary | ICD-10-CM | POA: Diagnosis not present

## 2018-04-12 DIAGNOSIS — M79671 Pain in right foot: Secondary | ICD-10-CM | POA: Diagnosis not present

## 2018-04-28 ENCOUNTER — Other Ambulatory Visit: Payer: Self-pay | Admitting: Family Medicine

## 2018-09-28 ENCOUNTER — Other Ambulatory Visit: Payer: Self-pay

## 2018-09-28 ENCOUNTER — Ambulatory Visit (INDEPENDENT_AMBULATORY_CARE_PROVIDER_SITE_OTHER): Payer: Medicare Other | Admitting: Family Medicine

## 2018-09-28 ENCOUNTER — Encounter: Payer: Self-pay | Admitting: Family Medicine

## 2018-09-28 VITALS — BP 154/82 | HR 68 | Temp 97.8°F | Resp 16 | Ht 64.0 in | Wt 178.0 lb

## 2018-09-28 DIAGNOSIS — M8949 Other hypertrophic osteoarthropathy, multiple sites: Secondary | ICD-10-CM

## 2018-09-28 DIAGNOSIS — E559 Vitamin D deficiency, unspecified: Secondary | ICD-10-CM

## 2018-09-28 DIAGNOSIS — J449 Chronic obstructive pulmonary disease, unspecified: Secondary | ICD-10-CM | POA: Diagnosis not present

## 2018-09-28 DIAGNOSIS — I10 Essential (primary) hypertension: Secondary | ICD-10-CM | POA: Diagnosis not present

## 2018-09-28 DIAGNOSIS — M150 Primary generalized (osteo)arthritis: Secondary | ICD-10-CM

## 2018-09-28 DIAGNOSIS — G629 Polyneuropathy, unspecified: Secondary | ICD-10-CM

## 2018-09-28 DIAGNOSIS — M159 Polyosteoarthritis, unspecified: Secondary | ICD-10-CM

## 2018-09-28 MED ORDER — LORATADINE 10 MG PO TABS: 10.0000 mg | ORAL_TABLET | Freq: Every day | ORAL | 6 refills | Status: DC

## 2018-09-28 MED ORDER — HYDROCHLOROTHIAZIDE 25 MG PO TABS: 25.0000 mg | ORAL_TABLET | Freq: Every day | ORAL | 2 refills | Status: DC

## 2018-09-28 MED ORDER — IPRATROPIUM BROMIDE 0.03 % NA SOLN: 2.0000 | Freq: Two times a day (BID) | NASAL | 2 refills | Status: DC | PRN

## 2018-09-28 MED ORDER — BREO ELLIPTA 100-25 MCG/INH IN AEPB: 1.0000 | INHALATION_SPRAY | Freq: Every day | RESPIRATORY_TRACT | 6 refills | Status: DC

## 2018-09-28 MED ORDER — ALENDRONATE SODIUM 70 MG PO TABS: ORAL_TABLET | ORAL | 6 refills | Status: DC

## 2018-09-28 MED ORDER — TRAMADOL HCL 50 MG PO TABS: 50.0000 mg | ORAL_TABLET | Freq: Three times a day (TID) | ORAL | 1 refills | Status: DC | PRN

## 2018-09-28 MED ORDER — GABAPENTIN 300 MG PO CAPS: 300.0000 mg | ORAL_CAPSULE | Freq: Three times a day (TID) | ORAL | 3 refills | Status: DC

## 2018-09-28 MED ORDER — ALBUTEROL SULFATE HFA 108 (90 BASE) MCG/ACT IN AERS: 2.0000 | INHALATION_SPRAY | RESPIRATORY_TRACT | 1 refills | Status: DC | PRN

## 2018-09-28 NOTE — Assessment & Plan Note (Signed)
COPD is controlled with Breo no change in medication.  For her allergic rhinitis we will try her on Atrovent nasal spray I will also restart her Claritin

## 2018-09-28 NOTE — Assessment & Plan Note (Signed)
I will obtain records from podiatry.  She will continue the gabapentin which is helping

## 2018-09-28 NOTE — Patient Instructions (Addendum)
Call Monday with your blood pressure results We will call with labs For congestion- new nasal spray atrovent, and restart claritin  for dizziness- restart blood pressure medicine, your blood pressure is too high COPD- keeping using your breo inhaler every day in the morning  F/U 3 months for wellness visit

## 2018-09-28 NOTE — Assessment & Plan Note (Signed)
Blood pressure is elevated will restart her hydrochlorothiazide will also check her labs.  She has been off her meds for a month and has been more fatigued and a little woozy.  She will call us on Monday to let us know what her blood pressure readings are and how she is feeling.

## 2018-09-28 NOTE — Assessment & Plan Note (Signed)
Refilled tramadol.

## 2018-09-28 NOTE — Assessment & Plan Note (Signed)
Vitamin D deficiency along with osteoporosis.  We will recheck her levels restart vitamin D supplement

## 2018-09-28 NOTE — Progress Notes (Signed)
Subjective:    Patient ID: Alexis Davis, female    DOB: 09/12/1940, 78 y.o.   MRN: [id number]  Patient presents for Dizziness (x1 week- states that she has dizzines in the morning an feels weak- no specififc time- checking BP and ne=oted elevated)  HTN- 164/90 at home, she hsa been feeling woozy, but not spinning sensation.  She has been out of pills for a month  Peripheral neuropathy she was evaluated by Dr. Berline Lopes states that she had shots in her feet for her neuropathy.  She is still taking gabapentin 3 times a day which does help  Feels weak as well past week   Asthma- she is using breo and has albuterol on hand    She has allergic rhinitis- has clear drainage, down throat into her chest, cant cough it up  no wheezing , shortness of breath she feels her Memory Dance helps with this.  Using flonase this has not helped   out of claritin    She needs her ultram    Vitamin D def - she isnot taking any   She is taking B12 and cod liver pills     Review Of Systems:  GEN- denies fatigue, fever, weight loss,weakness, recent illness HEENT- denies eye drainage, change in vision, nasal discharge, CVS- denies chest pain, palpitations RESP- denies SOB, cough, wheeze ABD- denies N/V, change in stools, abd pain GU- denies dysuria, hematuria, dribbling, incontinence MSK- denies joint pain, muscle aches, injury Neuro- denies headache, +dizziness, syncope, seizure activity       Objective:    BP (!) 154/82   Pulse 68   Temp 97.8 F (36.6 C) (Oral)   Resp 16   Ht 5\' 4"  (1.626 m)   Wt 178 lb (80.7 kg)   SpO2 97%   BMI 30.55 kg/m  GEN- NAD, alert and oriented x3 HEENT- PERRL, EOMI, non injected sclera, pink conjunctiva, MMM, oropharynx clear, nares clear rhinorrhea no sinus tenderness TMs clear canals clear Neck- Supple, no thyromegaly, no JVD CVS- RRR, no murmur RESP-CTAB ABD-NABS,soft,NT,ND EXT- No edema Pulses- Radial, DP- 2+        Assessment & Plan:       Problem List Items Addressed This Visit      Unprioritized   COPD, mild ([place]) - Primary    COPD is controlled with Breo no change in medication.  For her allergic rhinitis we will try her on Atrovent nasal spray I will also restart her Claritin      Relevant Medications   loratadine (CLARITIN) 10 MG tablet   ipratropium (ATROVENT) 0.03 % nasal spray   fluticasone furoate-vilanterol (BREO ELLIPTA) 100-25 MCG/INH AEPB   albuterol (VENTOLIN HFA) 108 (90 Base) MCG/ACT inhaler   ESSENTIAL HYPERTENSION, BENIGN    Blood pressure is elevated will restart her hydrochlorothiazide will also check her labs.  She has been off her meds for a month and has been more fatigued and a little woozy.  She will call us on Monday to let us know what her blood pressure readings are and how she is feeling.      Relevant Medications   hydrochlorothiazide (HYDRODIURIL) 25 MG tablet   Other Relevant Orders   CBC with Differential/Platelet   Comprehensive metabolic panel   Lipid panel   OA (osteoarthritis)    Refilled tramadol      Relevant Medications   traMADol (ULTRAM) 50 MG tablet   Peripheral polyneuropathy    I will obtain records from podiatry.  She will  continue the gabapentin which is helping      Relevant Medications   gabapentin (NEURONTIN) 300 MG capsule   Vitamin D deficiency    Vitamin D deficiency along with osteoporosis.  We will recheck her levels restart vitamin D supplement      Relevant Orders   Vitamin D, 25-hydroxy      Note: This dictation was prepared with Dragon dictation along with smaller phrase technology. Any transcriptional errors that result from this process are unintentional.

## 2018-09-29 LAB — CBC WITH DIFFERENTIAL/PLATELET
Absolute Monocytes: 1020 cells/uL — ABNORMAL HIGH (ref 200–950)
Basophils Absolute: 61 cells/uL (ref 0–200)
Basophils Relative: 0.6 %
Eosinophils Absolute: 101 cells/uL (ref 15–500)
Eosinophils Relative: 1 %
HCT: 42.1 % (ref 35.0–45.0)
Hemoglobin: 13.8 g/dL (ref 11.7–15.5)
Lymphs Abs: 2868 cells/uL (ref 850–3900)
MCH: 30.1 pg (ref 27.0–33.0)
MCHC: 32.8 g/dL (ref 32.0–36.0)
MCV: 91.7 fL (ref 80.0–100.0)
MPV: 12.9 fL — ABNORMAL HIGH (ref 7.5–12.5)
Monocytes Relative: 10.1 %
Neutro Abs: 6050 cells/uL (ref 1500–7800)
Neutrophils Relative %: 59.9 %
Platelets: 193 10*3/uL (ref 140–400)
RBC: 4.59 10*6/uL (ref 3.80–5.10)
RDW: 12.7 % (ref 11.0–15.0)
Total Lymphocyte: 28.4 %
WBC: 10.1 10*3/uL (ref 3.8–10.8)

## 2018-09-29 LAB — COMPREHENSIVE METABOLIC PANEL
AG Ratio: 1.9 (calc) (ref 1.0–2.5)
ALT: 10 U/L (ref 6–29)
AST: 14 U/L (ref 10–35)
Albumin: 3.9 g/dL (ref 3.6–5.1)
Alkaline phosphatase (APISO): 80 U/L (ref 37–153)
BUN: 11 mg/dL (ref 7–25)
CO2: 28 mmol/L (ref 20–32)
Calcium: 10.4 mg/dL (ref 8.6–10.4)
Chloride: 108 mmol/L (ref 98–110)
Creat: 0.68 mg/dL (ref 0.60–0.93)
Globulin: 2.1 g/dL (calc) (ref 1.9–3.7)
Glucose, Bld: 96 mg/dL (ref 65–99)
Potassium: 5.1 mmol/L (ref 3.5–5.3)
Sodium: 142 mmol/L (ref 135–146)
Total Bilirubin: 0.3 mg/dL (ref 0.2–1.2)
Total Protein: 6 g/dL — ABNORMAL LOW (ref 6.1–8.1)

## 2018-09-29 LAB — LIPID PANEL
Cholesterol: 163 mg/dL (ref <200)
HDL: 49 mg/dL — ABNORMAL LOW (ref >=50)
LDL Cholesterol (Calc): 87 mg/dL (calc)
Non-HDL Cholesterol (Calc): 114 mg/dL (calc) (ref <130)
Total CHOL/HDL Ratio: 3.3 (calc) (ref <5.0)
Triglycerides: 175 mg/dL — ABNORMAL HIGH (ref <150)

## 2018-09-29 LAB — VITAMIN D 25 HYDROXY (VIT D DEFICIENCY, FRACTURES): Vit D, 25-Hydroxy: 22 ng/mL — ABNORMAL LOW (ref 30–100)

## 2018-10-06 ENCOUNTER — Telehealth: Payer: Self-pay

## 2018-10-06 IMAGING — DX DG CHEST 2V
2 series · 2 of 2 positions shown · non-contrast
Comparison: No recent prior .

CLINICAL DATA: Productive cough.  COPD .

EXAM:
CHEST  2 VIEW

[chest pa]
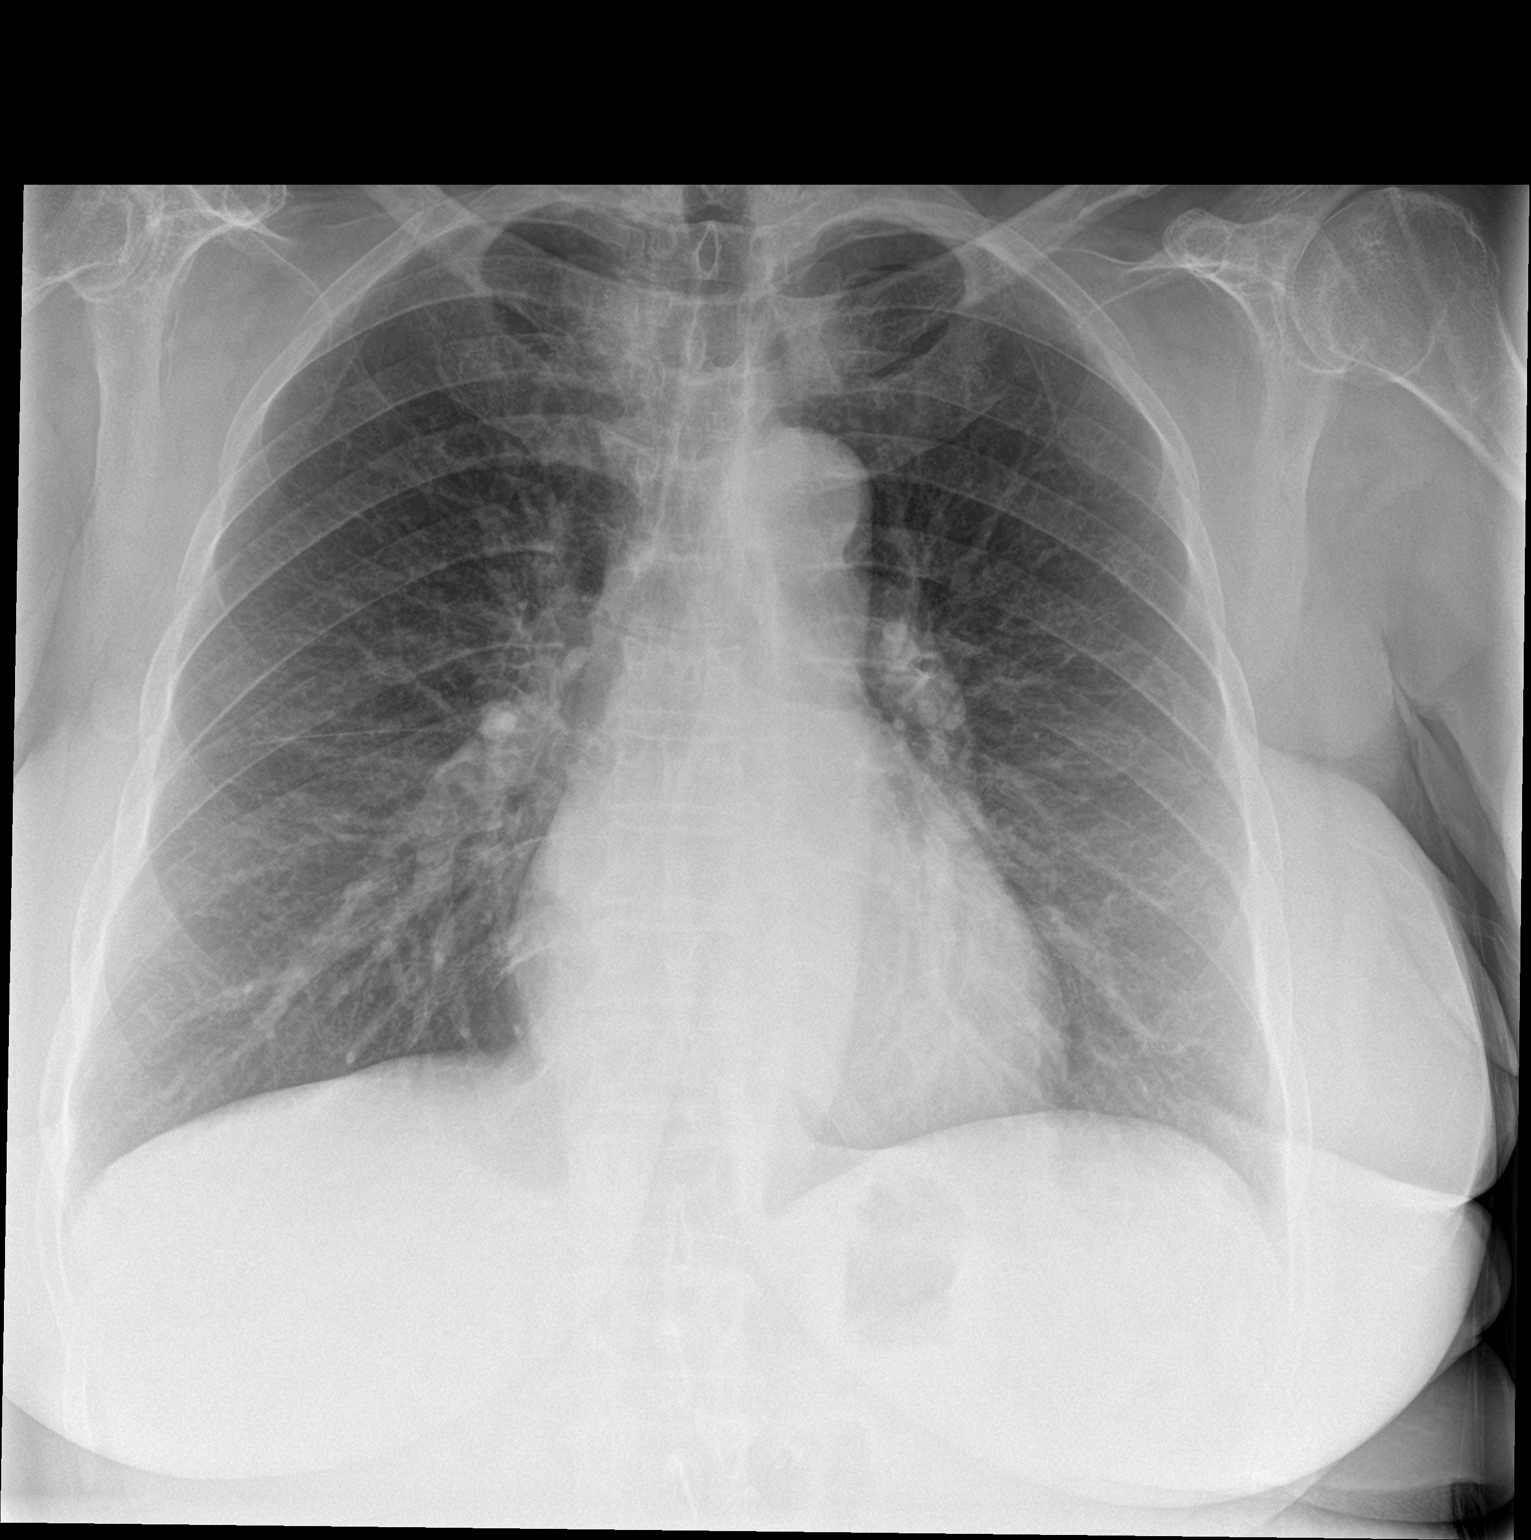

[chest lat]
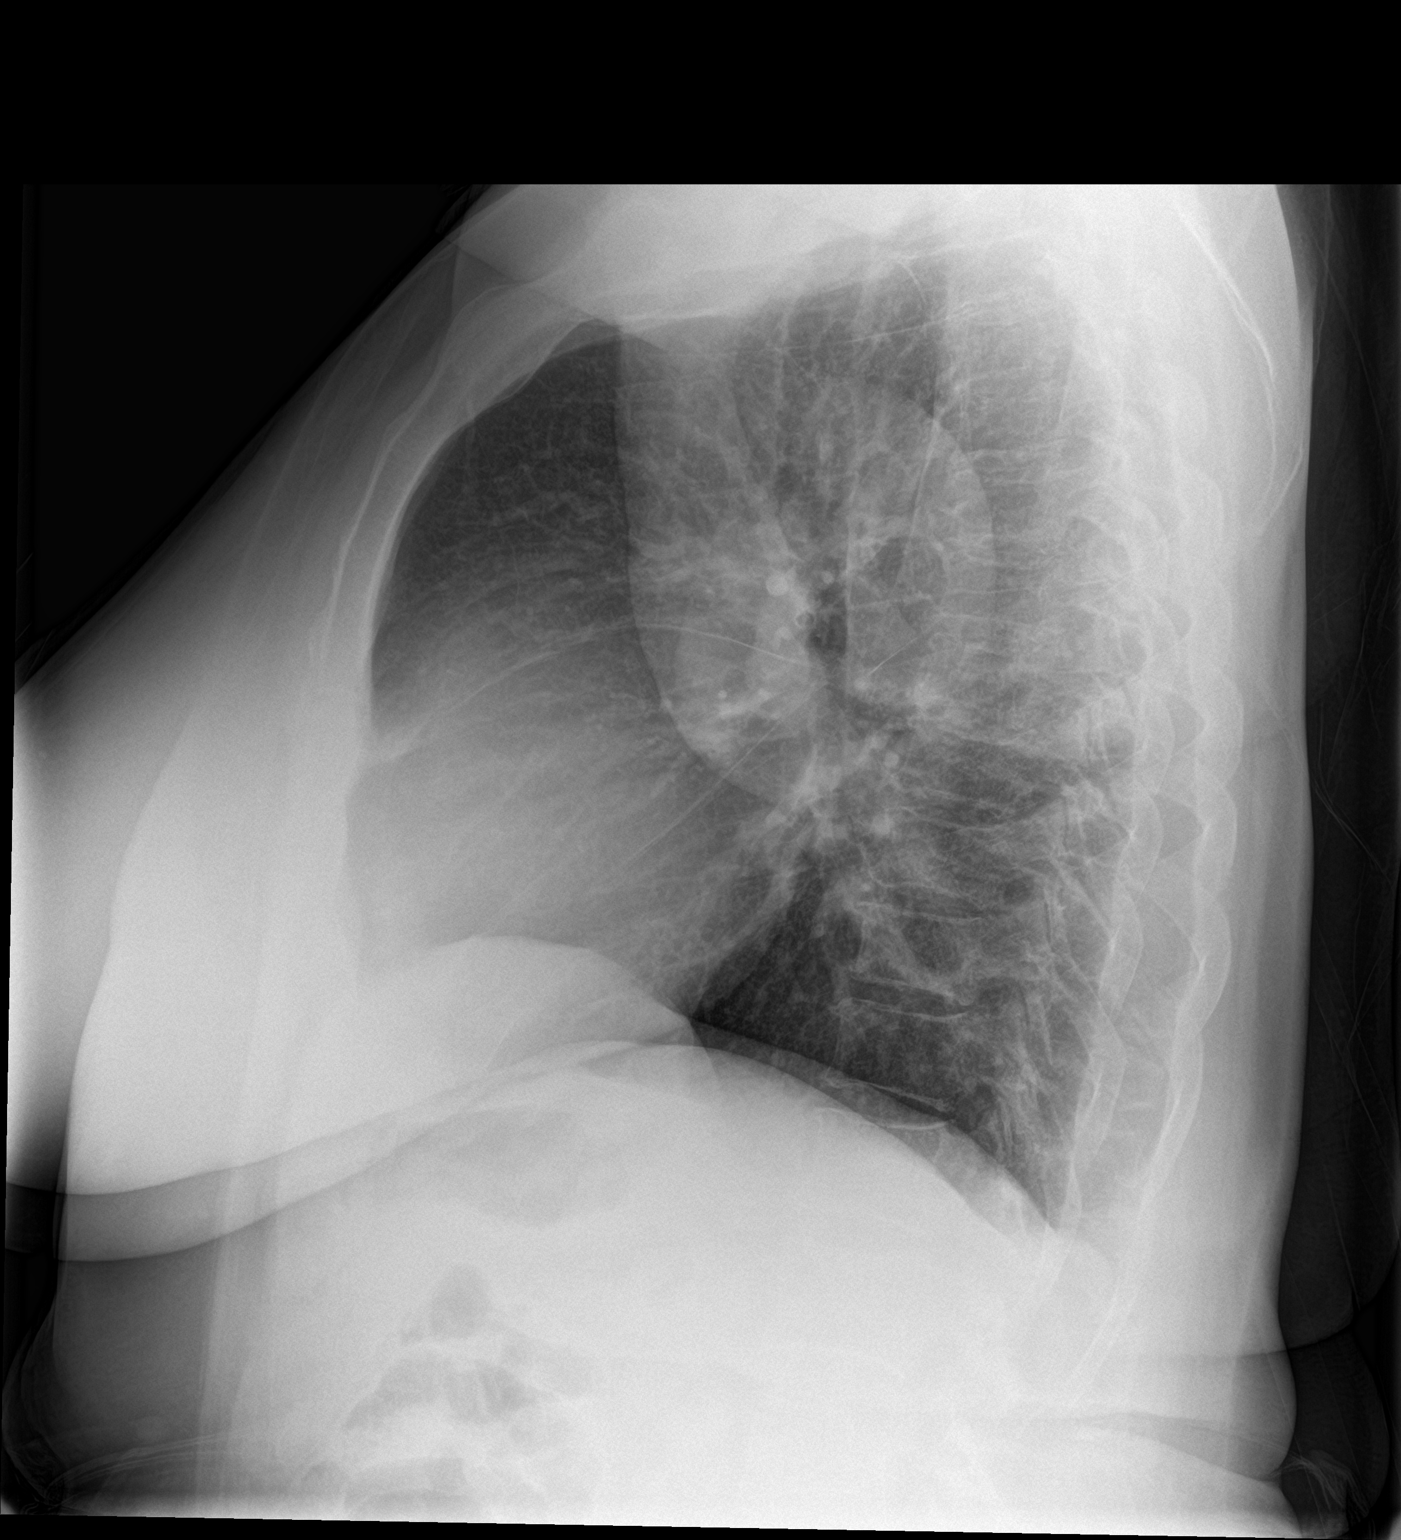

[2 of 2 positions shown; findings below may reference images not displayed]

FINDINGS: Mediastinum and hilar structures normal. Heart size normal. No focal
infiltrate. No pleural effusion or pneumothorax. Degenerative
changes thoracic spine.
IMPRESSION: No acute cardiopulmonary disease.

## 2018-10-06 NOTE — Telephone Encounter (Signed)
Pt called to give bp readings.  10/04/2018       160/87  10/05/2018    1st reading 143/102                        2nd reading 143/105  10/06/2018     125/82

## 2018-10-07 MED ORDER — AMLODIPINE BESYLATE 5 MG PO TABS: 5.0000 mg | ORAL_TABLET | Freq: Every day | ORAL | 3 refills | Status: DC

## 2018-10-07 NOTE — Telephone Encounter (Signed)
Add norvasc 5mg  once a day to the HCTZ for blood pressure Send readings in 2 weeks

## 2018-10-07 NOTE — Telephone Encounter (Signed)
Call placed to patient and patient made aware.   Prescription sent to pharmacy.  

## 2018-12-05 ENCOUNTER — Other Ambulatory Visit: Payer: Self-pay | Admitting: Family Medicine

## 2018-12-05 NOTE — Telephone Encounter (Signed)
Ok to refill??  Last office visit/ refill 09/28/2018.

## 2019-01-17 ENCOUNTER — Encounter: Payer: Self-pay | Admitting: Family Medicine

## 2019-01-17 ENCOUNTER — Ambulatory Visit (INDEPENDENT_AMBULATORY_CARE_PROVIDER_SITE_OTHER): Payer: Medicare Other | Admitting: Family Medicine

## 2019-01-17 ENCOUNTER — Other Ambulatory Visit: Payer: Self-pay

## 2019-01-17 VITALS — BP 140/88 | HR 60 | Temp 97.9°F | Resp 14 | Ht 64.0 in | Wt 176.0 lb

## 2019-01-17 DIAGNOSIS — M810 Age-related osteoporosis without current pathological fracture: Secondary | ICD-10-CM

## 2019-01-17 DIAGNOSIS — J449 Chronic obstructive pulmonary disease, unspecified: Secondary | ICD-10-CM

## 2019-01-17 DIAGNOSIS — Z0000 Encounter for general adult medical examination without abnormal findings: Secondary | ICD-10-CM

## 2019-01-17 DIAGNOSIS — E559 Vitamin D deficiency, unspecified: Secondary | ICD-10-CM

## 2019-01-17 DIAGNOSIS — M19011 Primary osteoarthritis, right shoulder: Secondary | ICD-10-CM

## 2019-01-17 DIAGNOSIS — I10 Essential (primary) hypertension: Secondary | ICD-10-CM

## 2019-01-17 NOTE — Patient Instructions (Signed)
Schedule your Bone Density We will call with lab results F/U 6 months

## 2019-01-17 NOTE — Progress Notes (Signed)
Subjective:   Patient presents for Medicare Annual/Subsequent preventive examination.    Pt here for wellness visit, medications reviewed.     COPD- using Breo , has albuterol on hand   HTN- taking norvasc and HCTZ without diffculty    BP has been good at home, no chest pain, no SOB     OA- Ultram as needed, gabapentin for neuropathy     Right shoulder pain, worse at night, had history ,wants another shot in her shoulder states that it worked for years.  She has pain when she lays on that side she is able to use her arm normally.  She last had injection in 2013 when I reviewed her chart.  She had rotator cuff syndrome at that time.  Osteoporosis/vitamin D def- she is on Fosamax   she due for bone density   taking 2000IU daily     Review Past Medical/Family/Social: Per EMR   Risk Factors  Current exercise habits: Stays active walks some Dietary issues discussed: Yes  Cardiac risk factors: HTN  Depression Screen  (Note: if answer to either of the following is "Yes", a more complete depression screening is indicated)  Over the past two weeks, have you felt down, depressed or hopeless? No Over the past two weeks, have you felt little interest or pleasure in doing things? No Have you lost interest or pleasure in daily life? No Do you often feel hopeless? No Do you cry easily over simple problems? No   Activities of Daily Living  In your present state of health, do you have any difficulty performing the following activities?:  Driving? No  Managing money? No  Feeding yourself? No  Getting from bed to chair? No  Climbing a flight of stairs? No  Preparing food and eating?: No  Bathing or showering? No  Getting dressed: No  Getting to the toilet? No  Using the toilet:No  Moving around from place to place: No  In the past year have you fallen or had a near fall?:No  Are you sexually active? No  Do you have more than one partner? No   Hearing Difficulties: No  Do you often  ask people to speak up or repeat themselves? No  Do you experience ringing or noises in your ears? No Do you have difficulty understanding soft or whispered voices? No  Do you feel that you have a problem with memory? No Do you often misplace items? No  Do you feel safe at home? Yes  Cognitive Testing  Alert? Yes Normal Appearance?Yes  Oriented to person? Yes Place? Yes  Time? Yes  Recall of three objects? Yes  Can perform simple calculations? Yes  Displays appropriate judgment?Yes  Can read the correct time from a watch face?Yes   List the Names of Other Physician/Practitioners you currently use:   Podiatry   Dr. Jorja Loa- Eye doctor   Screening Tests / Date Colonoscopy   Over max age                Zostavax declined Mammogram Over age 67- UTD Influenza Vaccine  Due  Tetanus/tdap unable to afford  Bone Density- done in 2014, due for repeat   ROS: GEN- denies fatigue, fever, weight loss,weakness, recent illness HEENT- denies eye drainage, change in vision, nasal discharge, CVS- denies chest pain, palpitations RESP- denies SOB, cough, wheeze ABD- denies N/V, change in stools, abd pain GU- denies dysuria, hematuria, dribbling, incontinence MSK- + joint pain, muscle aches, injury Neuro- denies headache, dizziness, syncope,  seizure activity  Physical: vitals reviewed  GEN- NAD, alert and oriented x3 HEENT- PERRL, EOMI, non injected sclera, pink conjunctiva, MMM, oropharynx clear Neck- Supple, no thryomegaly CVS- RRR, no murmur RESP-CTAB MSK- Fair ROM Bilat UE, TTP near [facility] RIGHT SHOULDER, neg empty can, biceps in tact  ABD-NABS,soft,NT,ND EXT- No edema Pulses- Radial, DP- 2+  FALL/AUDIT C/Depression screen negative   Assessment:    Annual wellness medicare exam   Plan:    During the course of the visit the patient was educated and counseled about appropriate screening and preventive services including:   Patient declines flu shot and shingles  vaccine.  Osteoporosis she is willing to get another bone density will see if it is worthwhile coming off of her bisphosphonate which she has been on about 5 years.  Continue vitamin D  Known osteoarthritis in her shoulder she prefers to have an injection performed by orthopedics we will refer her back for evaluation and treatment.  COPD she has not had any exacerbation she is using her maintenance inhaler as prescribed  Hypertension blood pressure is controlled  Discussed advanced directives she was given a handout on this to review with her family .  Diet review for nutrition referral? Yes ____ Not Indicated __x__  Patient Instructions (the written plan) was given to the patient.  Medicare Attestation  I have personally reviewed:  The patient's medical and social history  Their use of alcohol, tobacco or illicit drugs  Their current medications and supplements  The patient's functional ability including ADLs,fall risks, home safety risks, cognitive, and hearing and visual impairment  Diet and physical activities  Evidence for depression or mood disorders  The patient's weight, height, BMI, and visual acuity have been recorded in the chart. I have made referrals, counseling, and provided education to the patient based on review of the above and I have provided the patient with a written personalized care plan for preventive services.

## 2019-01-18 LAB — LIPID PANEL
Cholesterol: 176 mg/dL
HDL: 51 mg/dL
LDL Cholesterol (Calc): 103 mg/dL — ABNORMAL HIGH
Non-HDL Cholesterol (Calc): 125 mg/dL
Total CHOL/HDL Ratio: 3.5 (calc)
Triglycerides: 127 mg/dL

## 2019-01-18 LAB — CBC WITH DIFFERENTIAL/PLATELET
Absolute Monocytes: 853 cells/uL (ref 200–950)
Basophils Absolute: 57 cells/uL (ref 0–200)
Basophils Relative: 0.7 %
Eosinophils Absolute: 107 cells/uL (ref 15–500)
Eosinophils Relative: 1.3 %
HCT: 44.1 % (ref 35.0–45.0)
Hemoglobin: 14.3 g/dL (ref 11.7–15.5)
Lymphs Abs: 2468 cells/uL (ref 850–3900)
MCH: 29.7 pg (ref 27.0–33.0)
MCHC: 32.4 g/dL (ref 32.0–36.0)
MCV: 91.5 fL (ref 80.0–100.0)
MPV: 12.4 fL (ref 7.5–12.5)
Monocytes Relative: 10.4 %
Neutro Abs: 4715 cells/uL (ref 1500–7800)
Neutrophils Relative %: 57.5 %
Platelets: 198 10*3/uL (ref 140–400)
RBC: 4.82 10*6/uL (ref 3.80–5.10)
RDW: 12.4 % (ref 11.0–15.0)
Total Lymphocyte: 30.1 %
WBC: 8.2 10*3/uL (ref 3.8–10.8)

## 2019-01-18 LAB — COMPREHENSIVE METABOLIC PANEL WITH GFR
AG Ratio: 2 (calc) (ref 1.0–2.5)
ALT: 9 U/L (ref 6–29)
AST: 15 U/L (ref 10–35)
Albumin: 3.9 g/dL (ref 3.6–5.1)
Alkaline phosphatase (APISO): 75 U/L (ref 37–153)
BUN: 8 mg/dL (ref 7–25)
CO2: 26 mmol/L (ref 20–32)
Calcium: 9.8 mg/dL (ref 8.6–10.4)
Chloride: 107 mmol/L (ref 98–110)
Creat: 0.62 mg/dL (ref 0.60–0.93)
Globulin: 2 g/dL (ref 1.9–3.7)
Glucose, Bld: 74 mg/dL (ref 65–99)
Potassium: 5.1 mmol/L (ref 3.5–5.3)
Sodium: 141 mmol/L (ref 135–146)
Total Bilirubin: 0.5 mg/dL (ref 0.2–1.2)
Total Protein: 5.9 g/dL — ABNORMAL LOW (ref 6.1–8.1)

## 2019-01-18 LAB — VITAMIN D 25 HYDROXY (VIT D DEFICIENCY, FRACTURES): Vit D, 25-Hydroxy: 32 ng/mL (ref 30–100)

## 2019-01-19 ENCOUNTER — Encounter: Payer: Self-pay | Admitting: *Deleted

## 2019-02-08 ENCOUNTER — Encounter: Payer: Self-pay | Admitting: Orthopedic Surgery

## 2019-02-08 ENCOUNTER — Ambulatory Visit: Payer: Medicare Other | Admitting: Orthopedic Surgery

## 2019-02-08 ENCOUNTER — Other Ambulatory Visit: Payer: Self-pay

## 2019-02-08 ENCOUNTER — Ambulatory Visit: Payer: Medicare Other

## 2019-02-08 VITALS — BP 145/68 | HR 66 | Temp 96.4°F | Ht 64.0 in | Wt 177.0 lb

## 2019-02-08 DIAGNOSIS — G8929 Other chronic pain: Secondary | ICD-10-CM

## 2019-02-08 DIAGNOSIS — M25511 Pain in right shoulder: Secondary | ICD-10-CM

## 2019-02-08 DIAGNOSIS — M25512 Pain in left shoulder: Secondary | ICD-10-CM

## 2019-02-08 NOTE — Progress Notes (Signed)
Alexis Davis  02/08/2019  Body mass index is 30.38 kg/m.   HISTORY SECTION :  Chief Complaint  Patient presents with  . Shoulder Pain    Right shoulder pain, referred by Dr. Buelah Manis.   78 year old female who I saw 5 years ago for shoulder pain got an injection did well until about a month ago she started having severe pain in the right shoulder which is worsening is associated with decrease in loss of motion unrelieved by Tylenol   Review of Systems  Neurological: Positive for loss of consciousness.  All other systems reviewed and are negative.    has a past medical history of Arthritis, Compression fracture of L4 lumbar vertebra, COPD (chronic obstructive pulmonary disease) ([place]) (2010), Hepatic cyst, Hypertension, and Osteoporosis.   Past Surgical History:  Procedure Laterality Date  . ABDOMINAL HYSTERECTOMY    . CATARACT EXTRACTION W/PHACO Right 08/23/2014   Procedure: CATARACT EXTRACTION PHACO AND INTRAOCULAR LENS PLACEMENT RIGHT EYE; CDE:  10.80;  Surgeon: Tonny Branch, MD;  Location: [facility];  Service: Ophthalmology;  Laterality: Right;  . CATARACT EXTRACTION W/PHACO Left 10/08/2014   Procedure: CATARACT EXTRACTION PHACO AND INTRAOCULAR LENS PLACEMENT (IOC);  Surgeon: Tonny Branch, MD;  Location: [facility];  Service: Ophthalmology;  Laterality: Left;  CDE 6.60    Body mass index is 30.38 kg/m.   No Known Allergies   Current Outpatient Medications:  .  albuterol (VENTOLIN HFA) 108 (90 Base) MCG/ACT inhaler, Inhale 2 puffs into the lungs every 4 (four) hours as needed for wheezing or shortness of breath., Disp: 18 g, Rfl: 1 .  alendronate (FOSAMAX) 70 MG tablet, Take with a full glass of water on an empty stomach., Disp: 12 tablet, Rfl: 6 .  amLODipine (NORVASC) 5 MG tablet, Take 1 tablet (5 mg total) by mouth daily., Disp: 90 tablet, Rfl: 3 .  Calcium Carbonate-Vitamin D (CALTRATE 600+D PO), Take 1 tablet by mouth 2 (two) times daily., Disp: , Rfl:  .   clotrimazole-betamethasone (LOTRISONE) cream, Apply 1 application topically 2 (two) times daily., Disp: 45 g, Rfl: 2 .  COD LIVER OIL PO, Take by mouth. 4 tablets daily, Disp: , Rfl:  .  Cyanocobalamin (VITAMIN B 12) 100 MCG LOZG, Take one PO QD, Disp: , Rfl:  .  fluticasone (FLONASE) 50 MCG/ACT nasal spray, Place 2 sprays into both nostrils daily., Disp: 16 g, Rfl: 6 .  fluticasone furoate-vilanterol (BREO ELLIPTA) 100-25 MCG/INH AEPB, Inhale 1 puff into the lungs daily., Disp: 30 each, Rfl: 6 .  gabapentin (NEURONTIN) 300 MG capsule, Take 1 capsule (300 mg total) by mouth 3 (three) times daily., Disp: 270 capsule, Rfl: 3 .  hydrochlorothiazide (HYDRODIURIL) 25 MG tablet, Take 1 tablet (25 mg total) by mouth daily., Disp: 90 tablet, Rfl: 2 .  ipratropium (ATROVENT) 0.03 % nasal spray, Place 2 sprays into both nostrils 2 (two) times daily as needed for rhinitis., Disp: 30 mL, Rfl: 2 .  loratadine (CLARITIN) 10 MG tablet, Take 1 tablet (10 mg total) by mouth daily., Disp: 30 tablet, Rfl: 6 .  traMADol (ULTRAM) 50 MG tablet, TAKE (1) TABLET BY MOUTH EVERY EIGHT HOURS AS NEEDED FOR PAIN., Disp: 30 tablet, Rfl: 0   PHYSICAL EXAM SECTION: 1) BP (!) 145/68   Pulse 66   Temp (!) 96.4 F (35.8 C)   Ht 5\' 4"  (1.626 m)   Wt 177 lb (80.3 kg)   BMI 30.38 kg/m   Body mass index is 30.38 kg/m. General appearance: Well-developed well-nourished  no gross deformities  2) Cardiovascular normal pulse and perfusion in the both upper extremities normal color without edema  3) Neurologically deep tendon reflexes are equal and normal, no sensation loss or deficits no pathologic reflexes  4) Psychological: Awake alert and oriented x3 mood and affect normal  5) Skin no lacerations or ulcerations no nodularity no palpable masses, no erythema or nodularity  6) Musculoskeletal:   Left shoulder range of motion is normal strength is excellent muscle tone is excellent skin is normal no tenderness  Right shoulder  is tender in the periacromial region abduction limited to 90 degrees flexion limited to about 60 degrees external rotation remains about 45 degrees positive impingement sign is noted rotator cuff strength in the adductor position internal and external rotation normal   MEDICAL DECISION SECTION:  Encounter Diagnosis  Name Primary?  . Chronic right shoulder pain Yes    Imaging X-ray glenohumeral arthritis   Plan:  (Rx., Inj., surg., Frx, MRI/CT, XR:2)  You have received an injection of steroids into the joint. 15% of patients will have increased pain within the 24 hours postinjection.   This is transient and will go away.   We recommend that you use ice packs on the injection site for 20 minutes every 2 hours and extra strength Tylenol 2 tablets every 8 as needed until the pain resolves.  If you continue to have pain after taking the Tylenol and using the ice please call the office for further instructions.  Home exercise program   Follow-up as needed 10:03 AM Arther Abbott, MD  02/08/2019

## 2019-02-08 NOTE — Patient Instructions (Addendum)
You have received an injection of steroids into the joint. 15% of patients will have increased pain within the 24 hours postinjection.   This is transient and will go away.   We recommend that you use ice packs on the injection site for 20 minutes every 2 hours and extra strength Tylenol 2 tablets every 8 as needed until the pain resolves.  If you continue to have pain after taking the Tylenol and using the ice please call the office for further instructions.    Shoulder Range of Motion Exercises Shoulder range of motion (ROM) exercises are done to keep the shoulder moving freely or to increase movement. They are often recommended for people who have shoulder pain or stiffness or who are recovering from a shoulder surgery. Phase 1 exercises When you are able, do this exercise 1-2 times per day for 30-60 seconds in each direction, or as directed by your health care provider. Pendulum exercise To do this exercise while sitting: 1. Sit in a chair or at the edge of your bed with your feet flat on the floor. 2. Let your affected arm hang down in front of you over the edge of the bed or chair. 3. Relax your shoulder, arm, and hand. Ajo your body so your arm gently swings in small circles. You can also use your unaffected arm to start the motion. 5. Repeat changing the direction of the circles, swinging your arm left and right, and swinging your arm forward and back. To do this exercise while standing: 1. Stand next to a sturdy chair or table, and hold on to it with your hand on your unaffected side. 2. Bend forward at the waist. 3. Bend your knees slightly. 4. Relax your shoulder, arm, and hand. 5. While keeping your shoulder relaxed, use body motion to swing your arm in small circles. 6. Repeat changing the direction of the circles, swinging your arm left and right, and swinging your arm forward and back. 7. Between exercises, stand up tall and take a short break to relax your lower  back.  Phase 2 exercises Do these exercises 1-2 times per day or as told by your health care provider. Hold each stretch for 30 seconds, and repeat 3 times. Do the exercises with one or both arms as instructed by your health care provider. For these exercises, sit at a table with your hand and arm supported by the table. A chair that slides easily or has wheels can be helpful. External rotation 1. Turn your chair so that your affected side is nearest to the table. 2. Place your forearm on the table to your side. Bend your elbow about 90 at the elbow (right angle) and place your hand palm facing down on the table. Your elbow should be about 6 inches away from your side. 3. Keeping your arm on the table, lean your body forward. Abduction 1. Turn your chair so that your affected side is nearest to the table. 2. Place your forearm and hand on the table so that your thumb points toward the ceiling and your arm is straight out to your side. 3. Slide your hand out to the side and away from you, using your unaffected arm to do the work. 4. To increase the stretch, you can slide your chair away from the table. Flexion: forward stretch 1. Sit facing the table. Place your hand and elbow on the table in front of you. 2. Slide your hand forward and away from you, using  your unaffected arm to do the work. 3. To increase the stretch, you can slide your chair backward. Phase 3 exercises Do these exercises 1-2 times per day or as told by your health care provider. Hold each stretch for 30 seconds, and repeat 3 times. Do the exercises with one or both arms as instructed by your health care provider. Cross-body stretch: posterior capsule stretch 1. Lift your arm straight out in front of you. 2. Bend your arm 90 at the elbow (right angle) so your forearm moves across your body. 3. Use your other arm to gently pull the elbow across your body, toward your other shoulder. Wall climbs 1. Stand with your affected  arm extended out to the side with your hand resting on a door frame. 2. Slide your hand slowly up the door frame. 3. To increase the stretch, step through the door frame. Keep your body upright and do not lean. Wand exercises You will need a cane, a piece of PVC pipe, or a sturdy wooden dowel for wand exercises. Flexion To do this exercise while standing: 1. Hold the wand with both of your hands, palms down. 2. Using the other arm to help, lift your arms up and over your head, if able. 3. Push upward with your other arm to gently increase the stretch. To do this exercise while lying down: 1. Lie on your back with your elbows resting on the floor and the wand in both your hands. Your hands will be palm down, or pointing toward your feet. 2. Lift your hands toward the ceiling, using your unaffected arm to help if needed. 3. Bring your arms overhead as able, using your unaffected arm to help if needed. Internal rotation 1. Stand while holding the wand behind you with both hands. Your unaffected arm should be extended above your head with the arm of the affected side extended behind you at the level of your waist. The wand should be pointing straight up and down as you hold it. 2. Slowly pull the wand up behind your back by straightening the elbow of your unaffected arm and bending the elbow of your affected arm. External rotation 1. Lie on your back with your affected upper arm supported on a small pillow or rolled towel. When you first do this exercise, keep your upper arm close to your body. Over time, bring your arm up to a 90 angle out to the side. 2. Hold the wand across your stomach and with both hands palm up. Your elbow on your affected side should be bent at a 90 angle. 3. Use your unaffected side to help push your forearm away from you and toward the floor. Keep your elbow on your affected side bent at a 90 angle. Contact a health care provider if you have:  New or increasing  pain.  New numbness, tingling, weakness, or discoloration in your arm or hand. This information is not intended to replace advice given to you by your health care provider. Make sure you discuss any questions you have with your health care provider. Document Released: 11/15/2002 Document Revised: 03/31/2017 Document Reviewed: 03/31/2017 Elsevier Patient Education  [place].   These are the muscle and arthrits creams I recommend:  PLEASE READ THE PACKAGE INSTRUCTIONS BEFORE USING   Ben Gay arthritis cream  Icy hot vanishing gel  Aspercreme odor free  Myoflex Oderless pain reliever  Capzasin  Sportscreme  Max freeze

## 2019-07-03 ENCOUNTER — Telehealth: Payer: Self-pay | Admitting: Family Medicine

## 2019-07-03 NOTE — Chronic Care Management (AMB) (Signed)
  Chronic Care Management   Note  07/03/2019 Name: DEEPTI HANDCOCK MRN: [id number] DOB: 06/26/1940  KRYSTALEE SHIBUYA is a 79 y.o. year old female who is a primary care patient of [place], Modena Nunnery, MD. I reached out to [name] by phone today in response to a referral sent by Ms. Gracelyn Nurse Terra's PCP, Buelah Manis, Modena Nunnery, MD.   Ms. Dott was given information about Chronic Care Management services today including:  1. CCM service includes personalized support from designated clinical staff supervised by her physician, including individualized plan of care and coordination with other care providers 2. 24/7 contact phone numbers for assistance for urgent and routine care needs. 3. Service will only be billed when office clinical staff spend 20 minutes or more in a month to coordinate care. 4. Only one practitioner may furnish and bill the service in a calendar month. 5. The patient may stop CCM services at any time (effective at the end of the month) by phone call to the office staff.   Patient agreed to services and verbal consent obtained.   Follow up plan:   [place]

## 2019-07-04 ENCOUNTER — Telehealth: Payer: Self-pay | Admitting: Family Medicine

## 2019-07-04 NOTE — Chronic Care Management (AMB) (Signed)
°  Chronic Care Management   Note  07/04/2019 Name: Alexis Davis MRN: [id number] DOB: February 02, 1941  Alexis Davis is a 79 y.o. year old female who is a primary care patient of [place], Modena Nunnery, MD. I reached out to [name] by phone today in response to a referral sent by [name]'s PCP, Buelah Manis, Modena Nunnery, MD.   [name] was given information about Chronic Care Management services today including:  1. CCM service includes personalized support from designated clinical staff supervised by her physician, including individualized plan of care and coordination with other care providers 2. 24/7 contact phone numbers for assistance for urgent and routine care needs. 3. Service will only be billed when office clinical staff spend 20 minutes or more in a month to coordinate care. 4. Only one practitioner may furnish and bill the service in a calendar month. 5. The patient may stop CCM services at any time (effective at the end of the month) by phone call to the office staff.   Patient agreed to services and verbal consent obtained.   Follow up plan:   [place]

## 2019-07-11 ENCOUNTER — Ambulatory Visit: Payer: Medicare Other

## 2019-07-14 ENCOUNTER — Other Ambulatory Visit: Payer: Self-pay | Admitting: Family Medicine

## 2019-07-14 DIAGNOSIS — G629 Polyneuropathy, unspecified: Secondary | ICD-10-CM

## 2019-07-14 DIAGNOSIS — M810 Age-related osteoporosis without current pathological fracture: Secondary | ICD-10-CM

## 2019-07-14 DIAGNOSIS — J449 Chronic obstructive pulmonary disease, unspecified: Secondary | ICD-10-CM

## 2019-07-14 DIAGNOSIS — I10 Essential (primary) hypertension: Secondary | ICD-10-CM

## 2019-07-17 NOTE — Chronic Care Management (AMB) (Signed)
Chronic Care Management Pharmacy  Name: Alexis Davis  MRN: [id number] DOB: Dec 27, 1940  Chief Complaint/ HPI  Alexis Davis,  79 y.o. , female presents for their Initial CCM visit with the clinical pharmacist In office.  PCP : Alexis Rossetti, MD  Their chronic conditions include: hypertension, COPD, osteoarthritis, neuropathy.  Office Visits:  01/17/2019 [facility]) - scheduled bone density scan, no other changes noted  Consult Visit:  02/08/2019 [facility]) - given injection of steroids into shoulder joint  Medications: Outpatient Encounter Medications as of 07/18/2019  Medication Sig  . albuterol (VENTOLIN HFA) 108 (90 Base) MCG/ACT inhaler Inhale 2 puffs into the lungs every 4 (four) hours as needed for wheezing or shortness of breath.  Marland Kitchen alendronate (FOSAMAX) 70 MG tablet Take with a full glass of water on an empty stomach.  Marland Kitchen amLODipine (NORVASC) 5 MG tablet Take 1 tablet (5 mg total) by mouth daily.  . Calcium Carbonate-Vitamin D (CALTRATE 600+D PO) Take 1 tablet by mouth 2 (two) times daily.  . clotrimazole-betamethasone (LOTRISONE) cream Apply 1 application topically 2 (two) times daily.  . COD LIVER OIL PO Take by mouth. 4 tablets daily  . Cyanocobalamin (VITAMIN B 12) 100 MCG LOZG Take one PO QD  . fluticasone (FLONASE) 50 MCG/ACT nasal spray Place 2 sprays into both nostrils daily.  . fluticasone furoate-vilanterol (BREO ELLIPTA) 100-25 MCG/INH AEPB Inhale 1 puff into the lungs daily.  Marland Kitchen gabapentin (NEURONTIN) 300 MG capsule Take 1 capsule (300 mg total) by mouth 3 (three) times daily.  . hydrochlorothiazide (HYDRODIURIL) 25 MG tablet Take 1 tablet (25 mg total) by mouth daily.  Marland Kitchen ipratropium (ATROVENT) 0.03 % nasal spray Place 2 sprays into both nostrils 2 (two) times daily as needed for rhinitis.  Marland Kitchen loratadine (CLARITIN) 10 MG tablet Take 1 tablet (10 mg total) by mouth daily.  . traMADol (ULTRAM) 50 MG tablet TAKE (1) TABLET BY MOUTH EVERY EIGHT  HOURS AS NEEDED FOR PAIN.   No facility-administered encounter medications on file as of 07/18/2019.     Current Diagnosis/Assessment:    Emergency planning/management officer Strain: Low Risk   . Difficulty of Paying Living Expenses: Not very hard     Goals Addressed            This Visit's Progress   . Care Plan:       CARE PLAN ENTRY  Current Barriers:  . Chronic Disease Management support, education, and care coordination needs related to Hypertension, COPD, and osteoarthritis.   Hypertension . Pharmacist Clinical Goal(s): o Over the next 180 days, patient will work with PharmD and providers to maintain BP goal <140/90 . Current regimen:  o Amlodipine 5mg  daily, HCTZ 25 mg daily . Interventions: o Initiate monitoring plan o Comprehensive medication review . Patient self care activities - Over the next 180 days, patient will: o Purchase new BP monitor  o Check BP once daily, document, and provide at future appointments o Ensure daily salt intake < 2300 mg/day  COPD . Pharmacist Clinical Goal(s) o Over the next 180 days, patient will work with PharmD and providers to optimize medication related to COPD . Current regimen:  o Albuterol 84mcg inhaler prn o Breo Ellipta 100-75mcg . Interventions: o Comprehensive medication review o Continue current therapy . Patient self care activities - Over the next 180 days, patient will: o Continue to take medications as prescribed o Contact PharmD or PCP with any increase in SOB.  Osteoarthritis . Pharmacist Clinical Goal(s) o Over the  next 180 days, patient will work with PharmD and providers to optimize medication related to pain/osteoarthritis. . Current regimen:  o Tramadol 50mg  every 8 hours as needed . Interventions: o Comprehensive medication review o Continue current therapy . Patient self care activities - Over the next 180 days, patient will: o Continue to take medication as prescribed o Contact PharmD or PCP with any increase  in pain symptoms or medication adverse effects    Initial goal documentation        COPD   Eosinophil count:   Lab Results  Component Value Date/Time   EOSPCT 1.3 01/17/2019 09:57 AM  %                               Eos (Absolute):  Lab Results  Component Value Date/Time   EOSABS 107 01/17/2019 09:57 AM     Patient has failed these meds in past: advair Patient is currently controlled on the following medications: albuterol HFA 60mcg, Breo 100-25 once daily Using maintenance inhaler regularly? No Frequency of rescue inhaler use:  prn  Using rescue inhaler properly, reports no SOB or chest pain.  [facility] is affordable patient has not concerns with access.  Plan  Continue current medications ,  Hypertension    Office blood pressures are  BP Readings from Last 3 Encounters:  02/08/19 (!) 145/68  01/17/19 140/88  09/28/18 (!) 154/82    Patient has failed these meds in the past: none noted   Patient checks BP at home infrequently  Patient home BP readings are ranging: N/A  Patient is currently controlled on the following medications: amlodipine 5mg  daily, HCTZ 25mg  daily  She has a monitor at home but it does not work properly.  She has not monitored BP at home in over a month.  Encouraged her to pick up a new monitor and begin to monitor her BP at least once daily.  No swelling with amlodipine.  Plan  Continue current medications      Osteoarthritis    Patient has failed these meds in past: none noted Patient is currently controlled on the following medications: tramadol 50mg  prn, steroid injections prn in shoulder  Pain currently controlled, last injection in November.  Patient not having to use tramadol that much and no current concerns with shoulder pain.  Plan  Continue current medications   Vaccines   Reviewed and discussed patient's vaccination history.    Immunization History  Administered Date(s) Administered  . Influenza,inj,Quad PF,6+  Mos 12/09/2012  . Pneumococcal Conjugate-13 06/09/2013  . Pneumococcal Polysaccharide-23 04/23/2005    Plan  Recommended patient receive shingles, Tdap vaccine in office/pharmacy.   Medication Management   . Miscellaneous medications: gabapentin 300mg  tid, tramadol 50mg  prn . OTC's: loratadine 10mg  daily, vitamin b12, calcium/vit D . Patient currently uses [facility].  Phone #  [phone] . Patient reports using pill box method to organize medications and promote adherence. . Patient denies missed doses of medication.   Beverly Milch, PharmD Clinical Pharmacist [facility] [phone]

## 2019-07-18 ENCOUNTER — Ambulatory Visit: Payer: Medicare Other | Admitting: Pharmacist

## 2019-07-18 ENCOUNTER — Telehealth: Payer: Self-pay | Admitting: *Deleted

## 2019-07-18 ENCOUNTER — Other Ambulatory Visit: Payer: Self-pay

## 2019-07-18 DIAGNOSIS — M159 Polyosteoarthritis, unspecified: Secondary | ICD-10-CM

## 2019-07-18 DIAGNOSIS — J449 Chronic obstructive pulmonary disease, unspecified: Secondary | ICD-10-CM

## 2019-07-18 DIAGNOSIS — I10 Essential (primary) hypertension: Secondary | ICD-10-CM

## 2019-07-18 DIAGNOSIS — M8949 Other hypertrophic osteoarthropathy, multiple sites: Secondary | ICD-10-CM

## 2019-07-18 MED ORDER — CLOTRIMAZOLE-BETAMETHASONE 1-0.05 % EX CREA: 1.0000 "application " | TOPICAL_CREAM | Freq: Two times a day (BID) | CUTANEOUS | 2 refills | Status: DC

## 2019-07-18 NOTE — Telephone Encounter (Signed)
Prescription sent to pharmacy.

## 2019-07-18 NOTE — Telephone Encounter (Signed)
-----   Message from Edythe Clarity, [facility] sent at 07/18/2019  9:54 AM EDT ----- Alexis Davis,  [name] said she is out of her Lotrisone cream and would like a refill sent into her pharmacy.  We cannot send in Rx's yet unfortunately. Thanks!

## 2019-07-18 NOTE — Patient Instructions (Addendum)
Thank you for meeting with me today!  I look forward to working with you to help you meet all of your healthcare goals and answer any questions you may have.  Feel free to contact me anytime!   Visit Information   Goals Addressed            This Visit's Progress   . Care Plan:       CARE PLAN ENTRY  Current Barriers:  . Chronic Disease Management support, education, and care coordination needs related to Hypertension, COPD, and osteoarthritis.   Hypertension . Pharmacist Clinical Goal(s): o Over the next 180 days, patient will work with PharmD and providers to maintain BP goal <140/90 . Current regimen:  o Amlodipine 5mg  daily, HCTZ 25 mg daily . Interventions: o Initiate monitoring plan o Comprehensive medication review . Patient self care activities - Over the next 180 days, patient will: o Purchase new BP monitor  o Check BP once daily, document, and provide at future appointments o Ensure daily salt intake < 2300 mg/day  COPD . Pharmacist Clinical Goal(s) o Over the next 180 days, patient will work with PharmD and providers to optimize medication related to COPD . Current regimen:  o Albuterol 53mcg inhaler prn o Breo Ellipta 100-14mcg . Interventions: o Comprehensive medication review o Continue current therapy . Patient self care activities - Over the next 180 days, patient will: o Continue to take medications as prescribed o Contact PharmD or PCP with any increase in SOB.  Osteoarthritis . Pharmacist Clinical Goal(s) o Over the next 180 days, patient will work with PharmD and providers to optimize medication related to pain/osteoarthritis. . Current regimen:  o Tramadol 50mg  every 8 hours as needed . Interventions: o Comprehensive medication review o Continue current therapy . Patient self care activities - Over the next 180 days, patient will: o Continue to take medication as prescribed o Contact PharmD or PCP with any increase in pain symptoms or  medication adverse effects    Initial goal documentation        Alexis Davis was given information about Chronic Care Management services today including:  1. CCM service includes personalized support from designated clinical staff supervised by her physician, including individualized plan of care and coordination with other care providers 2. 24/7 contact phone numbers for assistance for urgent and routine care needs. 3. Standard insurance, coinsurance, copays and deductibles apply for chronic care management only during months in which we provide at least 20 minutes of these services. Most insurances cover these services at 100%, however patients may be responsible for any copay, coinsurance and/or deductible if applicable. This service may help you avoid the need for more expensive face-to-face services. 4. Only one practitioner may furnish and bill the service in a calendar month. 5. The patient may stop CCM services at any time (effective at the end of the month) by phone call to the office staff.  Patient agreed to services and verbal consent obtained.   The patient verbalized understanding of instructions provided today and agreed to receive a mailed copy of patient instruction and/or educational materials. Telephone follow up appointment with pharmacy team member scheduled for:  01/18/2020 @ 9:00 AM . Alexis Davis, PharmD Clinical Pharmacist [facility] [phone]   Preventing Hypertension Hypertension, commonly called high blood pressure, is when the force of blood pumping through the arteries is too strong. Arteries are blood vessels that carry blood from the heart throughout the body. Over time, hypertension can  damage the arteries and decrease blood flow to important parts of the body, including the brain, heart, and kidneys. Often, hypertension does not cause symptoms until blood pressure is very high. For this reason, it is important to have your blood  pressure checked on a regular basis. Hypertension can often be prevented with diet and lifestyle changes. If you already have hypertension, you can control it with diet and lifestyle changes, as well as medicine. What nutrition changes can be made? Maintain a healthy diet. This includes:  Eating less salt (sodium). Ask your health care provider how much sodium is safe for you to have. The general recommendation is to consume less than 1 tsp (2,300 mg) of sodium a day. ? Do not add salt to your food. ? Choose low-sodium options when grocery shopping and eating out.  Limiting fats in your diet. You can do this by eating low-fat or fat-free dairy products and by eating less red meat.  Eating more fruits, vegetables, and whole grains. Make a goal to eat: ? 1-2 cups of fresh fruits and vegetables each day. ? 3-4 servings of whole grains each day.  Avoiding foods and beverages that have added sugars.  Eating fish that contain healthy fats (omega-3 fatty acids), such as mackerel or salmon. If you need help putting together a healthy eating plan, try the DASH diet. This diet is high in fruits, vegetables, and whole grains. It is low in sodium, red meat, and added sugars. DASH stands for Dietary Approaches to Stop Hypertension. What lifestyle changes can be made?   Lose weight if you are overweight. Losing just 3?5% of your body weight can help prevent or control hypertension. ? For example, if your present weight is 200 lb (91 kg), a loss of 3-5% of your weight means losing 6-10 lb (2.7-4.5 kg). ? Ask your health care provider to help you with a diet and exercise plan to safely lose weight.  Get enough exercise. Do at least 150 minutes of moderate-intensity exercise each week. ? You could do this in short exercise sessions several times a day, or you could do longer exercise sessions a few times a week. For example, you could take a brisk 10-minute walk or bike ride, 3 times a day, for 5 days a  week.  Find ways to reduce stress, such as exercising, meditating, listening to music, or taking a yoga class. If you need help reducing stress, ask your health care provider.  Do not smoke. This includes e-cigarettes. Chemicals in tobacco and nicotine products raise your blood pressure each time you smoke. If you need help quitting, ask your health care provider.  Avoid alcohol. If you drink alcohol, limit alcohol intake to no more than 1 drink a day for nonpregnant women and 2 drinks a day for men. One drink equals 12 oz of beer, 5 oz of wine, or 1 oz of hard liquor. Why are these changes important? Diet and lifestyle changes can help you prevent hypertension, and they may make you feel better overall and improve your quality of life. If you have hypertension, making these changes will help you control it and help prevent major complications, such as:  Hardening and narrowing of arteries that supply blood to: ? Your heart. This can cause a heart attack. ? Your brain. This can cause a stroke. ? Your kidneys. This can cause kidney failure.  Stress on your heart muscle, which can cause heart failure. What can I do to lower my risk?  Work with your health care provider to make a hypertension prevention plan that works for you. Follow your plan and keep all follow-up visits as told by your health care provider.  Learn how to check your blood pressure at home. Make sure that you know your personal target blood pressure, as told by your health care provider. How is this treated? In addition to diet and lifestyle changes, your health care provider may recommend medicines to help lower your blood pressure. You may need to try a few different medicines to find what works best for you. You also may need to take more than one medicine. Take over-the-counter and prescription medicines only as told by your health care provider. Where to find support Your health care provider can help you prevent  hypertension and help you keep your blood pressure at a healthy level. Your local hospital or your community may also provide support services and prevention programs. The American Heart Association offers an online support network at: CheapBootlegs.com.cy Where to find more information Learn more about hypertension from:  [employer], [employer], and Blood Institute: ElectronicHangman.is  Centers for Disease Control and Prevention: https://ingram.com/  American Academy of Family Physicians: http://familydoctor.org/familydoctor/en/diseases-conditions/high-blood-pressure.printerview.all.html Learn more about the DASH diet from:  [place] [facility], [facility], and [place]: https://www.reyes.com/ Contact a health care provider if:  You think you are having a reaction to medicines you have taken.  You have recurrent headaches or feel dizzy.  You have swelling in your ankles.  You have trouble with your vision. Summary  Hypertension often does not cause any symptoms until blood pressure is very high. It is important to get your blood pressure checked regularly.  Diet and lifestyle changes are the most important steps in preventing hypertension.  By keeping your blood pressure in a healthy range, you can prevent complications like heart attack, heart failure, stroke, and kidney failure.  Work with your health care provider to make a hypertension prevention plan that works for you. This information is not intended to replace advice given to you by your health care provider. Make sure you discuss any questions you have with your health care provider. Document Revised: 06/10/2018 Document Reviewed: 10/28/2015 Elsevier Patient Education  2020 [employer].

## 2019-07-19 ENCOUNTER — Encounter: Payer: Self-pay | Admitting: Family Medicine

## 2019-07-19 ENCOUNTER — Ambulatory Visit (INDEPENDENT_AMBULATORY_CARE_PROVIDER_SITE_OTHER): Payer: Medicare Other | Admitting: Family Medicine

## 2019-07-19 VITALS — BP 128/68 | HR 66 | Temp 98.0°F | Resp 14 | Ht 64.0 in | Wt 184.0 lb

## 2019-07-19 DIAGNOSIS — M8949 Other hypertrophic osteoarthropathy, multiple sites: Secondary | ICD-10-CM | POA: Diagnosis not present

## 2019-07-19 DIAGNOSIS — M159 Polyosteoarthritis, unspecified: Secondary | ICD-10-CM

## 2019-07-19 DIAGNOSIS — J449 Chronic obstructive pulmonary disease, unspecified: Secondary | ICD-10-CM | POA: Diagnosis not present

## 2019-07-19 DIAGNOSIS — R252 Cramp and spasm: Secondary | ICD-10-CM | POA: Diagnosis not present

## 2019-07-19 DIAGNOSIS — I10 Essential (primary) hypertension: Secondary | ICD-10-CM

## 2019-07-19 DIAGNOSIS — J302 Other seasonal allergic rhinitis: Secondary | ICD-10-CM | POA: Insufficient documentation

## 2019-07-19 DIAGNOSIS — R6889 Other general symptoms and signs: Secondary | ICD-10-CM | POA: Diagnosis not present

## 2019-07-19 DIAGNOSIS — G629 Polyneuropathy, unspecified: Secondary | ICD-10-CM

## 2019-07-19 MED ORDER — LORATADINE 10 MG PO TABS: 10.0000 mg | ORAL_TABLET | Freq: Every day | ORAL | 6 refills | Status: DC

## 2019-07-19 MED ORDER — GABAPENTIN 300 MG PO CAPS: 300.0000 mg | ORAL_CAPSULE | Freq: Three times a day (TID) | ORAL | 3 refills | Status: DC

## 2019-07-19 MED ORDER — TRAMADOL HCL 50 MG PO TABS: ORAL_TABLET | ORAL | 0 refills | Status: DC

## 2019-07-19 NOTE — Assessment & Plan Note (Signed)
Controlled no changes to meds Check renal function 

## 2019-07-19 NOTE — Assessment & Plan Note (Addendum)
No recent exacerbations, continue Breo for prevention  She has some allergy symptoms sneezing, congestion in throat , refilled claritin

## 2019-07-19 NOTE — Assessment & Plan Note (Signed)
Continue gabapentin Thigh pain sounds like spasm, she describes as knots or drawing sensation Check mag, b12, K No red flags one exam

## 2019-07-19 NOTE — Progress Notes (Signed)
   Subjective:    Patient ID: Alexis Davis, female    DOB: Jun 07, 1940, 79 y.o.   MRN: [id number]  Patient presents for Pain (x1 month- B upper thigh pain that worsens at night- states it feels like knots in legs)  She has bilat thigh pain, feels like knots or spasms only in the front of legs mostly at night This has been going on for a month now., no swelling in legs. She thinks it started after missing a few days of gabapentin  Denies any knee pain, lower leg pain  She is out of ultram  HTN- taking BP medicine as prescribed   COPD- no exacernation - she is taking Breo daily,   has had COVID-19 vaccines  She does get sneezing, drainage in throat, cough in morning, but dry, tried mucinex recently  no wheezing or SOB, has not used her allergy meds, worse when outside   Review Of Systems:  GEN- denies fatigue, fever, weight loss,weakness, recent illness HEENT- denies eye drainage, change in vision,+ nasal discharge, CVS- denies chest pain, palpitations RESP- denies SOB, +cough, wheeze ABD- denies N/V, change in stools, abd pain GU- denies dysuria, hematuria, dribbling, incontinence MSK- denies joint pain,+ muscle aches, injury Neuro- denies headache, dizziness, syncope, seizure activity       Objective:    BP 128/68   Pulse 66   Temp 98 F (36.7 C) (Temporal)   Resp 14   Ht 5\' 4"  (1.626 m)   Wt 184 lb (83.5 kg)   SpO2 97%   BMI 31.58 kg/m  GEN- NAD, alert and oriented x3 HEENT- PERRL, EOMI, non injected sclera, pink conjunctiva, MMM, oropharynx clear, nares clear, no sinus tenderness, TM clear no effusion  CVS- RRR, no murmur RESP-CTAB MSK- Spine NT, Fair ROM spine, hips, thighs NT, neg SLR, fair ROM bilat knees, no effusion  EXT- No edema Pulses- Radial, DP- 2+        Assessment & Plan:      Problem List Items Addressed This Visit      Unprioritized   COPD, mild (HCC)    No recent exacerbations, continue Breo for prevention  She has some allergy  symptoms sneezing, congestion in throat , refilled claritin       Relevant Medications   loratadine (CLARITIN) 10 MG tablet   ESSENTIAL HYPERTENSION, BENIGN - Primary    Controlled no changes to meds Check renal function       Relevant Orders   CBC with Differential/Platelet   Basic metabolic panel   OA (osteoarthritis)    Ultram refilled       Relevant Medications   traMADol (ULTRAM) 50 MG tablet   Peripheral polyneuropathy    Continue gabapentin Thigh pain sounds like spasm, she describes as knots or drawing sensation Check mag, b12, K No red flags one exam       Relevant Medications   gabapentin (NEURONTIN) 300 MG capsule   Other Relevant Orders   Vitamin B12   Seasonal allergies    Other Visit Diagnoses    Leg cramps       Relevant Orders   Magnesium   Vitamin B12      Note: This dictation was prepared with Dragon dictation along with smaller phrase technology. Any transcriptional errors that result from this process are unintentional.

## 2019-07-19 NOTE — Patient Instructions (Signed)
F/U 6 months for physical Take the tramadol for pain Take claritin for allergies Continue gabapentin We will call with results

## 2019-07-19 NOTE — Assessment & Plan Note (Signed)
Ultram refilled.

## 2019-07-20 LAB — CBC WITH DIFFERENTIAL/PLATELET
Absolute Monocytes: 941 cells/uL (ref 200–950)
Basophils Absolute: 87 cells/uL (ref 0–200)
Basophils Relative: 0.9 %
Eosinophils Absolute: 87 cells/uL (ref 15–500)
Eosinophils Relative: 0.9 %
HCT: 42.8 % (ref 35.0–45.0)
Hemoglobin: 14.3 g/dL (ref 11.7–15.5)
Lymphs Abs: 3114 cells/uL (ref 850–3900)
MCH: 31 pg (ref 27.0–33.0)
MCHC: 33.4 g/dL (ref 32.0–36.0)
MCV: 92.6 fL (ref 80.0–100.0)
MPV: 12.4 fL (ref 7.5–12.5)
Monocytes Relative: 9.7 %
Neutro Abs: 5471 cells/uL (ref 1500–7800)
Neutrophils Relative %: 56.4 %
Platelets: 203 10*3/uL (ref 140–400)
RBC: 4.62 10*6/uL (ref 3.80–5.10)
RDW: 12.8 % (ref 11.0–15.0)
Total Lymphocyte: 32.1 %
WBC: 9.7 10*3/uL (ref 3.8–10.8)

## 2019-07-20 LAB — BASIC METABOLIC PANEL
BUN: 12 mg/dL (ref 7–25)
CO2: 26 mmol/L (ref 20–32)
Calcium: 9.7 mg/dL (ref 8.6–10.4)
Chloride: 107 mmol/L (ref 98–110)
Creat: 0.6 mg/dL (ref 0.60–0.88)
Glucose, Bld: 87 mg/dL (ref 65–99)
Potassium: 5.1 mmol/L (ref 3.5–5.3)
Sodium: 142 mmol/L (ref 135–146)

## 2019-07-20 LAB — VITAMIN B12: Vitamin B-12: 449 pg/mL (ref 200–1100)

## 2019-07-20 LAB — MAGNESIUM: Magnesium: 2 mg/dL (ref 1.5–2.5)

## 2019-07-27 ENCOUNTER — Other Ambulatory Visit: Payer: Self-pay | Admitting: *Deleted

## 2019-07-27 MED ORDER — MAGNESIUM 250 MG PO TABS: 1.0000 | ORAL_TABLET | Freq: Every day | ORAL | 0 refills | Status: DC

## 2019-10-31 ENCOUNTER — Other Ambulatory Visit: Payer: Self-pay

## 2019-10-31 ENCOUNTER — Encounter: Payer: Self-pay | Admitting: Family Medicine

## 2019-10-31 ENCOUNTER — Ambulatory Visit (INDEPENDENT_AMBULATORY_CARE_PROVIDER_SITE_OTHER): Payer: Medicare Other | Admitting: Family Medicine

## 2019-10-31 VITALS — BP 130/74 | HR 68 | Temp 98.3°F | Resp 14 | Ht 64.0 in | Wt 179.0 lb

## 2019-10-31 DIAGNOSIS — M25562 Pain in left knee: Secondary | ICD-10-CM | POA: Diagnosis not present

## 2019-10-31 DIAGNOSIS — M1712 Unilateral primary osteoarthritis, left knee: Secondary | ICD-10-CM | POA: Diagnosis not present

## 2019-10-31 DIAGNOSIS — I10 Essential (primary) hypertension: Secondary | ICD-10-CM

## 2019-10-31 DIAGNOSIS — G8929 Other chronic pain: Secondary | ICD-10-CM

## 2019-10-31 MED ORDER — ALENDRONATE SODIUM 70 MG PO TABS: ORAL_TABLET | ORAL | 6 refills | Status: DC

## 2019-10-31 MED ORDER — FLUTICASONE PROPIONATE 50 MCG/ACT NA SUSP: 2.0000 | Freq: Every day | NASAL | 6 refills | Status: DC

## 2019-10-31 MED ORDER — TRAMADOL HCL 50 MG PO TABS
ORAL_TABLET | ORAL | 1 refills | Status: DC
Start: 2019-10-31 — End: 2020-02-21

## 2019-10-31 MED ORDER — AMLODIPINE BESYLATE 5 MG PO TABS: 5.0000 mg | ORAL_TABLET | Freq: Every day | ORAL | 3 refills | Status: DC

## 2019-10-31 MED ORDER — HYDROCHLOROTHIAZIDE 25 MG PO TABS: 25.0000 mg | ORAL_TABLET | Freq: Every day | ORAL | 2 refills | Status: DC

## 2019-10-31 NOTE — Progress Notes (Signed)
   Subjective:    Patient ID: Alexis Davis, female    DOB: 14-Oct-1940, 79 y.o.   MRN: [id number]  Patient presents for Knee Pain (x weeks- L knee pain- no known injury- feels like knee jumps out of place)  Patient here with worsening left knee pain.  She does have known osteoarthritis.  Her knee feels like it is giving out on her.  She feels like her kneecap slides back and forth as well.  She is not had any swelling.  She does get popping of the knee.  She started to use a knee brace on Sunday because it felt unstable.  She does take tramadol but is out of this medication.  She requests referral to Dr. Lorin Mercy who also sees her husband.    Review Of Systems:  GEN- denies fatigue, fever, weight loss,weakness, recent illness HEENT- denies eye drainage, change in vision, nasal discharge, CVS- denies chest pain, palpitations RESP- denies SOB, cough, wheeze ABD- denies N/V, change in stools, abd pain GU- denies dysuria, hematuria, dribbling, incontinence MSK-+ joint pain, muscle aches, injury Neuro- denies headache, dizziness, syncope, seizure activity       Objective:    BP 130/74   Pulse 68   Temp 98.3 F (36.8 C) (Temporal)   Resp 14   Ht 5\' 4"  (1.626 m)   Wt 179 lb (81.2 kg)   SpO2 98%   BMI 30.73 kg/m  GEN- NAD, alert and oriented x3 CVS- RRR, no murmur RESP-CTAB MSK- Bilat knee no effusion, left knee +crepitus, patella centered, NT, no erythema, fair ROM bilat knee, TTP inferior left knee  EXT- No edema Pulses- Radial, 2+        Assessment & Plan:      Problem List Items Addressed This Visit      Unprioritized   ESSENTIAL HYPERTENSION, BENIGN    meds refilled at pt request BP has been at goal       Relevant Medications   amLODipine (NORVASC) 5 MG tablet   hydrochlorothiazide (HYDRODIURIL) 25 MG tablet   OA (osteoarthritis)   Relevant Medications   traMADol (ULTRAM) 50 MG tablet   Other Relevant Orders   Ambulatory referral to Orthopedic Surgery     Other Visit Diagnoses    Chronic pain of left knee    -  Primary   OA knee, referral to ortho, wear knee brace to stabilize, ultram refilled, tylenol can also be used   Relevant Medications   traMADol (ULTRAM) 50 MG tablet   Other Relevant Orders   Ambulatory referral to Orthopedic Surgery      Note: This dictation was prepared with Dragon dictation along with smaller phrase technology. Any transcriptional errors that result from this process are unintentional.

## 2019-10-31 NOTE — Assessment & Plan Note (Signed)
meds refilled at pt request BP has been at goal

## 2019-10-31 NOTE — Patient Instructions (Signed)
Take tramadol and tylenol Use the knee brace Referral to Dr. Lorin Mercy F/U as previous

## 2019-11-27 ENCOUNTER — Encounter: Payer: Self-pay | Admitting: Orthopedic Surgery

## 2019-11-27 ENCOUNTER — Ambulatory Visit: Payer: Medicare Other

## 2019-11-27 ENCOUNTER — Other Ambulatory Visit: Payer: Self-pay

## 2019-11-27 ENCOUNTER — Ambulatory Visit: Payer: Medicare Other | Admitting: Orthopedic Surgery

## 2019-11-27 VITALS — BP 154/90 | HR 77 | Ht 64.0 in | Wt 172.0 lb

## 2019-11-27 DIAGNOSIS — M25562 Pain in left knee: Secondary | ICD-10-CM

## 2019-11-27 DIAGNOSIS — G8929 Other chronic pain: Secondary | ICD-10-CM | POA: Diagnosis not present

## 2019-11-27 DIAGNOSIS — M1712 Unilateral primary osteoarthritis, left knee: Secondary | ICD-10-CM | POA: Diagnosis not present

## 2019-11-27 NOTE — Progress Notes (Signed)
Patient ID: Alexis Davis, female   DOB: 1940-12-14, 79 y.o.   MRN: [id number]  Chief Complaint  Patient presents with  . Knee Pain    left since July picking string beans feels like kneecap slips out of place   Encounter Diagnosis  Name Primary?  . Chronic pain of left knee Yes    Alexis Davis was seen today for knee pain.  Diagnoses and all orders for this visit:  Chronic pain of left knee -     DG Knee AP/LAT W/[place] Left; Future  Alexis Davis has 3 compartment arthrosis on x-ray, has some medial joint line pain which may indicate meniscal tear  Start with Aleve/Advil for pain wear economy hinged brace inject knee come back in 4 months to see if any further imaging is needed  Procedure note left knee injection   verbal consent was obtained to inject left knee joint  Timeout was completed to confirm the site of injection  The medications used were 40 mg of Depo-Medrol and 1% lidocaine 3 cc  Anesthesia was provided by ethyl chloride and the skin was prepped with alcohol.  After cleaning the skin with alcohol a 20-gauge needle was used to inject the left knee joint. There were no complications. A sterile bandage was applied.    This is an 79 year old female with no history of trauma presents with medial knee pain for approximately 2 months complains of a catching sensation and feels like the knee is going to give out this is associated with some swelling and primarily medial pain.  No prior treatment other than an over-the-counter knee sleeve   Review of Systems  Constitutional: Negative for fever.  Respiratory: Negative for shortness of breath.   Cardiovascular: Negative for chest pain.  Skin: Negative.   Neurological: Negative for tingling and sensory change.    Past Medical History:  Diagnosis Date  . Arthritis    Shoulder  . Compression fracture of L4 lumbar vertebra    Pt did not have pain, osteoporosis  . COPD (chronic obstructive pulmonary disease) ([place])  2010   PFT  . Hepatic cyst   . Hypertension   . Osteoporosis     Past Surgical History:  Procedure Laterality Date  . ABDOMINAL HYSTERECTOMY    . CATARACT EXTRACTION W/PHACO Right 08/23/2014   Procedure: CATARACT EXTRACTION PHACO AND INTRAOCULAR LENS PLACEMENT RIGHT EYE; CDE:  10.80;  Surgeon: Tonny Branch, MD;  Location: [facility];  Service: Ophthalmology;  Laterality: Right;  . CATARACT EXTRACTION W/PHACO Left 10/08/2014   Procedure: CATARACT EXTRACTION PHACO AND INTRAOCULAR LENS PLACEMENT (IOC);  Surgeon: Tonny Branch, MD;  Location: [facility];  Service: Ophthalmology;  Laterality: Left;  CDE 6.60    PHYSICAL EXAM  BP (!) 154/90   Pulse 77   Ht 5\' 4"  (1.626 m)   Wt 172 lb (78 kg)   BMI 29.52 kg/m  GENERAL appearance reveals no gross abnormalities, normal development grooming and hygiene   MENTAL STATUS we note that the patient is awake alert and oriented to person place and time MOOD/AFFECT ARE NORMAL   GAIT reveals no limp in the effected limb, stride length and cadence are short and slow   Right Knee Exam   Other  Erythema: absent Scars: absent Sensation: normal Pulse: present Swelling: none Effusion: no effusion present   Left Knee Exam   Muscle Strength  The patient has normal left knee strength.  Tenderness  The patient is experiencing tenderness in the medial joint line.  Range of  Motion  Extension: normal  Flexion: normal   Tests  [place]ray:  Medial - negative  Drawer:  Anterior - negative     Posterior - negative  Other  Erythema: absent Scars: absent Sensation: normal Pulse: present Swelling: none Effusion: no effusion present     VASC 2+ dorsalis pedis pulse normal capillary refill excellent warmth to the extremity  NEURO normal sensation and no pathologic reflexes  LYMPH deferred noncontributory  MEDICAL DECISION MAKING  A.  Encounter Diagnosis  Name Primary?  . Chronic pain of left knee Yes    B. DATA  ANALYSED:    IMAGING: Independent interpretation of images: 3 sided OA with reasonable alignment  Orders: None  Outside records reviewed: no  C. MANAGEMENT see above   No orders of the defined types were placed in this encounter.

## 2019-11-27 NOTE — Patient Instructions (Signed)
You have received an injection of steroids into the joint. 15% of patients will have increased pain within the 24 hours postinjection.   This is transient and will go away.   We recommend that you use ice packs on the injection site for 20 minutes every 2 hours and extra strength Tylenol 2 tablets every 8 as needed until the pain resolves.  If you continue to have pain after taking the Tylenol and using the ice please call the office for further instructions.  Wear brace   Take advil or aleve for pain

## 2019-12-21 ENCOUNTER — Telehealth: Payer: Self-pay | Admitting: Pharmacist

## 2019-12-21 NOTE — Progress Notes (Signed)
Chronic Care Management Pharmacy Assistant   Name: Alexis Davis  MRN: [id number] DOB: 1940-09-25  Reason for Encounter: Disease State  Patient Questions:  1.  Have you seen any other providers since your last visit? Yes  2.  Any changes in your medicines or health? Yes    PCP : Alycia Rossetti, MD   Their chronic conditions include: hypertension, COPD, osteoarthritis, neuropathy.  Office Visit: 10-31-2019 (PCP) : Patient presented in the office c/o worsening left knee pain. She reported feeling like her kneecap slides back and forth and it is going to give out on her. Referral to Dr. Lorin Mercy was made.  07-19-2019 (PCP): Patient presented in the office c/o bilateral thigh pain. She reports feeling like knots or spasms in the front of legs mostly at night. She believes this started after missing a few days of her Gabapentin. Was advised to take Tramadol for pain, prescribed Loratadine 10 mg for allergies.  Consults: 11-27-19 (Ortho) Patient presented in the office for left knee injection. Was advised to ice site for 20 mins every two hours and take extra strength Tylenol every eight hours PRN for pain. Also advised to wear her knee brace. No medication changes made  Allergies:  No Known Allergies  Medications: Outpatient Encounter Medications as of 12/21/2019  Medication Sig  . albuterol (VENTOLIN HFA) 108 (90 Base) MCG/ACT inhaler Inhale 2 puffs into the lungs every 4 (four) hours as needed for wheezing or shortness of breath.  Marland Kitchen alendronate (FOSAMAX) 70 MG tablet Take with a full glass of water on an empty stomach.  Marland Kitchen amLODipine (NORVASC) 5 MG tablet Take 1 tablet (5 mg total) by mouth daily.  . Calcium Carbonate-Vitamin D (CALTRATE 600+D PO) Take 1 tablet by mouth 2 (two) times daily.  . clotrimazole-betamethasone (LOTRISONE) cream Apply 1 application topically 2 (two) times daily.  . COD LIVER OIL PO Take by mouth. 4 tablets daily  . Cyanocobalamin (VITAMIN B 12) 100 MCG  LOZG Take one PO QD  . fluticasone (FLONASE) 50 MCG/ACT nasal spray Place 2 sprays into both nostrils daily.  . fluticasone furoate-vilanterol (BREO ELLIPTA) 100-25 MCG/INH AEPB Inhale 1 puff into the lungs daily.  Marland Kitchen gabapentin (NEURONTIN) 300 MG capsule Take 1 capsule (300 mg total) by mouth 3 (three) times daily.  . hydrochlorothiazide (HYDRODIURIL) 25 MG tablet Take 1 tablet (25 mg total) by mouth daily.  Marland Kitchen ipratropium (ATROVENT) 0.03 % nasal spray Place 2 sprays into both nostrils 2 (two) times daily as needed for rhinitis.  Marland Kitchen loratadine (CLARITIN) 10 MG tablet Take 1 tablet (10 mg total) by mouth daily. For allergies  . Magnesium 250 MG TABS Take 1 tablet (250 mg total) by mouth daily.  . traMADol (ULTRAM) 50 MG tablet TAKE (1) TABLET BY MOUTH EVERY EIGHT HOURS AS NEEDED FOR PAIN.   No facility-administered encounter medications on file as of 12/21/2019.    Current Diagnosis: Patient Active Problem List   Diagnosis Date Noted  . Seasonal allergies 07/19/2019  . Peripheral polyneuropathy 11/24/2017  . Vitamin D deficiency 06/22/2016  . Varicose veins 03/16/2014  . Tinea pedis 11/03/2013  . OA (osteoarthritis) 11/03/2013  . Osteoporosis 12/09/2012  . Itching 12/09/2012  . PTTD (posterior tibial tendon dysfunction) 09/01/2012  . Neuropathy, leg 04/29/2012  . Rotator cuff syndrome of right shoulder 11/26/2011  . COPD, mild ([place]) 10/22/2008  . ESSENTIAL HYPERTENSION, BENIGN 09/25/2008  . Osteoarthritis of shoulder 06/12/2008  . HEPATIC CYST 03/16/2006  . DEGENERATION, DISC NOS 03/16/2006  Goals Addressed   None     Reviewed chart prior to disease state call. Spoke with patient regarding BP  Recent Office Vitals: BP Readings from Last 3 Encounters:  11/27/19 (!) 154/90  10/31/19 130/74  07/19/19 128/68   Pulse Readings from Last 3 Encounters:  11/27/19 77  10/31/19 68  07/19/19 66    Wt Readings from Last 3 Encounters:  11/27/19 172 lb (78 kg)  10/31/19 179 lb  (81.2 kg)  07/19/19 184 lb (83.5 kg)     Kidney Function Lab Results  Component Value Date/Time   CREATININE 0.60 07/19/2019 08:35 AM   CREATININE 0.62 01/17/2019 09:57 AM   GFRNONAA 87 11/24/2017 11:33 AM   GFRAA 101 11/24/2017 11:33 AM    BMP Latest Ref Rng & Units 07/19/2019 01/17/2019 09/28/2018  Glucose 65 - 99 mg/dL 87 74 96  BUN 7 - 25 mg/dL 12 8 11   Creatinine 0.60 - 0.88 mg/dL 0.60 0.62 0.68  BUN/Creat Ratio 6 - 22 (calc) NOT APPLICABLE NOT APPLICABLE NOT APPLICABLE  Sodium 027 - 146 mmol/L 142 141 142  Potassium 3.5 - 5.3 mmol/L 5.1 5.1 5.1  Chloride 98 - 110 mmol/L 107 107 108  CO2 20 - 32 mmol/L 26 26 28   Calcium 8.6 - 10.4 mg/dL 9.7 9.8 10.4    . Current antihypertensive regimen:  o Amlodipine 5mg  daily o  HCTZ 25 mg daily  . How often are you checking your Blood Pressure? 1-2x per week   . Current home BP readings: Patient did not have any readings written down to provide to me. Advised that she start to write down her blood pressure for future appointments.  . What recent interventions/DTPs have been made by any provider to improve Blood Pressure control since last CPP Visit:  .  Marland Kitchen Any recent hospitalizations or ED visits since last visit with CPP? No   . What diet changes have been made to improve Blood Pressure Control?  o Patient states she usually eats oatmeal, nothing too fatening.  . What exercise is being done to improve your Blood Pressure Control?  - Patient states she walks daily.  I did inquire how patient's breathing has been in reference to her COPD during disease state call. Patient stated she does not have any breathing issues and uses her Albuterol inhaler very infrequently.  Adherence Review: Is the patient currently on ACE/ARB medication? No Does the patient have >5 day gap between last estimated fill dates? No CPP please confirm   Follow-Up:  Pharmacist Review   Fanny Skates, [place] Pharmacist Assistant [phone]

## 2020-01-16 ENCOUNTER — Encounter: Payer: Medicare Other | Admitting: Family Medicine

## 2020-01-18 ENCOUNTER — Ambulatory Visit: Payer: Self-pay | Admitting: Pharmacist

## 2020-01-18 NOTE — Patient Instructions (Addendum)
Visit Information  Goals Addressed            This Visit's Progress   . Pharmacy Care Plan:       CARE PLAN ENTRY  Current Barriers:  . Chronic Disease Management support, education, and care coordination needs related to Hypertension, COPD, and osteoarthritis.   Hypertension . Pharmacist Clinical Goal(s): o Over the next 180 days, patient will work with PharmD and providers to maintain BP goal <140/90 . Current regimen:  o Amlodipine 5mg  daily, HCTZ 25 mg daily . Interventions: o Initiate monitoring plan o Comprehensive medication review . Patient self care activities - Over the next 180 days, patient will: o Purchase new BP monitor  o Check BP once daily, document, and provide at future appointments o Ensure daily salt intake < 2300 mg/day  COPD . Pharmacist Clinical Goal(s) o Over the next 180 days, patient will work with PharmD and providers to optimize medication related to COPD . Current regimen:  o Albuterol 38mcg inhaler prn o Breo Ellipta 100-72mcg . Interventions: o Comprehensive medication review o Continue current therapy . Patient self care activities - Over the next 180 days, patient will: o Continue to take medications as prescribed o Contact PharmD or PCP with any increase in SOB.  Osteoarthritis . Pharmacist Clinical Goal(s) o Over the next 180 days, patient will work with PharmD and providers to optimize medication related to pain/osteoarthritis. . Current regimen:  o Tramadol 50mg  every 8 hours as needed . Interventions: o Comprehensive medication review o Continue current therapy . Patient self care activities - Over the next 180 days, patient will: o Continue to take medication as prescribed o Contact PharmD or PCP with any increase in pain symptoms or medication adverse effects   Osteoporosis . Pharmacist Clinical Goal(s) o Over the next 180 days, patient will work with PharmD and providers to optimize meds and minimize symptoms of  Osteoporosis. . Current regimen:  o Alendronate 70mg  weekly . Interventions: o Recommended repeat bone density scan o Counseled on appropriate use of med . Patient self care activities - Over the next 180 days, patient will: o Continue to take medication as directed o Schedule updated bone density scan at [facility]   Please see past updates related to this goal by clicking on the "Past Updates" button in the selected goal         The patient verbalized understanding of instructions, educational materials, and care plan provided today and agreed to receive a mailed copy of patient instructions, educational materials, and care plan.   Telephone follow up appointment with pharmacy team member scheduled for: 36 months  Quinnie Barcelo, PharmD Clinical Pharmacist [facility] [phone]   COPD and Physical Activity Chronic obstructive pulmonary disease (COPD) is a long-term (chronic) condition that affects the lungs. COPD is a general term that can be used to describe many different lung problems that cause lung swelling (inflammation) and limit airflow, including chronic bronchitis and emphysema. The main symptom of COPD is shortness of breath, which makes it harder to do even simple tasks. This can also make it harder to exercise and be active. Talk with your health care provider about treatments to help you breathe better and actions you can take to prevent breathing problems during physical activity. What are the benefits of exercising with COPD? Exercising regularly is an important part of a healthy lifestyle. You can still exercise and do physical activities even though you have COPD. Exercise and physical activity  improve your shortness of breath by increasing blood flow (circulation). This causes your heart to pump more oxygen through your body. Moderate exercise can improve your:  Oxygen use.  Energy level.  Shortness of breath.  Strength in your  breathing muscles.  Heart health.  Sleep.  Self-esteem and feelings of self-worth.  Depression, stress, and anxiety levels. Exercise can benefit everyone with COPD. The severity of your disease may affect how hard you can exercise, especially at first, but everyone can benefit. Talk with your health care provider about how much exercise is safe for you, and which activities and exercises are safe for you. What actions can I take to prevent breathing problems during physical activity?  Sign up for a pulmonary rehabilitation program. This type of program may include: ? Education about lung diseases. ? Exercise classes that teach you how to exercise and be more active while improving your breathing. This usually involves:  Exercise using your lower extremities, such as a stationary bicycle.  About 30 minutes of exercise, 2 to 5 times per week, for 6 to 12 weeks  Strength training, such as push ups or leg lifts. ? Nutrition education. ? Group classes in which you can talk with others who also have COPD and learn ways to manage stress.  If you use an oxygen tank, you should use it while you exercise. Work with your health care provider to adjust your oxygen for your physical activity. Your resting flow rate is different from your flow rate during physical activity.  While you are exercising: ? Take slow breaths. ? Pace yourself and do not try to go too fast. ? Purse your lips while breathing out. Pursing your lips is similar to a kissing or whistling position. ? If doing exercise that uses a quick burst of effort, such as weight lifting:  Breathe in before starting the exercise.  Breathe out during the hardest part of the exercise (such as raising the weights). Where to find support You can find support for exercising with COPD from:  Your health care provider.  A pulmonary rehabilitation program.  Your local health department or community health programs.  Support groups,  online or in-person. Your health care provider may be able to recommend support groups. Where to find more information You can find more information about exercising with COPD from:  American Lung Association: ClassInsider.se.  COPD Foundation: https://www.rivera.net/. Contact a health care provider if:  Your symptoms get worse.  You have chest pain.  You have nausea.  You have a fever.  You have trouble talking or catching your breath.  You want to start a new exercise program or a new activity. Summary  COPD is a general term that can be used to describe many different lung problems that cause lung swelling (inflammation) and limit airflow. This includes chronic bronchitis and emphysema.  Exercise and physical activity improve your shortness of breath by increasing blood flow (circulation). This causes your heart to provide more oxygen to your body.  Contact your health care provider before starting any exercise program or new activity. Ask your health care provider what exercises and activities are safe for you. This information is not intended to replace advice given to you by your health care provider. Make sure you discuss any questions you have with your health care provider. Document Revised: 06/08/2018 Document Reviewed: 03/11/2017 Elsevier Patient Education  2020 [employer].

## 2020-01-18 NOTE — Chronic Care Management (AMB) (Signed)
Chronic Care Management Pharmacy  Name: MARGEAN KORELL  MRN: [id number] DOB: 1940/10/26  Chief Complaint/ HPI  Alexis Davis,  79 y.o. , female presents for their Follow-Up CCM visit with the clinical pharmacist In office.  PCP : Alycia Rossetti, MD  Their chronic conditions include: hypertension, COPD, osteoarthritis, neuropathy.  Office Visits: 10/31/2019 [facility]) - worsening L knee pain knee feels like it is giving out on her, requests referral to ortho which was given  07/19/2019 [facility]) - Upper thigh pain that worsens at night that feels like knots or spasms, she reports it started after missing gabapentin for a few days.  Gabapentin continued, tramadol for pain  01/17/2019 [facility]) - scheduled bone density scan, no other changes noted  Consult Visit: 11/27/2019 [facility]) - she was given an injection into L knee, she has arthrosis in joint, regular follow up planned  02/08/2019 [facility]) - given injection of steroids into shoulder joint  Medications: Outpatient Encounter Medications as of 01/18/2020  Medication Sig  . albuterol (VENTOLIN HFA) 108 (90 Base) MCG/ACT inhaler Inhale 2 puffs into the lungs every 4 (four) hours as needed for wheezing or shortness of breath.  Marland Kitchen alendronate (FOSAMAX) 70 MG tablet Take with a full glass of water on an empty stomach.  Marland Kitchen amLODipine (NORVASC) 5 MG tablet Take 1 tablet (5 mg total) by mouth daily.  . Calcium Carbonate-Vitamin D (CALTRATE 600+D PO) Take 1 tablet by mouth 2 (two) times daily.  . clotrimazole-betamethasone (LOTRISONE) cream Apply 1 application topically 2 (two) times daily.  . COD LIVER OIL PO Take by mouth. 4 tablets daily  . Cyanocobalamin (VITAMIN B 12) 100 MCG LOZG Take one PO QD  . fluticasone (FLONASE) 50 MCG/ACT nasal spray Place 2 sprays into both nostrils daily.  . fluticasone furoate-vilanterol (BREO ELLIPTA) 100-25 MCG/INH AEPB Inhale 1 puff into the lungs daily.  Marland Kitchen gabapentin  (NEURONTIN) 300 MG capsule Take 1 capsule (300 mg total) by mouth 3 (three) times daily.  . hydrochlorothiazide (HYDRODIURIL) 25 MG tablet Take 1 tablet (25 mg total) by mouth daily.  Marland Kitchen ipratropium (ATROVENT) 0.03 % nasal spray Place 2 sprays into both nostrils 2 (two) times daily as needed for rhinitis.  Marland Kitchen loratadine (CLARITIN) 10 MG tablet Take 1 tablet (10 mg total) by mouth daily. For allergies  . Magnesium 250 MG TABS Take 1 tablet (250 mg total) by mouth daily.  . traMADol (ULTRAM) 50 MG tablet TAKE (1) TABLET BY MOUTH EVERY EIGHT HOURS AS NEEDED FOR PAIN.   No facility-administered encounter medications on file as of 01/18/2020.     Current Diagnosis/Assessment:   Goals Addressed            This Visit's Progress   . Pharmacy Care Plan:       CARE PLAN ENTRY  Current Barriers:  . Chronic Disease Management support, education, and care coordination needs related to Hypertension, COPD, and osteoarthritis.   Hypertension . Pharmacist Clinical Goal(s): o Over the next 180 days, patient will work with PharmD and providers to maintain BP goal <140/90 . Current regimen:  o Amlodipine 5mg  daily, HCTZ 25 mg daily . Interventions: o Initiate monitoring plan o Comprehensive medication review . Patient self care activities - Over the next 180 days, patient will: o Purchase new BP monitor  o Check BP once daily, document, and provide at future appointments o Ensure daily salt intake < 2300 mg/day  COPD . Pharmacist Clinical Goal(s) o Over the next 180 days,  patient will work with PharmD and providers to optimize medication related to COPD . Current regimen:  o Albuterol 62mcg inhaler prn o Breo Ellipta 100-74mcg . Interventions: o Comprehensive medication review o Continue current therapy . Patient self care activities - Over the next 180 days, patient will: o Continue to take medications as prescribed o Contact PharmD or PCP with any increase in  SOB.  Osteoarthritis . Pharmacist Clinical Goal(s) o Over the next 180 days, patient will work with PharmD and providers to optimize medication related to pain/osteoarthritis. . Current regimen:  o Tramadol 50mg  every 8 hours as needed . Interventions: o Comprehensive medication review o Continue current therapy . Patient self care activities - Over the next 180 days, patient will: o Continue to take medication as prescribed o Contact PharmD or PCP with any increase in pain symptoms or medication adverse effects   Osteoporosis . Pharmacist Clinical Goal(s) o Over the next 180 days, patient will work with PharmD and providers to optimize meds and minimize symptoms of Osteoporosis. . Current regimen:  o Alendronate 70mg  weekly . Interventions: o Recommended repeat bone density scan o Counseled on appropriate use of med . Patient self care activities - Over the next 180 days, patient will: o Continue to take medication as directed o Schedule updated bone density scan at [facility]   Please see past updates related to this goal by clicking on the "Past Updates" button in the selected goal         COPD   Eosinophil count:   Lab Results  Component Value Date/Time   EOSPCT 0.9 07/19/2019 08:35 AM  %                               Eos (Absolute):  Lab Results  Component Value Date/Time   EOSABS 87 07/19/2019 08:35 AM     Patient has failed these meds in past: advair Patient is currently controlled on the following medications:   Albuterol HFA 26mcg  Breo 100-25 once daily Using maintenance inhaler regularly? Yes Frequency of rescue inhaler use:  prn  We discussed:  She reports using her maintenance inhaler daily now, breathing controlled  She is using rescue inhaler as needed  She states she feels really good, inhalers are affordable, no concerns  Plan  Continue current medications  Hypertension    Office blood pressures are  BP Readings from Last 3  Encounters:  11/27/19 (!) 154/90  10/31/19 130/74  07/19/19 128/68    Patient has failed these meds in the past: none noted   Patient checks BP at home several times per month  Patient home BP readings are ranging: N/A   Patient is currently controlled on the following medications:   Amlodipine 5mg  daily  HCTZ 25mg  daily  We discussed:  No medication changes   BP controlled at home when she checks it, recommended she check it more often and record so that we have a record of it  Denies swelling, dizziness, headache  Plan  Continue current medications      Osteoarthritis    Patient has failed these meds in past: none noted Patient is currently controlled on the following medications:   Tramadol 50mg  prn   We discussed:  Pain controlled with recent steroid injection at ortho office  She reports feeling the best she has felt in a while, wake up every morning feeling great  Plan  Continue current medications  Osteopenia / Osteoporosis   Last DEXA Scan: 2014   Vit D, 25-Hydroxy  Date Value Ref Range Status  01/17/2019 32 30 - 100 ng/mL Final    Comment:    Vitamin D Status         25-OH Vitamin D: . Deficiency:                    <20 ng/mL Insufficiency:             20 - 29 ng/mL Optimal:                 > or = 30 ng/mL . For 25-OH Vitamin D testing on patients on  D2-supplementation and patients for whom quantitation  of D2 and D3 fractions is required, the [facility](TM) 25-OH VIT D, (D2,D3), LC/MS/MS is recommended: order  code [phone] (patients >73yrs). See Note 1 . Note 1 . For additional information, please refer to  http://education.[facility].com/faq/FAQ199  (This link is being provided for informational/ educational purposes only.)       Patient has failed these meds in past: Patient is currently controlled on the following medications:  . Alendronate 70mg  weekly  We discussed:    Counseled on oral bisphosphonate  administration: take in the morning, 30 minutes prior to food with 6-8 oz of water. Do not lie down for at least 30 minutes after taking.  Bone density scan order placed by PCP - reminded patient to call and schedule her scan  She reports no concerns with med at this time  Plan  Continue current medications  Vaccines   Reviewed and discussed patient's vaccination history.    Immunization History  Administered Date(s) Administered  . Influenza,inj,Quad PF,6+ Mos 12/09/2012  . Moderna SARS-COVID-2 Vaccination 04/28/2019, 05/26/2019  . Pneumococcal Conjugate-13 06/09/2013  . Pneumococcal Polysaccharide-23 04/23/2005    Plan  Recommended patient receive shingles, Tdap vaccine in office/pharmacy.  Patient plans to get COVID-19 booster this week. Medication Management   . Miscellaneous medications: gabapentin 300mg  tid . OTC's: loratadine 10mg  daily, vitamin b12, calcium/vit D . Patient currently uses [facility].  Phone #  [phone] . Patient reports using pill box method to organize medications and promote adherence. . Patient denies missed doses of medication.   Beverly Milch, PharmD Clinical Pharmacist [place] [phone]

## 2020-02-21 ENCOUNTER — Encounter: Payer: Self-pay | Admitting: Family Medicine

## 2020-02-21 ENCOUNTER — Ambulatory Visit (INDEPENDENT_AMBULATORY_CARE_PROVIDER_SITE_OTHER): Payer: Medicare Other | Admitting: Family Medicine

## 2020-02-21 ENCOUNTER — Other Ambulatory Visit: Payer: Self-pay

## 2020-02-21 VITALS — BP 130/90 | HR 65 | Temp 97.5°F | Ht 64.0 in | Wt 175.0 lb

## 2020-02-21 DIAGNOSIS — J029 Acute pharyngitis, unspecified: Secondary | ICD-10-CM

## 2020-02-21 MED ORDER — AMOXICILLIN 500 MG PO CAPS: 500.0000 mg | ORAL_CAPSULE | Freq: Two times a day (BID) | ORAL | 0 refills | Status: DC

## 2020-02-21 MED ORDER — TRAMADOL HCL 50 MG PO TABS: ORAL_TABLET | ORAL | 1 refills | Status: DC

## 2020-02-21 NOTE — Progress Notes (Signed)
   Subjective:    Patient ID: Alexis Davis, female    DOB: [birth year], 79 y.o.   MRN: [id number]  Patient presents for Ear Pain (Left Ear)   Pt here left ear discomfort when she swallows + sore throat, feels raw in throat, no sores in mouth  no fever, no sinus drainage, no cough or congestion  she has not put anything in the ear  No sick contacts covid vaccine UTD   She has not taken her BP yet as she had not had anything to eat    Review Of Systems:  GEN- denies fatigue, fever, weight loss,weakness, recent illness HEENT- denies eye drainage, change in vision, nasal discharge, CVS- denies chest pain, palpitations RESP- denies SOB, cough, wheeze ABD- denies N/V, change in stools, abd pain Neuro- denies headache, dizziness, syncope, seizure activity       Objective:    BP 130/90   Pulse 65   Temp (!) 97.5 F (36.4 C) (Oral)   Ht 5\' 4"  (1.626 m)   Wt 175 lb (79.4 kg)   SpO2 98%   BMI 30.04 kg/m  GEN- NAD, alert and oriented x3 HEENT- PERRL, EOMI, non injected sclera, pink conjunctiva, MMM, oropharynx injected, no exudate, mild swelling left tonsil, tm CLEAR no effusion canal clear  Neck- Supple, + left ant cervical and ant auricular LAD CVS- RRR, no murmur RESP-CTAB EXT- No edema Pulses- Radial 2+        Assessment & Plan:      Problem List Items Addressed This Visit   None   Visit Diagnoses    Pharyngitis, unspecified etiology    -  Primary   Treat pharyngitis with lymphadenopathy,amoxicillin, salt water gargle, tylenol      Note: This dictation was prepared with Dragon dictation along with smaller phrase technology. Any transcriptional errors that result from this process are unintentional.

## 2020-02-21 NOTE — Patient Instructions (Signed)
F/U 4 months for wellness visit  

## 2020-03-07 ENCOUNTER — Telehealth: Payer: Self-pay | Admitting: Pharmacist

## 2020-03-07 NOTE — Progress Notes (Addendum)
Chronic Care Management Pharmacy Assistant   Name: Alexis Davis  MRN: [id number] DOB: [birth year]  Reason for Encounter: Disease State for HTN.  Patient Questions:  1.  Have you seen any other providers since your last visit? Yes.   2.  Any changes in your medicines or health? Yes.    PCP : Salley Scarlet, MD   Their chronic conditions include: hypertension, COPD, osteoarthritis, neuropathy.  Office Visits:  02/21/20 Dr. Jeanice Lim for pharyngitis. STARTED Amoxicillin 500 mg 2 times daily.   Consults: None since 01/18/20  Allergies:  No Known Allergies  Medications: Outpatient Encounter Medications as of 03/07/2020  Medication Sig   albuterol (VENTOLIN HFA) 108 (90 Base) MCG/ACT inhaler Inhale 2 puffs into the lungs every 4 (four) hours as needed for wheezing or shortness of breath.   alendronate (FOSAMAX) 70 MG tablet Take with a full glass of water on an empty stomach.   amLODipine (NORVASC) 5 MG tablet Take 1 tablet (5 mg total) by mouth daily.   amoxicillin (AMOXIL) 500 MG capsule Take 1 capsule (500 mg total) by mouth 2 (two) times daily.   Calcium Carbonate-Vitamin D (CALTRATE 600+D PO) Take 1 tablet by mouth 2 (two) times daily.   clotrimazole-betamethasone (LOTRISONE) cream Apply 1 application topically 2 (two) times daily.   COD LIVER OIL PO Take by mouth. 4 tablets daily   Cyanocobalamin (VITAMIN B 12) 100 MCG LOZG Take one PO QD   fluticasone (FLONASE) 50 MCG/ACT nasal spray Place 2 sprays into both nostrils daily.   fluticasone furoate-vilanterol (BREO ELLIPTA) 100-25 MCG/INH AEPB Inhale 1 puff into the lungs daily.   gabapentin (NEURONTIN) 300 MG capsule Take 1 capsule (300 mg total) by mouth 3 (three) times daily.   hydrochlorothiazide (HYDRODIURIL) 25 MG tablet Take 1 tablet (25 mg total) by mouth daily.   ipratropium (ATROVENT) 0.03 % nasal spray Place 2 sprays into both nostrils 2 (two) times daily as needed for rhinitis.   loratadine (CLARITIN) 10 MG  tablet Take 1 tablet (10 mg total) by mouth daily. For allergies   Magnesium 250 MG TABS Take 1 tablet (250 mg total) by mouth daily.   traMADol (ULTRAM) 50 MG tablet TAKE (1) TABLET BY MOUTH EVERY EIGHT HOURS AS NEEDED FOR PAIN.   No facility-administered encounter medications on file as of 03/07/2020.    Current Diagnosis: Patient Active Problem List   Diagnosis Date Noted   Seasonal allergies 07/19/2019   Peripheral polyneuropathy 11/24/2017   Vitamin D deficiency 06/22/2016   Varicose veins 03/16/2014   Tinea pedis 11/03/2013   OA (osteoarthritis) 11/03/2013   Osteoporosis 12/09/2012   Itching 12/09/2012   PTTD (posterior tibial tendon dysfunction) 09/01/2012   Neuropathy, leg 04/29/2012   Rotator cuff syndrome of right shoulder 11/26/2011   COPD, mild (HCC) 10/22/2008   ESSENTIAL HYPERTENSION, BENIGN 09/25/2008   Osteoarthritis of shoulder 06/12/2008   HEPATIC CYST 03/16/2006   DEGENERATION, DISC NOS 03/16/2006    Goals Addressed   None      Reviewed chart prior to disease state call. Spoke with patient regarding BP  Recent Office Vitals: BP Readings from Last 3 Encounters:  02/21/20 130/90  11/27/19 (!) 154/90  10/31/19 130/74   Pulse Readings from Last 3 Encounters:  02/21/20 65  11/27/19 77  10/31/19 68    Wt Readings from Last 3 Encounters:  02/21/20 175 lb (79.4 kg)  11/27/19 172 lb (78 kg)  10/31/19 179 lb (81.2 kg)     Kidney Function  Lab Results  Component Value Date/Time   CREATININE 0.60 07/19/2019 08:35 AM   CREATININE 0.62 01/17/2019 09:57 AM   GFRNONAA 87 11/24/2017 11:33 AM   GFRAA 101 11/24/2017 11:33 AM    BMP Latest Ref Rng & Units 07/19/2019 01/17/2019 09/28/2018  Glucose 65 - 99 mg/dL 87 74 96  BUN 7 - 25 mg/dL 12 8 11   Creatinine 0.60 - 0.88 mg/dL 0.60 0.62 0.68  BUN/Creat Ratio 6 - 22 (calc) NOT APPLICABLE NOT APPLICABLE NOT APPLICABLE  Sodium A999333 - 146 mmol/L 142 141 142  Potassium 3.5 - 5.3 mmol/L 5.1 5.1 5.1  Chloride 98 -  110 mmol/L 107 107 108  CO2 20 - 32 mmol/L 26 26 28   Calcium 8.6 - 10.4 mg/dL 9.7 9.8 10.4    Current antihypertensive regimen:  Amlodipine 5 mg daily  HCTZ 25 mg daily  How often are you checking your Blood Pressure? Patient stated she checks her blood pressure about 1-2x per week .  Current home BP readings: Patient stated she is not currently writing her blood pressure readings down.   What recent interventions/DTPs have been made by any provider to improve Blood Pressure control since last CPP Visit: None.  Any recent hospitalizations or ED visits since last visit with CPP? No.   What diet changes have been made to improve Blood Pressure Control?  Patient stated she is eating more vegetables.  What exercise is being done to improve your Blood Pressure Control?  Patient stated she walks for about 20-25 and does duties around the house.   Adherence Review: Is the patient currently on ACE/ARB medication? No.  Does the patient have >5 day gap between last estimated fill dates? No, CPP Please Check.  Patient stated she is not having any problems getting her medication at this time. Patient stated she does not have any questions or concerns about her health or medication at this time.   Follow-Up:  Pharmacist Review   Charlann Lange, RMA Clinical Pharmacist Assistant [phone]  6 minutes spent in review, coordination, and documentation.  Reviewed by: Beverly Milch, PharmD Clinical Pharmacist [facility] [phone]

## 2020-03-21 ENCOUNTER — Telehealth: Payer: Self-pay | Admitting: Pharmacist

## 2020-03-21 NOTE — Progress Notes (Signed)
    Chronic Care Management Pharmacy Assistant   Name: Alexis Davis  MRN: [id number] DOB: 1940/08/22  Reason for Encounter: Adherence Review  Patient Questions:  1.  Have you seen any other providers since your last visit? Yes.   2.  Any changes in your medicines or health? No.    PCP : Alycia Rossetti, MD  Allergies:  No Known Allergies  Verified Adherence Gap Information. Per insurance data patient has not met their annual wellness and wellness bundle screening. Their last AWV was on 01/17/19 Their most recent blood pressure was 154/90 on 11/27/19. Patient did not meet goal on their blood pressure reading it was greater than 140/90. Patients total gaps measure was equal to 3.   Follow-Up:  Pharmacist Review

## 2020-03-28 ENCOUNTER — Encounter: Payer: Self-pay | Admitting: Orthopedic Surgery

## 2020-03-28 ENCOUNTER — Ambulatory Visit: Payer: Medicare Other | Admitting: Orthopedic Surgery

## 2020-03-28 ENCOUNTER — Other Ambulatory Visit: Payer: Self-pay

## 2020-03-28 VITALS — BP 157/81 | HR 65 | Ht 64.0 in | Wt 175.0 lb

## 2020-03-28 DIAGNOSIS — G8929 Other chronic pain: Secondary | ICD-10-CM | POA: Diagnosis not present

## 2020-03-28 DIAGNOSIS — M25562 Pain in left knee: Secondary | ICD-10-CM

## 2020-03-28 NOTE — Progress Notes (Signed)
Chief Complaint  Patient presents with  . Knee Pain    Feels much better with left knee    80 year old female with pain left knee status post left knee injection back in September  X-ray shows 3 compartment degenerative changes  She was treated with Aleve and a hinged knee brace along with injection and she is noting significant improvement  Left knee exam Gait is normal Full range of motion was restored no tenderness or swelling Ligaments are stable  Encounter Diagnosis  Name Primary?  . Chronic pain of left knee Yes    Patient was released follow-up as needed

## 2020-04-11 ENCOUNTER — Telehealth: Payer: Self-pay

## 2020-04-22 ENCOUNTER — Other Ambulatory Visit: Payer: Self-pay | Admitting: Family Medicine

## 2020-06-05 ENCOUNTER — Ambulatory Visit (INDEPENDENT_AMBULATORY_CARE_PROVIDER_SITE_OTHER): Payer: Medicare Other | Admitting: Nurse Practitioner

## 2020-06-05 ENCOUNTER — Other Ambulatory Visit: Payer: Self-pay

## 2020-06-05 ENCOUNTER — Encounter (INDEPENDENT_AMBULATORY_CARE_PROVIDER_SITE_OTHER): Payer: Self-pay | Admitting: Nurse Practitioner

## 2020-06-05 VITALS — BP 126/74 | HR 56 | Temp 97.0°F | Ht 63.0 in | Wt 174.6 lb

## 2020-06-05 DIAGNOSIS — H6122 Impacted cerumen, left ear: Secondary | ICD-10-CM

## 2020-06-05 DIAGNOSIS — E559 Vitamin D deficiency, unspecified: Secondary | ICD-10-CM

## 2020-06-05 DIAGNOSIS — Z1329 Encounter for screening for other suspected endocrine disorder: Secondary | ICD-10-CM | POA: Diagnosis not present

## 2020-06-05 DIAGNOSIS — Z6830 Body mass index (BMI) 30.0-30.9, adult: Secondary | ICD-10-CM

## 2020-06-05 DIAGNOSIS — E669 Obesity, unspecified: Secondary | ICD-10-CM

## 2020-06-05 DIAGNOSIS — E785 Hyperlipidemia, unspecified: Secondary | ICD-10-CM | POA: Diagnosis not present

## 2020-06-05 NOTE — Patient Instructions (Signed)
Debrox - administer 5 drops into the left ear twice a day for 4 days then stop

## 2020-06-05 NOTE — Progress Notes (Signed)
Subjective:  Patient ID: Alexis Davis, female    DOB: 1940/11/12  Age: 80 y.o. MRN: [id number]  CC:  Chief Complaint  Patient presents with  . Establish Care    Doing okay, left ear feels clogged      HPI  This patient arrives today to establish care at this office.  She was seen Dr. Buelah Manis who is recently left the area and so she needs a new primary care provider.  She tells me overall she is feeling well, but does have a concern regarding her left ear feeling clogged and uncomfortable at times.  She tells me her left ear has been intermittently uncomfortable and she experiences a sensation of it being clogged over the last 3 weeks.  She tells me the discomfort can get up to 8/10 in intensity.  She does not know any significant triggering factors but does notice that the symptoms seem to worsen as the day goes on.  She denies any ear drainage, fever, change in ability to hear.  She does take Flonase and Claritin on a daily basis.  I have briefly reviewed her chart and I do see that she has had low vitamin D in the past.  She is currently on a vitamin D3 supplement.  Last serum check was collected in November 2020 and it was 54.  She also has a history of hyperlipidemia and last the panel was collected in November 2020 and her LDL at that time was 103.  She is not currently on statin therapy.  She has also had elevated glucose in the past on her CMP, however I do not see where A1c has been checked.  Her last 5 metabolic panels have shown normal blood sugar.  Past Medical History:  Diagnosis Date  . Arthritis    Shoulder  . Compression fracture of L4 lumbar vertebra    Pt did not have pain, osteoporosis  . COPD (chronic obstructive pulmonary disease) ([place]) 2010   PFT  . Hepatic cyst   . Hypertension   . Osteoporosis       Family History  Problem Relation Age of Onset  . Diabetes Mother   . Heart disease Father   . Hypertension Sister   . Hypertension Brother   .  Hypertension Sister   . Hypertension Sister   . Arthritis Other     Social History   Social History Narrative  . Not on file   Social History   Tobacco Use  . Smoking status: Former Research scientist (life sciences)  . Smokeless tobacco: Never Used  . Tobacco comment: does smoke every now and then  Substance Use Topics  . Alcohol use: No     Current Meds  Medication Sig  . albuterol (VENTOLIN HFA) 108 (90 Base) MCG/ACT inhaler Inhale 2 puffs into the lungs every 4 (four) hours as needed for wheezing or shortness of breath.  Marland Kitchen alendronate (FOSAMAX) 70 MG tablet Take with a full glass of water on an empty stomach.  Marland Kitchen amLODipine (NORVASC) 5 MG tablet Take 1 tablet (5 mg total) by mouth daily.  . Calcium Carbonate-Vitamin D (CALTRATE 600+D PO) Take 1 tablet by mouth 2 (two) times daily.  . clotrimazole-betamethasone (LOTRISONE) cream Apply 1 application topically 2 (two) times daily.  . COD LIVER OIL PO Take by mouth. 4 tablets daily  . Cyanocobalamin (VITAMIN B 12) 100 MCG LOZG Take one PO QD  . fluticasone (FLONASE) 50 MCG/ACT nasal spray Place 2 sprays into both nostrils  daily.  . fluticasone furoate-vilanterol (BREO ELLIPTA) 100-25 MCG/INH AEPB Inhale 1 puff into the lungs daily.  Marland Kitchen gabapentin (NEURONTIN) 300 MG capsule Take 1 capsule (300 mg total) by mouth 3 (three) times daily.  . hydrochlorothiazide (HYDRODIURIL) 25 MG tablet Take 1 tablet (25 mg total) by mouth daily.  Marland Kitchen ipratropium (ATROVENT) 0.03 % nasal spray Place 2 sprays into both nostrils 2 (two) times daily as needed for rhinitis.  Marland Kitchen loratadine (CLARITIN) 10 MG tablet Take 1 tablet (10 mg total) by mouth daily. For allergies  . Magnesium 250 MG TABS Take 1 tablet (250 mg total) by mouth daily.  . traMADol (ULTRAM) 50 MG tablet TAKE (1) TABLET BY MOUTH EVERY EIGHT HOURS AS NEEDED FOR PAIN.    ROS:  Review of Systems  Constitutional: Negative for fever.  HENT: Positive for ear pain. Negative for congestion, ear discharge, hearing loss,  sinus pain and tinnitus.   Respiratory: Negative for shortness of breath.   Cardiovascular: Negative for chest pain.  Neurological: Negative for dizziness and headaches.     Objective:   Today's Vitals: BP 126/74   Pulse (!) 56   Temp (!) 97 F (36.1 C) (Temporal)   Ht 5' 3"  (1.6 m)   Wt 174 lb 9.6 oz (79.2 kg)   SpO2 96%   BMI 30.93 kg/m  Vitals with BMI 06/05/2020 03/28/2020 02/21/2020  Height 5' 3"  5' 4"  5' 4"   Weight 174 lbs 10 oz 175 lbs 175 lbs  BMI 30.94 75.17 00.17  Systolic 494 496 759  Diastolic 74 81 90  Pulse 56 65 65     Physical Exam Vitals reviewed.  Constitutional:      General: She is not in acute distress.    Appearance: Normal appearance.  HENT:     Head: Normocephalic and atraumatic.     Right Ear: Hearing, tympanic membrane, ear canal and external ear normal.     Left Ear: Hearing, ear canal and external ear normal. There is impacted cerumen.  Neck:     Vascular: No carotid bruit.  Cardiovascular:     Rate and Rhythm: Normal rate and regular rhythm.     Pulses: Normal pulses.     Heart sounds: Normal heart sounds.  Pulmonary:     Effort: Pulmonary effort is normal.     Breath sounds: Normal breath sounds.  Skin:    General: Skin is warm and dry.  Neurological:     General: No focal deficit present.     Mental Status: She is alert and oriented to person, place, and time.  Psychiatric:        Mood and Affect: Mood normal.        Behavior: Behavior normal.        Judgment: Judgment normal.          Assessment and Plan   1. Impacted cerumen of left ear   2. Vitamin D deficiency   3. Hyperlipidemia, unspecified hyperlipidemia type   4. Thyroid disorder screen   5. Class 1 obesity with serious comorbidity and body mass index (BMI) of 30.0 to 30.9 in adult, unspecified obesity type      Plan: 1.  Wondering if the discomfort and feeling of a clogged ear is coming from the ear wax in her left ear.  For now I recommend she try  over-the-counter Debrox drops and if symptoms persist to let us know and she can be seen sooner than her next appointment for further evaluation.  She  tells me she understands. 2.  We will check serum vitamin D level for further evaluation. 3.  We will check lipid panel today further evaluation. 4., 5.  I have not seen TSH checked over the last few years so we will screen her for thyroid dysfunction today by checking his TSH.    Tests ordered Orders Placed This Encounter  Procedures  . CMP with eGFR([facility])  . TSH  . Lipid Panel  . Vitamin D, 25-hydroxy      No orders of the defined types were placed in this encounter.   Patient to follow-up in 1 month or sooner as needed.  Ailene Ards, NP

## 2020-06-06 ENCOUNTER — Other Ambulatory Visit (INDEPENDENT_AMBULATORY_CARE_PROVIDER_SITE_OTHER): Payer: Self-pay | Admitting: Nurse Practitioner

## 2020-06-06 DIAGNOSIS — E559 Vitamin D deficiency, unspecified: Secondary | ICD-10-CM

## 2020-06-06 LAB — COMPLETE METABOLIC PANEL WITH GFR
AG Ratio: 1.7 (calc) (ref 1.0–2.5)
ALT: 13 U/L (ref 6–29)
AST: 16 U/L (ref 10–35)
Albumin: 3.8 g/dL (ref 3.6–5.1)
Alkaline phosphatase (APISO): 67 U/L (ref 37–153)
BUN: 17 mg/dL (ref 7–25)
CO2: 29 mmol/L (ref 20–32)
Calcium: 10 mg/dL (ref 8.6–10.4)
Chloride: 104 mmol/L (ref 98–110)
Creat: 0.75 mg/dL (ref 0.60–0.88)
GFR, Est African American: 87 mL/min/{1.73_m2} (ref >=60)
GFR, Est Non African American: 75 mL/min/{1.73_m2} (ref >=60)
Globulin: 2.2 g/dL (calc) (ref 1.9–3.7)
Glucose, Bld: 87 mg/dL (ref 65–139)
Potassium: 4.3 mmol/L (ref 3.5–5.3)
Sodium: 140 mmol/L (ref 135–146)
Total Bilirubin: 0.4 mg/dL (ref 0.2–1.2)
Total Protein: 6 g/dL — ABNORMAL LOW (ref 6.1–8.1)

## 2020-06-06 LAB — VITAMIN D 25 HYDROXY (VIT D DEFICIENCY, FRACTURES): Vit D, 25-Hydroxy: 26 ng/mL — ABNORMAL LOW (ref 30–100)

## 2020-06-06 LAB — TSH: TSH: 1.6 mIU/L (ref 0.40–4.50)

## 2020-06-06 LAB — LIPID PANEL
Cholesterol: 167 mg/dL (ref <200)
HDL: 46 mg/dL — ABNORMAL LOW (ref >=50)
LDL Cholesterol (Calc): 99 mg/dL (calc)
Non-HDL Cholesterol (Calc): 121 mg/dL (calc) (ref <130)
Total CHOL/HDL Ratio: 3.6 (calc) (ref <5.0)
Triglycerides: 122 mg/dL (ref <150)

## 2020-06-06 MED ORDER — VITAMIN D3 50 MCG (2000 UT) PO CAPS: 2000.0000 [IU] | ORAL_CAPSULE | Freq: Every day | ORAL | 2 refills | Status: DC

## 2020-07-16 ENCOUNTER — Encounter (INDEPENDENT_AMBULATORY_CARE_PROVIDER_SITE_OTHER): Payer: Self-pay | Admitting: Internal Medicine

## 2020-07-16 ENCOUNTER — Ambulatory Visit (INDEPENDENT_AMBULATORY_CARE_PROVIDER_SITE_OTHER): Payer: Medicare Other | Admitting: Internal Medicine

## 2020-07-16 ENCOUNTER — Other Ambulatory Visit: Payer: Self-pay

## 2020-07-16 VITALS — BP 138/70 | HR 89 | Temp 97.2°F | Resp 18 | Ht 63.0 in | Wt 176.8 lb

## 2020-07-16 DIAGNOSIS — I10 Essential (primary) hypertension: Secondary | ICD-10-CM

## 2020-07-16 DIAGNOSIS — E559 Vitamin D deficiency, unspecified: Secondary | ICD-10-CM | POA: Diagnosis not present

## 2020-07-16 DIAGNOSIS — H66002 Acute suppurative otitis media without spontaneous rupture of ear drum, left ear: Secondary | ICD-10-CM

## 2020-07-16 MED ORDER — AMOXICILLIN-POT CLAVULANATE 875-125 MG PO TABS: 1.0000 | ORAL_TABLET | Freq: Two times a day (BID) | ORAL | 0 refills | Status: DC

## 2020-07-16 NOTE — Progress Notes (Signed)
Metrics: Intervention Frequency ACO  Documented Smoking Status Yearly  Screened one or more times in 24 months  Cessation Counseling or  Active cessation medication Past 24 months  Past 24 months   Guideline developer: UpToDate (See UpToDate for funding source) Date Released: 2014       Wellness Office Visit  Subjective:  Patient ID: Alexis Davis, female    DOB: Feb 28, 1941  Age: 80 y.o. MRN: [id number]  CC: This lady comes in for follow-up of hypertension, vitamin D deficiency. HPI  She had seen Judson Roch as a new patient about a month ago.  Her vitamin D levels were extremely low and she was recommended to start taking vitamin D3 2000 units daily.  She has been doing this on a regular basis. She continues with amlodipine 5 mg daily for her hypertension. She has no history of coronary artery disease or cerebrovascular disease. She does describe left ear discomfort/pain for the last 1 month.  There is no fever.  She thinks possibly she may have reduced hearing in that ear. Past Medical History:  Diagnosis Date  . Arthritis    Shoulder  . Compression fracture of L4 lumbar vertebra    Pt did not have pain, osteoporosis  . COPD (chronic obstructive pulmonary disease) ([place]) 2010   PFT  . Hepatic cyst   . Hypertension   . Osteoporosis    Past Surgical History:  Procedure Laterality Date  . ABDOMINAL HYSTERECTOMY    . CATARACT EXTRACTION W/PHACO Right 08/23/2014   Procedure: CATARACT EXTRACTION PHACO AND INTRAOCULAR LENS PLACEMENT RIGHT EYE; CDE:  10.80;  Surgeon: Tonny Branch, MD;  Location: [facility];  Service: Ophthalmology;  Laterality: Right;  . CATARACT EXTRACTION W/PHACO Left 10/08/2014   Procedure: CATARACT EXTRACTION PHACO AND INTRAOCULAR LENS PLACEMENT (IOC);  Surgeon: Tonny Branch, MD;  Location: [facility];  Service: Ophthalmology;  Laterality: Left;  CDE 6.60     Family History  Problem Relation Age of Onset  . Diabetes Mother   . Heart disease Father   . Hypertension Sister    . Hypertension Brother   . Hypertension Sister   . Hypertension Sister   . Arthritis Other     Social History   Social History Narrative  . Not on file   Social History   Tobacco Use  . Smoking status: Former Research scientist (life sciences)  . Smokeless tobacco: Never Used  . Tobacco comment: does smoke every now and then  Substance Use Topics  . Alcohol use: No    Current Meds  Medication Sig  . albuterol (VENTOLIN HFA) 108 (90 Base) MCG/ACT inhaler Inhale 2 puffs into the lungs every 4 (four) hours as needed for wheezing or shortness of breath.  Marland Kitchen alendronate (FOSAMAX) 70 MG tablet Take with a full glass of water on an empty stomach.  Marland Kitchen amLODipine (NORVASC) 5 MG tablet Take 1 tablet (5 mg total) by mouth daily.  Marland Kitchen amoxicillin-clavulanate (AUGMENTIN) 875-125 MG tablet Take 1 tablet by mouth 2 (two) times daily.  . Calcium Carbonate-Vitamin D (CALTRATE 600+D PO) Take 1 tablet by mouth 2 (two) times daily.  . Cholecalciferol (VITAMIN D3) 50 MCG (2000 UT) capsule Take 1 capsule (2,000 Units total) by mouth daily.  . clotrimazole-betamethasone (LOTRISONE) cream Apply 1 application topically 2 (two) times daily.  . COD LIVER OIL PO Take by mouth. 4 tablets daily  . Cyanocobalamin (VITAMIN B 12) 100 MCG LOZG Take one PO QD  . fluticasone (FLONASE) 50 MCG/ACT nasal spray Place 2 sprays into both  nostrils daily.  . fluticasone furoate-vilanterol (BREO ELLIPTA) 100-25 MCG/INH AEPB Inhale 1 puff into the lungs daily.  Marland Kitchen gabapentin (NEURONTIN) 300 MG capsule Take 1 capsule (300 mg total) by mouth 3 (three) times daily.  . hydrochlorothiazide (HYDRODIURIL) 25 MG tablet Take 1 tablet (25 mg total) by mouth daily.  Marland Kitchen ipratropium (ATROVENT) 0.03 % nasal spray Place 2 sprays into both nostrils 2 (two) times daily as needed for rhinitis.  Marland Kitchen loratadine (CLARITIN) 10 MG tablet Take 1 tablet (10 mg total) by mouth daily. For allergies  . Magnesium 250 MG TABS Take 1 tablet (250 mg total) by mouth daily.  . traMADol  (ULTRAM) 50 MG tablet TAKE (1) TABLET BY MOUTH EVERY EIGHT HOURS AS NEEDED FOR PAIN.     [place] Office Visit from 06/05/2020 in [facility]  PHQ-9 Total Score 0      Objective:   Today's Vitals: BP 138/70 (BP Location: Right Arm, Patient Position: Sitting, Cuff Size: Normal)   Pulse 89   Temp (!) 97.2 F (36.2 C) (Temporal)   Resp 18   Ht 5\' 3"  (1.6 m)   Wt 176 lb 12.8 oz (80.2 kg)   SpO2 99%   BMI 31.32 kg/m  Vitals with BMI 07/16/2020 06/05/2020 03/28/2020  Height 5\' 3"  5\' 3"  5\' 4"   Weight 176 lbs 13 oz 174 lbs 10 oz 175 lbs  BMI 31.33 11.94 17.40  Systolic 814 481 856  Diastolic 70 74 81  Pulse 89 56 65     Physical Exam  She looks systemically well.  Weight is stable.  Blood pressure is acceptable for her age.  She is alert and orientated without any focal joint signs.  Left tympanic membrane appears to be cloudy/mildly inflamed.     Assessment   1. Vitamin D deficiency   2. Essential hypertension, benign   3. Non-recurrent acute suppurative otitis media of left ear without spontaneous rupture of tympanic membrane       Tests ordered Orders Placed This Encounter  Procedures  . VITAMIN D 25 Hydroxy (Vit-D Deficiency, Fractures)     Plan: 1. She will continue with amlodipine for hypertension which is under reasonably good control. 2. She will continue with vitamin D3 2000 units daily and we will check level today. 3. I will treat her for otitis media on the left side with Augmentin.  If she does not improve after the course of antibiotics, she will let me know and I would probably refer her to an ENT surgeon. 4. Follow-up with Judson Roch in about 6 months.   Meds ordered this encounter  Medications  . amoxicillin-clavulanate (AUGMENTIN) 875-125 MG tablet    Sig: Take 1 tablet by mouth 2 (two) times daily.    Dispense:  14 tablet    Refill:  0    Kazia Grisanti Luther Parody, MD

## 2020-10-03 ENCOUNTER — Encounter (INDEPENDENT_AMBULATORY_CARE_PROVIDER_SITE_OTHER): Payer: Self-pay

## 2020-10-07 ENCOUNTER — Ambulatory Visit: Payer: Medicare Other

## 2020-10-07 ENCOUNTER — Encounter: Payer: Self-pay | Admitting: Orthopedic Surgery

## 2020-10-07 ENCOUNTER — Other Ambulatory Visit: Payer: Self-pay

## 2020-10-07 ENCOUNTER — Ambulatory Visit (INDEPENDENT_AMBULATORY_CARE_PROVIDER_SITE_OTHER): Payer: Medicare Other | Admitting: Orthopedic Surgery

## 2020-10-07 VITALS — BP 145/82 | HR 56 | Ht 64.0 in | Wt 175.8 lb

## 2020-10-07 DIAGNOSIS — M25561 Pain in right knee: Secondary | ICD-10-CM | POA: Diagnosis not present

## 2020-10-07 MED ORDER — MELOXICAM 7.5 MG PO TABS: 7.5000 mg | ORAL_TABLET | Freq: Every day | ORAL | 5 refills | Status: DC

## 2020-10-07 NOTE — Progress Notes (Signed)
Chief Complaint  Patient presents with   Knee Pain    /Right/DOI 09/19/20 pt was gathering potatoes from garden and that night couldn't really walk on that leg/ pt states she was just bending over like you would to touch your toes, not squatting   Roye was picking potatoes and woke up the next day with lateral right knee pain, instability giving way inability put pressure on the knee and a feeling like the knee was shifting  Denies back pain numbness or tingling pain radiates from the right lateral knee joint distally to about the mid lower leg  Past Medical History:  Diagnosis Date   Arthritis    Shoulder   Compression fracture of L4 lumbar vertebra    Pt did not have pain, osteoporosis   COPD (chronic obstructive pulmonary disease) ([place]) 2010   PFT   Hepatic cyst    Hypertension    Osteoporosis    BP (!) 145/82   Pulse (!) 56   Ht '5\' 4"'$  (1.626 m)   Wt 175 lb 12.8 oz (79.7 kg)   BMI 30.18 kg/m   Focused right knee examination reveals tenderness along the lateral joint line without effusion full range of motion is noted with some tenderness on the lateral lower leg area but no tenderness in the thigh or lower back  Full range of motion without pain  Knee is stable in all directions  Imaging shows normal alignment minimal arthritic changes  Recommend right knee injection Econ hinge  Meloxicam  See me in 4 weeks Take it easy next 4 weeks  Procedure note  Injection  Verbal consent was obtained to inject the right knee  Timeout procedure was completed to confirm injection site  Diagnosis acute knee pain  Medications used 6 mg Celestone Lidocaine 1% plain 3 cc  Anesthesia was provided by ethyl chloride spray  Prep was performed with alcohol  Technique of injection with the knee in flexion lateral portal was used to inject the knee  No complications were noted  Acute uncomplicated illness injury, x-ray done in the office, injection prescription drug  management with appropriate risk factors discussed

## 2020-10-07 NOTE — Patient Instructions (Addendum)
Take your tramadol   Wear knee brace   And   Meds ordered this encounter  Medications   meloxicam (MOBIC) 7.5 MG tablet    Sig: Take 1 tablet (7.5 mg total) by mouth daily.    Dispense:  30 tablet    Refill:  5    Take it easy for next 4 weeks   You have received an injection of steroids into the joint. 15% of patients will have increased pain within the 24 hours postinjection.   This is transient and will go away.   We recommend that you use ice packs on the injection site for 20 minutes every 2 hours and extra strength Tylenol 2 tablets every 8 as needed until the pain resolves.  If you continue to have pain after taking the Tylenol and using the ice please call the office for further instructions.

## 2020-11-07 ENCOUNTER — Encounter: Payer: Self-pay | Admitting: Orthopedic Surgery

## 2020-11-07 ENCOUNTER — Ambulatory Visit: Payer: Medicare Other | Admitting: Orthopedic Surgery

## 2020-11-07 ENCOUNTER — Other Ambulatory Visit: Payer: Self-pay

## 2020-11-07 VITALS — BP 172/86 | HR 59 | Ht 64.0 in | Wt 174.0 lb

## 2020-11-07 DIAGNOSIS — M25561 Pain in right knee: Secondary | ICD-10-CM

## 2020-11-07 NOTE — Progress Notes (Signed)
Chief Complaint  Patient presents with   Knee Pain    Right feels much better no pain    Alexis Davis is 80 years old she is doing well after treatment for her right knee pain  She has no symptoms at this time  Her exam is normal  Encounter Diagnosis  Name Primary?   Acute pain of right knee Yes    Follow-up as needed

## 2020-12-16 ENCOUNTER — Ambulatory Visit: Payer: Self-pay | Admitting: *Deleted

## 2020-12-16 NOTE — Telephone Encounter (Signed)
Reason for Disposition  [1] Caller requesting NON-URGENT health information AND [2] PCP's office is the best resource  Answer Assessment - Initial Assessment Questions 1. REASON FOR CALL or QUESTION: "What is your reason for calling today?" or "How can I best help you?" or "What question do you have that I can help answer?"     Requesting advise how to get medications ordered until seen in January 2023 by new provider.  Protocols used: Information Only Call - No Triage-A-AH

## 2020-12-16 NOTE — Telephone Encounter (Signed)
Patient called in to say that she is not able to get her medication since the Dr they were seeing died. She is scheduled with another provider but not till January of 2023. Please advise Ph# [phone]   Called patient and husband and reviewed advise regarding medications. Patient and husband advised to contact new provider office and see if new appt can be sooner due to circumstances. Advised patient and husband if unable to see new provider can try to go to UC or HD or go to [facility] in [place] [phone]. Patient and husband verbalized understanding of care advise and to UC or call new provider office.

## 2021-01-16 ENCOUNTER — Ambulatory Visit (INDEPENDENT_AMBULATORY_CARE_PROVIDER_SITE_OTHER): Payer: Medicare Other | Admitting: Internal Medicine

## 2021-01-28 ENCOUNTER — Ambulatory Visit: Payer: Medicare Other | Admitting: Nurse Practitioner

## 2021-01-29 ENCOUNTER — Ambulatory Visit (INDEPENDENT_AMBULATORY_CARE_PROVIDER_SITE_OTHER): Payer: Medicare Other

## 2021-01-29 ENCOUNTER — Ambulatory Visit: Payer: Medicare Other | Admitting: Orthopedic Surgery

## 2021-01-29 ENCOUNTER — Other Ambulatory Visit: Payer: Self-pay

## 2021-01-29 DIAGNOSIS — M25552 Pain in left hip: Secondary | ICD-10-CM | POA: Diagnosis not present

## 2021-01-29 DIAGNOSIS — M1612 Unilateral primary osteoarthritis, left hip: Secondary | ICD-10-CM

## 2021-01-29 MED ORDER — METHYLPREDNISOLONE ACETATE 40 MG/ML IJ SUSP
40.0000 mg | Freq: Once | INTRAMUSCULAR | Status: AC
  Administered 2021-01-29: 40 mg via INTRAMUSCULAR

## 2021-01-29 MED ORDER — TRAMADOL HCL 50 MG PO TABS: 50.0000 mg | ORAL_TABLET | Freq: Four times a day (QID) | ORAL | 0 refills | Status: AC | PRN

## 2021-01-29 NOTE — Progress Notes (Signed)
  Chief Complaint  Patient presents with   Leg Pain    Quad pain Pain starts at hip and radiates down to ankle X 2 weeks   Knee Pain    LT knee    History this is an 80 year old female presents with pain in her left groin  She denies any trauma.  She says the pain is definitely in the groin and radiates down the front of the leg and she is using a cane  She is currently on meloxicam for arthritis 1 a day at 7.5 mg  Review of systems denies any back pain denies any numbness or tingling in the left leg  .pmh Past Medical History:  Diagnosis Date   Arthritis    Shoulder   Compression fracture of L4 lumbar vertebra    Pt did not have pain, osteoporosis   COPD (chronic obstructive pulmonary disease) ([place]) 2010   PFT   Hepatic cyst    Hypertension    Osteoporosis    Alexis Davis is using a cane and limping favoring her left leg she has no tenderness around the hip she does have painful range of motion especially with flexion internal rotation and it is in the left groin her hip feels stable muscle strength and tone are normal the skin is intact without rash no sensory deficits are noted she is oriented x3 mood and affect are normal  Pelvis and left hip x-rays show mild to moderate osteoarthritis of the left hip with no deformity and femoral head is intact  Recommend IM injection 40 mg of Depo-Medrol by the nurse  Start Ultram 50 mg every 6 hours  Follow-up in 2 weeks if no improvement recommend intra-articular injection left hip.  Meds ordered this encounter  Medications   traMADol (ULTRAM) 50 MG tablet    Sig: Take 1 tablet (50 mg total) by mouth every 6 (six) hours as needed for up to 5 days.    Dispense:  20 tablet    Refill:  0   methylPREDNISolone acetate (DEPO-MEDROL) injection 40 mg

## 2021-01-29 NOTE — Progress Notes (Signed)
Dg hip 

## 2021-01-30 ENCOUNTER — Telehealth: Payer: Self-pay | Admitting: Orthopedic Surgery

## 2021-01-30 NOTE — Telephone Encounter (Signed)
-----  Message from Candice Camp, RT sent at 01/29/2021  1:13 PM EST ----- Regarding: FW: Is pt Ret as needed? Instructions   Return in about 2 weeks (around 02/12/2021) for FOLLOW UP, LEFT, HIP.  ----- Message ----- From: Uvaldo Bristle Sent: 01/29/2021  12:20 PM EST To: Candice Camp, RT Subject: Is pt Ret as needed?                           Is Ms. Dorough as needed?

## 2021-01-30 NOTE — Telephone Encounter (Signed)
Done as noted; patient aware of appointment.

## 2021-02-04 ENCOUNTER — Telehealth: Payer: Self-pay | Admitting: Orthopedic Surgery

## 2021-02-04 NOTE — Telephone Encounter (Signed)
Per note, give sooner apointment -  given, 02/10/21, from 02/13/21. Please advise.

## 2021-02-04 NOTE — Telephone Encounter (Signed)
  Start Ultram 50 mg every 6 hours per your office note, pended   Asked carol about sooner appointment

## 2021-02-04 NOTE — Telephone Encounter (Signed)
Called patient; notified of sooner appointment.

## 2021-02-04 NOTE — Telephone Encounter (Signed)
Patient is asking if she may see Dr Aline Brochure sooner than scheduled visit 02/13/21, following office visit with Xrays and injection on 01/29/21, or if there may be something that can be prescribed? States hip and leg are still hurting. Please call home# [phone]

## 2021-02-05 ENCOUNTER — Other Ambulatory Visit: Payer: Self-pay | Admitting: Orthopedic Surgery

## 2021-02-05 MED ORDER — TRAMADOL-ACETAMINOPHEN 37.5-325 MG PO TABS: 1.0000 | ORAL_TABLET | ORAL | 0 refills | Status: AC | PRN

## 2021-02-05 NOTE — Progress Notes (Signed)
Meds ordered this encounter  Medications   traMADol-acetaminophen (ULTRACET) 37.5-325 MG tablet    Sig: Take 1 tablet by mouth every 4 (four) hours as needed for up to 5 days.    Dispense:  30 tablet    Refill:  0

## 2021-02-05 NOTE — Telephone Encounter (Signed)
(  1) called back to patient to move appointment back to original date per Dr Ruthe Mannan staff message: / patient aware of date and time: 02/13/21.at 10:50am  "Carole Civil, MD sent to [facility] Doesn't need sooner appt  Keep the a[ppt as originally scheduled"  (2) patient states she has been to [place] twice and was told they do not have the prescription Dr Aline Brochure was ordering as of 02/04/21.

## 2021-02-05 NOTE — Telephone Encounter (Signed)
Did you want her to have the tramadol? Your note said you were sending it in but it didn't get sent  Pended,

## 2021-02-10 ENCOUNTER — Ambulatory Visit: Payer: Medicare Other | Admitting: Orthopedic Surgery

## 2021-02-13 ENCOUNTER — Ambulatory Visit: Payer: Medicare Other | Admitting: Orthopedic Surgery

## 2021-02-17 ENCOUNTER — Encounter: Payer: Self-pay | Admitting: Orthopedic Surgery

## 2021-02-17 ENCOUNTER — Ambulatory Visit: Payer: Medicare Other | Admitting: Orthopedic Surgery

## 2021-02-17 ENCOUNTER — Telehealth: Payer: Self-pay | Admitting: Radiology

## 2021-02-17 ENCOUNTER — Other Ambulatory Visit: Payer: Self-pay

## 2021-02-17 DIAGNOSIS — M1612 Unilateral primary osteoarthritis, left hip: Secondary | ICD-10-CM

## 2021-02-17 DIAGNOSIS — M25552 Pain in left hip: Secondary | ICD-10-CM

## 2021-02-17 MED ORDER — HYDROCODONE-ACETAMINOPHEN 7.5-325 MG PO TABS: 1.0000 | ORAL_TABLET | ORAL | 0 refills | Status: AC | PRN

## 2021-02-17 NOTE — Patient Instructions (Signed)
F/u 2 weeks after hip joint injection

## 2021-02-17 NOTE — Telephone Encounter (Signed)
Call patient when hip injection is scheduled at [facility]

## 2021-02-17 NOTE — Progress Notes (Signed)
Chief Complaint  Patient presents with   Hip Pain    Left    Alexis Davis continues to have pain in her left groin radiating to her left knee  Her x-ray showed a moderate amount of arthritis in the left hip joint  A trial of Ultram and prednisone did not help the pain  After reconsideration of the images she does have the arthritis in the left hip moderate  Recommend intra-articular injection left hip  Patient will call us after the injection to reevaluate to see if this helped if not as I told her she would probably looking at a total hip replacement   Meds ordered this encounter  Medications   HYDROcodone-acetaminophen (NORCO) 7.5-325 MG tablet    Sig: Take 1 tablet by mouth every 4 (four) hours as needed for up to 5 days for moderate pain.    Dispense:  30 tablet    Refill:  0

## 2021-02-18 NOTE — Telephone Encounter (Signed)
27th 1030 arrival for 11 am injection  I called her to advise.

## 2021-02-25 ENCOUNTER — Encounter (HOSPITAL_COMMUNITY): Payer: Self-pay

## 2021-02-25 ENCOUNTER — Ambulatory Visit (HOSPITAL_COMMUNITY)
Admission: RE | Admit: 2021-02-25 | Discharge: 2021-02-25 | Disposition: A | Discharge status: Home / Self Care | Payer: Medicare Other | Source: Ambulatory Visit | Attending: Orthopedic Surgery | Admitting: Orthopedic Surgery

## 2021-02-25 ENCOUNTER — Other Ambulatory Visit: Payer: Self-pay

## 2021-02-25 DIAGNOSIS — M1612 Unilateral primary osteoarthritis, left hip: Secondary | ICD-10-CM | POA: Insufficient documentation

## 2021-02-25 DIAGNOSIS — M25552 Pain in left hip: Secondary | ICD-10-CM | POA: Insufficient documentation

## 2021-02-25 MED ORDER — LIDOCAINE HCL (PF) 1 % IJ SOLN
INTRAMUSCULAR | Status: AC
  Administered 2021-02-25: 10:00:00 5 mL
  Filled 2021-02-25: qty 5

## 2021-02-25 MED ORDER — METHYLPREDNISOLONE ACETATE 40 MG/ML IJ SUSP
INTRAMUSCULAR | Status: AC
  Administered 2021-02-25: 11:00:00 40 mg
  Filled 2021-02-25: qty 1

## 2021-02-25 MED ORDER — BUPIVACAINE HCL (PF) 0.5 % IJ SOLN
INTRAMUSCULAR | Status: AC
  Administered 2021-02-25: 10:00:00 5 mL
  Filled 2021-02-25: qty 30

## 2021-02-25 NOTE — Procedures (Signed)
CLINICAL DATA: Left hip arthritis, left hip pain EXAM: LEFT HIP INJECTION UNDER FLUOROSCOPY COMPARISON: None. FLUOROSCOPY TIME: Fluoroscopy Time:  0 minutes, 36 seconds Radiation Exposure Index (if provided by the fluoroscopic device):  4.0 mGy Number of Acquired Spot Images: 0 PROCEDURE:  I discussed the risks (including hemorrhage, infection, and allergic reaction, among others), benefits, and alternatives to the procedure with the patient.  We specifically discussed the high technical likelihood of success of the procedure.  The patient understood and elected to undergo the procedure.    Standard time-out was employed.  Following sterile skin prep and local anesthetic administration consisting of 1% lidocaine, a 22 gauge needle was advanced without difficulty into the left hip joint under fluoroscopic guidance.  Substantial osteoarthritis of the left hip is present.  Loss of resistance to injection was encountered.  A total of 5 cc of a combination of 5 cc Sensorcaine and 40 mg of Depo-Medrol was injected into the joint.  The needle was subsequently removed and the skin cleansed and bandaged.  No immediate complications were observed.    IMPRESSION: Technically successful fluoroscopically guided therapeutic left hip joint injection.

## 2021-03-03 ENCOUNTER — Ambulatory Visit (INDEPENDENT_AMBULATORY_CARE_PROVIDER_SITE_OTHER): Payer: Medicare Other | Admitting: Nurse Practitioner

## 2021-03-03 ENCOUNTER — Encounter: Payer: Self-pay | Admitting: Nurse Practitioner

## 2021-03-03 ENCOUNTER — Other Ambulatory Visit: Payer: Self-pay

## 2021-03-03 VITALS — BP 138/78 | HR 68 | Ht 64.0 in | Wt 167.0 lb

## 2021-03-03 DIAGNOSIS — J449 Chronic obstructive pulmonary disease, unspecified: Secondary | ICD-10-CM | POA: Diagnosis not present

## 2021-03-03 DIAGNOSIS — J302 Other seasonal allergic rhinitis: Secondary | ICD-10-CM | POA: Diagnosis not present

## 2021-03-03 DIAGNOSIS — G5790 Unspecified mononeuropathy of unspecified lower limb: Secondary | ICD-10-CM | POA: Diagnosis not present

## 2021-03-03 DIAGNOSIS — M810 Age-related osteoporosis without current pathological fracture: Secondary | ICD-10-CM

## 2021-03-03 DIAGNOSIS — E559 Vitamin D deficiency, unspecified: Secondary | ICD-10-CM | POA: Diagnosis not present

## 2021-03-03 DIAGNOSIS — I10 Essential (primary) hypertension: Secondary | ICD-10-CM

## 2021-03-03 DIAGNOSIS — M159 Polyosteoarthritis, unspecified: Secondary | ICD-10-CM

## 2021-03-03 DIAGNOSIS — Z139 Encounter for screening, unspecified: Secondary | ICD-10-CM

## 2021-03-03 NOTE — Assessment & Plan Note (Signed)
- 

## 2021-03-03 NOTE — Assessment & Plan Note (Addendum)
DASH diet and commitment to daily physical activity for a minimum of 30 minutes discussed and encouraged, as a part of hypertension management. The importance of attaining a healthy weight is also discussed.  BP/Weight 03/03/2021 02/25/2021 11/07/2020 10/07/2020 07/16/2020 06/05/2020 2/33/4356  Systolic BP 861 683 729 021 115 520 802  Diastolic BP 78 84 86 82 70 74 81  Wt. (Lbs) 167 - 174 175.8 176.8 174.6 175  BMI 28.67 - 29.87 30.18 31.32 30.93 30.04  well controlled .continue amlodipine 5 mg daily,  HTCZ 25 mg daily.  EGFR today

## 2021-03-03 NOTE — Assessment & Plan Note (Signed)
followed by otho

## 2021-03-03 NOTE — Assessment & Plan Note (Signed)
Under control. Takes gabapentin 300mg TID

## 2021-03-03 NOTE — Assessment & Plan Note (Signed)
Continue fosamax, caltrate600+ vitamin D. Prevent falls

## 2021-03-03 NOTE — Progress Notes (Signed)
° °  Alexis Davis     MRN: [id number]      DOB: 1940-06-14   HPI Alexis Davis is here to establish care. Previous pt of Alexis Davis.   See Alexis Davis for chronic hip pain, chronic right shoulder  COPD: uses  breo daily, albuterol as needed   HTN Takes amlodipine and HTCZ..   Osteoporosis:  takes fosamax, calcium carbonate with vitamin D   Arthritis:followed by otho, takes meloxicam, norco, tramadol as needed. Recently had hip joint injection   She hs not taken her bp med today was rushing to get up here.   Her ears feels like they are stucked up, sometimes have pain but no trouble hearing, no drainage  She has never had flu vaccine, need to get COVID booster, shingles vaccine and TDAP vaccine. Pt educated on the need to gt required vaccines completed, she verbalized understanding    ROS Denies recent fever or chills. Denies sinus pressure, nasal congestion, ear pain or sore throat. Denies chest congestion, productive cough or wheezing. Denies chest pains, palpitations and leg swelling Denies abdominal pain, nausea, vomiting,diarrhea or constipation.   Denies dysuria, frequency, hesitancy or incontinence. Denies joint pain, swelling and limitation in mobility. Denies headaches, seizures, numbness, or tingling. Denies depression, anxiety or insomnia. Denies skin break down or rash.   PE  BP 138/78 (BP Location: Left Arm, Cuff Size: Normal)    Pulse 68    Ht 5\' 4"  (1.626 m)    Wt 167 lb (75.8 kg)    SpO2 96%    BMI 28.67 kg/m   Patient alert and oriented and in no cardiopulmonary distress.  HEENT: No facial asymmetry, EOMI,     Neck supple . Bilateral TM appers normal, no drainage noted in bilateral ear canal.  Chest: Clear to auscultation bilaterally.  CVS: S1, S2 no murmurs, no S3.Regular rate.  ABD: Soft non tender.   Ext: No edema  MS: Adequate ROM spine, shoulders, hips and knees.  Skin: Intact, no ulcerations or rash noted.  Psych: Good eye contact,  normal affect. Memory intact not anxious or depressed appearing.  CNS: CN 2-12 intact, power,  normal throughout.no focal deficits noted.   Assessment & Plan

## 2021-03-03 NOTE — Assessment & Plan Note (Signed)
Condition well controlled. Uses Flonase nasal spray daily.

## 2021-03-03 NOTE — Patient Instructions (Signed)
Please get your labs done today. Get your shingles vaccine, TDAP vaccine and COVID booster done at your pharmacy  It is important that you exercise regularly at least 30 minutes 5 times a week.  Think about what you will eat, plan ahead. Choose " clean, green, fresh or frozen" over canned, processed or packaged foods which are more sugary, salty and fatty. 70 to 75% of food eaten should be vegetables and fruit. Three meals at set times with snacks allowed between meals, but they must be fruit or vegetables. Aim to eat over a 12 hour period , example 7 am to 7 pm, and STOP after  your last meal of the day. Drink water,generally about 64 ounces per day, no other drink is as healthy. Fruit juice is best enjoyed in a healthy way, by EATING the fruit.  Thanks for choosing [facility], we consider it a privelige to serve you.

## 2021-03-03 NOTE — Assessment & Plan Note (Addendum)
condition well controlled.Takes breo daily,  albuterol PRN

## 2021-03-14 ENCOUNTER — Other Ambulatory Visit: Payer: Self-pay | Admitting: Nurse Practitioner

## 2021-03-14 DIAGNOSIS — E559 Vitamin D deficiency, unspecified: Secondary | ICD-10-CM

## 2021-03-14 LAB — CMP14+EGFR
ALT: 19 IU/L (ref 0–32)
AST: 23 IU/L (ref 0–40)
Albumin/Globulin Ratio: 1.6 (ref 1.2–2.2)
Albumin: 3.9 g/dL (ref 3.6–4.6)
Alkaline Phosphatase: 88 IU/L (ref 44–121)
BUN/Creatinine Ratio: 25 (ref 12–28)
BUN: 15 mg/dL (ref 8–27)
Bilirubin Total: 0.3 mg/dL (ref 0.0–1.2)
CO2: 24 mmol/L (ref 20–29)
Calcium: 9.6 mg/dL (ref 8.7–10.3)
Chloride: 104 mmol/L (ref 96–106)
Creatinine, Ser: 0.59 mg/dL (ref 0.57–1.00)
Globulin, Total: 2.5 g/dL (ref 1.5–4.5)
Glucose: 87 mg/dL (ref 70–99)
Potassium: 5.3 mmol/L — ABNORMAL HIGH (ref 3.5–5.2)
Sodium: 143 mmol/L (ref 134–144)
Total Protein: 6.4 g/dL (ref 6.0–8.5)
eGFR: 90 mL/min/{1.73_m2} (ref >59)

## 2021-03-14 LAB — LIPID PANEL
Chol/HDL Ratio: 3.1 ratio (ref 0.0–4.4)
Cholesterol, Total: 157 mg/dL (ref 100–199)
HDL: 51 mg/dL (ref >39)
LDL Chol Calc (NIH): 88 mg/dL (ref 0–99)
Triglycerides: 97 mg/dL (ref 0–149)
VLDL Cholesterol Cal: 18 mg/dL (ref 5–40)

## 2021-03-14 LAB — CBC
Hematocrit: 43.5 % (ref 34.0–46.6)
Hemoglobin: 14.5 g/dL (ref 11.1–15.9)
MCH: 30.9 pg (ref 26.6–33.0)
MCHC: 33.3 g/dL (ref 31.5–35.7)
MCV: 93 fL (ref 79–97)
Platelets: 213 10*3/uL (ref 150–450)
RBC: 4.7 x10E6/uL (ref 3.77–5.28)
RDW: 12.5 % (ref 11.7–15.4)
WBC: 12.1 10*3/uL — ABNORMAL HIGH (ref 3.4–10.8)

## 2021-03-14 LAB — VITAMIN D 25 HYDROXY (VIT D DEFICIENCY, FRACTURES): Vit D, 25-Hydroxy: 28.6 ng/mL — ABNORMAL LOW (ref 30.0–100.0)

## 2021-03-14 MED ORDER — VITAMIN D3 25 MCG (1000 UT) PO CAPS: 1000.0000 [IU] | ORAL_CAPSULE | Freq: Every day | ORAL | 3 refills | Status: AC

## 2021-03-24 ENCOUNTER — Encounter: Payer: Self-pay | Admitting: Orthopedic Surgery

## 2021-03-24 ENCOUNTER — Ambulatory Visit (INDEPENDENT_AMBULATORY_CARE_PROVIDER_SITE_OTHER): Payer: Medicare Other | Admitting: Orthopedic Surgery

## 2021-03-24 ENCOUNTER — Other Ambulatory Visit: Payer: Self-pay

## 2021-03-24 DIAGNOSIS — M1612 Unilateral primary osteoarthritis, left hip: Secondary | ICD-10-CM | POA: Diagnosis not present

## 2021-03-24 DIAGNOSIS — G8929 Other chronic pain: Secondary | ICD-10-CM

## 2021-03-24 DIAGNOSIS — M545 Low back pain, unspecified: Secondary | ICD-10-CM

## 2021-03-24 MED ORDER — TRAMADOL HCL 50 MG PO TABS: 50.0000 mg | ORAL_TABLET | Freq: Four times a day (QID) | ORAL | 0 refills | Status: DC | PRN

## 2021-03-24 MED ORDER — MELOXICAM 7.5 MG PO TABS: 7.5000 mg | ORAL_TABLET | Freq: Every day | ORAL | 5 refills | Status: DC

## 2021-03-24 NOTE — Progress Notes (Signed)
FOLLOW UP   Encounter Diagnoses  Name Primary?   Arthritis of left hip Yes   Chronic left-sided low back pain without sciatica      Chief Complaint  Patient presents with   Hip Problem    S/p Left hip fluro injection, still painful more like a cramp     Alexis Davis had the injection in her left hip and she got good pain relief although she is also having some lower back pain.  She asked me to look at the injection site because it was a little sore  She asked for something for pain for that as well as her back  She has been on tramadol and meloxicam in the past so we refilled  I checked her groin for the injection site and there was no redness swelling or signs of any drainage  Follow-up 6 months repeat hip x-ray  Meds ordered this encounter  Medications   traMADol (ULTRAM) 50 MG tablet    Sig: Take 1 tablet (50 mg total) by mouth every 6 (six) hours as needed.    Dispense:  30 tablet    Refill:  0   meloxicam (MOBIC) 7.5 MG tablet    Sig: Take 1 tablet (7.5 mg total) by mouth daily.    Dispense:  30 tablet    Refill:  5

## 2021-03-28 ENCOUNTER — Ambulatory Visit: Payer: Medicare Other | Admitting: Internal Medicine

## 2021-04-02 ENCOUNTER — Other Ambulatory Visit: Payer: Self-pay

## 2021-05-19 ENCOUNTER — Ambulatory Visit (INDEPENDENT_AMBULATORY_CARE_PROVIDER_SITE_OTHER): Payer: Medicare Other

## 2021-05-19 ENCOUNTER — Other Ambulatory Visit: Payer: Self-pay

## 2021-05-19 DIAGNOSIS — Z0000 Encounter for general adult medical examination without abnormal findings: Secondary | ICD-10-CM | POA: Diagnosis not present

## 2021-05-19 NOTE — Patient Instructions (Signed)
?  Alexis Davis , ?Thank you for taking time to come for your Medicare Wellness Visit. I appreciate your ongoing commitment to your health goals. Please review the following plan we discussed and let me know if I can assist you in the future.  ? ?These are the goals we discussed: ? Goals   ? ?  Patient Stated   ?  Patient states that she will work on getting covid boosters up to date ?  ?  Pharmacy Care Plan:   ?  CARE PLAN ENTRY ? ?Current Barriers:  ?Chronic Disease Management support, education, and care coordination needs related to Hypertension, COPD, and osteoarthritis. ?  ?Hypertension ?Pharmacist Clinical Goal(s): ?Over the next 180 days, patient will work with PharmD and providers to maintain BP goal <140/90 ?Current regimen:  ?Amlodipine '5mg'$  daily, HCTZ 25 mg daily ?Interventions: ?Initiate monitoring plan ?Comprehensive medication review ?Patient self care activities - Over the next 180 days, patient will: ?Purchase new BP monitor  ?Check BP once daily, document, and provide at future appointments ?Ensure daily salt intake < 2300 mg/day ? ?COPD ?Pharmacist Clinical Goal(s) ?Over the next 180 days, patient will work with PharmD and providers to optimize medication related to COPD ?Current regimen:  ?Albuterol 8mg inhaler prn ?Breo Ellipta 100-230m ?Interventions: ?Comprehensive medication review ?Continue current therapy ?Patient self care activities - Over the next 180 days, patient will: ?Continue to take medications as prescribed ?Contact PharmD or PCP with any increase in SOB. ? ?Osteoarthritis ?Pharmacist Clinical Goal(s) ?Over the next 180 days, patient will work with PharmD and providers to optimize medication related to pain/osteoarthritis. ?Current regimen:  ?Tramadol '50mg'$  every 8 hours as needed ?Interventions: ?Comprehensive medication review ?Continue current therapy ?Patient self care activities - Over the next 180 days, patient will: ?Continue to take medication as prescribed ?Contact PharmD  or PCP with any increase in pain symptoms or medication adverse effects ? ? ?Osteoporosis ?Pharmacist Clinical Goal(s) ?Over the next 180 days, patient will work with PharmD and providers to optimize meds and minimize symptoms of Osteoporosis. ?Current regimen:  ?Alendronate '70mg'$  weekly ?Interventions: ?Recommended repeat bone density scan ?Counseled on appropriate use of med ?Patient self care activities - Over the next 180 days, patient will: ?Continue to take medication as directed ?[facility] ? ?Please see past updates related to this goal by clicking on the "Past Updates" button in the selected goal  ? ?  ? ?  ?  ?This is a list of the screening recommended for you and due dates:  ?Health Maintenance  ?Topic Date Due  ? COVID-19 Vaccine (3 - Booster for Moderna series) 05/30/2021*  ? Zoster (Shingles) Vaccine (1 of 2) 05/30/2021*  ? Tetanus Vaccine  07/04/2028*  ? Pneumonia Vaccine  Completed  ? DEXA scan (bone density measurement)  Completed  ? HPV Vaccine  Aged Out  ? Flu Shot  Discontinued  ?*Topic was postponed. The date shown is not the original due date.  ?  ?

## 2021-05-19 NOTE — Progress Notes (Signed)
? ?Subjective:  ? Alexis Alexis Davis is a 81 y.o. female who presents for Medicare Annual (Subsequent) preventive examination. ?I connected with  Alexis Alexis Davis on 05/19/21 by a audio enabled telemedicine application and verified that I am speaking with the correct person using two identifiers. ? ?Patient Location: Home ? ?Provider Location: Office/Clinic ? ?I discussed the limitations of evaluation and management by telemedicine. The patient expressed understanding and agreed to proceed.  ?Review of Systems    ? ?  ? ?   ?Objective:  ?  ?There were no vitals filed for this visit. ?There is no height or weight on file to calculate BMI. ? ?Advanced Directives 01/17/2019 08/23/2014 08/20/2014  ?Does Patient Have a Medical Advance Directive? No No No  ?Would patient like information on creating a medical advance directive? Yes (MAU/Ambulatory/Procedural Areas - Information given) No - patient declined information No - patient declined information  ? ? ?Current Medications (verified) ?Outpatient Encounter Medications as of 05/19/2021  ?Medication Sig  ? albuterol (VENTOLIN HFA) 108 (90 Base) MCG/ACT inhaler Inhale 2 puffs into the lungs every 4 (four) hours as needed for wheezing or shortness of breath.  ? alendronate (FOSAMAX) 70 MG tablet Take with a full glass of water on Alexis Davis empty stomach.  ? amLODipine (NORVASC) 5 MG tablet Take 1 tablet (5 mg total) by mouth daily.  ? Calcium Carbonate-Vitamin D (CALTRATE 600+D PO) Take 1 tablet by mouth 2 (two) times daily.  ? Cholecalciferol (VITAMIN D3) 25 MCG (1000 UT) CAPS Take 1 capsule (1,000 Units total) by mouth daily.  ? clotrimazole-betamethasone (LOTRISONE) cream Apply 1 application topically 2 (two) times daily.  ? COD LIVER OIL PO Take by mouth. 4 tablets daily  ? fluticasone (FLONASE) 50 MCG/ACT nasal spray Place 2 sprays into both nostrils daily.  ? fluticasone furoate-vilanterol (BREO ELLIPTA) 100-25 MCG/INH AEPB Inhale 1 puff into the lungs daily.  ?  gabapentin (NEURONTIN) 300 MG capsule Take 1 capsule (300 mg total) by mouth 3 (three) times daily.  ? hydrochlorothiazide (HYDRODIURIL) 25 MG tablet Take 1 tablet (25 mg total) by mouth daily.  ? HYDROcodone-acetaminophen (NORCO) 7.5-325 MG tablet Take 1 tablet by mouth every 4 (four) hours as needed for moderate pain.  ? ipratropium (ATROVENT) 0.03 % nasal spray Place 2 sprays into both nostrils 2 (two) times daily as needed for rhinitis.  ? loratadine (CLARITIN) 10 MG tablet Take 1 tablet (10 mg total) by mouth daily. For allergies  ? Magnesium 250 MG TABS Take 1 tablet (250 mg total) by mouth daily.  ? meloxicam (MOBIC) 7.5 MG tablet Take 1 tablet (7.5 mg total) by mouth daily.  ? traMADol (ULTRAM) 50 MG tablet Take 1 tablet (50 mg total) by mouth every 6 (six) hours as needed.  ? ?No facility-administered encounter medications on file as of 05/19/2021.  ? ? ?Allergies (verified) ?Patient has no known allergies.  ? ?History: ?Past Medical History:  ?Diagnosis Date  ? Arthritis   ? Shoulder  ? Compression fracture of L4 lumbar vertebra   ? Pt did not have pain, osteoporosis  ? COPD (chronic obstructive pulmonary disease) ([place]) 2010  ? PFT  ? Hepatic cyst   ? Hypertension   ? Osteoporosis   ? ?Past Surgical History:  ?Procedure Laterality Date  ? ABDOMINAL HYSTERECTOMY    ? CATARACT EXTRACTION W/PHACO Right 08/23/2014  ? Procedure: CATARACT EXTRACTION PHACO AND INTRAOCULAR LENS PLACEMENT RIGHT EYE; CDE:  10.80;  Surgeon: Tonny Branch, MD;  Location: [facility];  Service: Ophthalmology;  Laterality: Right;  ? CATARACT EXTRACTION W/PHACO Left 10/08/2014  ? Procedure: CATARACT EXTRACTION PHACO AND INTRAOCULAR LENS PLACEMENT (IOC);  Surgeon: Tonny Branch, MD;  Location: [facility];  Service: Ophthalmology;  Laterality: Left;  CDE 6.60  ? ?Family History  ?Problem Relation Age of Onset  ? Diabetes Mother   ? Heart disease Father   ? Hypertension Sister   ? Hypertension Sister   ? Hypertension Sister   ? Hypertension Brother   ?  Diabetes Mellitus II Son   ? Arthritis Other   ? Breast cancer Neg Hx   ? Colon cancer Neg Hx   ? Cancer - Lung Neg Hx   ? ?Social History  ? ?Socioeconomic History  ? Marital status: Married  ?  Spouse name: Not on file  ? Number of children: Not on file  ? Years of education: 85  ? Highest education level: Not on file  ?Occupational History  ? Not on file  ?Tobacco Use  ? Smoking status: Former  ?  Types: Cigarettes  ? Smokeless tobacco: Never  ? Tobacco comments:  ?  She quit smoking in January 2022. Smoked for 20 years, a pack usually last her a week.  ?Vaping Use  ? Vaping Use: Never used  ?Substance and Sexual Activity  ? Alcohol use: No  ? Drug use: No  ? Sexual activity: Yes  ?Other Topics Concern  ? Not on file  ?Social History Narrative  ? Lives at home with her husband, has 3 children, pt is retired.  ? ?Social Determinants of Health  ? ?Financial Resource Strain: Not on file  ?Food Insecurity: Not on file  ?Transportation Needs: Not on file  ?Physical Activity: Not on file  ?Stress: Not on file  ?Social Connections: Not on file  ? ? ?Tobacco Counseling ?Counseling given: Not Answered ?Tobacco comments: She quit smoking in January 2022. Smoked for 20 years, a pack usually last her a week. ? ? ?Clinical Intake: ? ?  ? ?  ? ?  ? ?  ? ?  ? ?Diabetic?no ? ?  ? ?  ? ? ?Activities of Daily Living ?In your present state of health, do you have any difficulty performing the following activities: 07/16/2020  ?Hearing? N  ?Vision? Y  ?Difficulty concentrating or making decisions? N  ?Walking or climbing stairs? N  ?Dressing or bathing? N  ?Doing errands, shopping? N  ?Some recent data might be hidden  ? ? ?Patient Care Team: ?Renee Rival, FNP as PCP - General (Nurse Practitioner) ?Edythe Clarity, [facility] as Pharmacist (Pharmacist) ? ?Indicate any recent Medical Services you may have received from other than [facility] providers in the past year (date may be approximate). ? ?   ?Assessment:  ? This is a routine  wellness examination for [facility]. ? ?Hearing/Vision screen ?No results found. ? ?Dietary issues and exercise activities discussed: ?  ? ? Goals Addressed   ?None ?  ? ?Depression Screen ?PHQ 2/9 Scores 03/03/2021 06/05/2020 01/17/2019 12/24/2017 11/24/2017 03/29/2017 07/17/2016  ?PHQ - 2 Score 0 0 0 0 0 0 0  ?PHQ- 9 Score - 0 - - - 0 0  ?  ?Fall Risk ?Fall Risk  03/03/2021 06/05/2020 01/17/2019 12/24/2017 11/24/2017  ?Falls in the past year? 0 0 0 No No  ?Number falls in past yr: 0 - - - -  ?Injury with Fall? 0 - - - -  ?Risk for fall due to : No Fall Risks - - - -  ?  Follow up Falls evaluation completed - Falls evaluation completed - -  ? ? ?FALL RISK PREVENTION PERTAINING TO THE HOME: ? ?Any stairs in or around the home? Yes  ?If so, are there any without handrails? Yes  ?Home free of loose throw rugs in walkways, pet beds, electrical cords, etc? Yes  ?Adequate lighting in your home to reduce risk of falls? Yes  ? ?ASSISTIVE DEVICES UTILIZED TO PREVENT FALLS: ? ?Life alert? No  ?Use of a cane, walker or w/c? No  ?Grab bars in the bathroom? Yes  ?Shower chair or bench in shower? Yes  ?Elevated toilet seat or a handicapped toilet? Yes  ? ?TIMED UP AND GO: ? ?Was the test performed? No .  ?Length of time to ambulate 10 feet:  sec.  ? ? ? ?Cognitive Function: ?  ?  ?  ? ?Immunizations ?Immunization History  ?Administered Date(s) Administered  ? Influenza,inj,Quad PF,6+ Mos 12/09/2012  ? Moderna SARS-COV2 Booster Vaccination 01/30/2020  ? Moderna Sars-Covid-2 Vaccination 04/28/2019, 05/26/2019  ? Pneumococcal Conjugate-13 06/09/2013  ? Pneumococcal Polysaccharide-23 04/23/2005  ? ? ?TDAP status: Due, Education has been provided regarding the importance of this vaccine. Advised may receive this vaccine at local pharmacy or Health Dept. Aware to provide a copy of the vaccination record if obtained from local pharmacy or Health Dept. Verbalized acceptance and understanding. ? ?Flu Vaccine status: Declined, Education has been  provided regarding the importance of this vaccine but patient still declined. Advised may receive this vaccine at local pharmacy or Health Dept. Aware to provide a copy of the vaccination record if obtained from local pharmacy

## 2021-06-11 DIAGNOSIS — J209 Acute bronchitis, unspecified: Secondary | ICD-10-CM | POA: Diagnosis not present

## 2021-07-01 ENCOUNTER — Encounter: Payer: Self-pay | Admitting: Nurse Practitioner

## 2021-07-01 ENCOUNTER — Ambulatory Visit (INDEPENDENT_AMBULATORY_CARE_PROVIDER_SITE_OTHER): Payer: Medicare Other | Admitting: Nurse Practitioner

## 2021-07-01 VITALS — BP 170/86 | HR 58 | Ht 64.0 in | Wt 170.0 lb

## 2021-07-01 DIAGNOSIS — D229 Melanocytic nevi, unspecified: Secondary | ICD-10-CM | POA: Insufficient documentation

## 2021-07-01 DIAGNOSIS — Z131 Encounter for screening for diabetes mellitus: Secondary | ICD-10-CM | POA: Diagnosis not present

## 2021-07-01 DIAGNOSIS — E559 Vitamin D deficiency, unspecified: Secondary | ICD-10-CM

## 2021-07-01 DIAGNOSIS — I10 Essential (primary) hypertension: Secondary | ICD-10-CM

## 2021-07-01 DIAGNOSIS — Z0001 Encounter for general adult medical examination with abnormal findings: Secondary | ICD-10-CM | POA: Diagnosis not present

## 2021-07-01 DIAGNOSIS — Z0000 Encounter for general adult medical examination without abnormal findings: Secondary | ICD-10-CM

## 2021-07-01 MED ORDER — ALENDRONATE SODIUM 70 MG PO TABS: ORAL_TABLET | ORAL | 6 refills | Status: DC

## 2021-07-01 MED ORDER — AMLODIPINE BESYLATE 5 MG PO TABS: 5.0000 mg | ORAL_TABLET | Freq: Every day | ORAL | 3 refills | Status: DC

## 2021-07-01 MED ORDER — HYDROCHLOROTHIAZIDE 25 MG PO TABS: 25.0000 mg | ORAL_TABLET | Freq: Every day | ORAL | 2 refills | Status: DC

## 2021-07-01 NOTE — Assessment & Plan Note (Signed)
Annual exam as documented.  ?Counseling done include healthy lifestyle involving committing to 150 minutes of exercise per week, heart healthy diet, and attaining healthy weight. The importance of adequate sleep also discussed.  ?Regular use of seat belt and home safety were also discussed . ?Changes in health habits are decided on by patient with goals and time frames set for achieving them. ?Immunization  specifically addressed at this visit.   ?

## 2021-07-01 NOTE — Assessment & Plan Note (Signed)
On vitamin D 1000 units daily ?Check vitamin D labs today ?

## 2021-07-01 NOTE — Assessment & Plan Note (Signed)
BROWN colored mole Under left arm,  ?No changes in color and size of mole per patient ?Will monitor ?

## 2021-07-01 NOTE — Patient Instructions (Addendum)
Please get your shingles vaccine, COVID booster and TDAP vaccine at your pharmacy.  ? ?It is important that you exercise regularly at least 30 minutes 5 times a week.  ?Think about what you will eat, plan ahead. ?Choose " clean, green, fresh or frozen" over canned, processed or packaged foods which are more sugary, salty and fatty. ?70 to 75% of food eaten should be vegetables and fruit. ?Three meals at set times with snacks allowed between meals, but they must be fruit or vegetables. ?Aim to eat over a 12 hour period , example 7 am to 7 pm, and STOP after  your last meal of the day. ?Drink water,generally about 64 ounces per day, no other drink is as healthy. Fruit juice is best enjoyed in a healthy way, by EATING the fruit. ? ?Thanks for choosing [facility], we consider it a privelige to serve you.  ?

## 2021-07-01 NOTE — Assessment & Plan Note (Signed)
BP Readings from Last 3 Encounters:  ?07/01/21 (!) 170/86  ?03/03/21 138/78  ?02/25/21 (!) 165/84  ?On amlodipine 5 mg daily, hydrochlorothiazide 25 mg daily ?She ran out of medications 2 weeks ago ?Medications refilled today ?DASH diet advised engage in regular daily exercise at least 150 minutes weekly as tolerated ?Monitor blood pressure at home goal is less than 140/90 ?Follow-up in 4 weeks to recheck blood pressure ?CMP today ?

## 2021-07-01 NOTE — Progress Notes (Signed)
? ?Complete physical exam ? ?Patient: Alexis Davis   DOB: 02/23/1941   81 y.o. Female  MRN: [id number] ? ?Subjective:  ?  ?Chief Complaint  ?Patient presents with  ? Annual Exam  ?  cpe  ? ? ?Alexis Davis is a 81 y.o. female who presents today for a complete physical exam. She reports consuming a low fat and low sodium diet. She goes to the store and walks an hour daily, She generally feels well. She reports sleeping well. She does not have additional problems to discuss today.  ? ? ?States that she has been out of her hydrochlorothiazide and amlodipine for about 2 weeks now. Pt denies HA, CP, dizziness, edema. Pt encouraged to call her pharmacy for med refills  or call the office before running out of her medications.  ? ?Has had her eye exam within the past one year , wears prescription glasses.  ? ?States that she will schedule a dental exam visit soon, states that she wants her teeth pulled so she can get a partial denture. Pt denies tooth ache, pain , ulcer. ? ? ? ?Most recent fall risk assessment: ? ?  07/01/2021  ?  9:02 AM  ?Fall Risk   ?Falls in the past year? 0  ?Number falls in past yr: 0  ?Injury with Fall? 0  ?Risk for fall due to : History of fall(s)  ?Follow up Falls evaluation completed  ? ?  ?Most recent depression screenings: ? ?  07/01/2021  ?  9:02 AM 05/19/2021  ?  3:43 PM  ?PHQ 2/9 Scores  ?PHQ - 2 Score 0 0  ? ? ? ? ? ? ?Patient Care Team: ?Renee Rival, FNP as PCP - General (Nurse Practitioner) ?Edythe Clarity, [facility] as Pharmacist (Pharmacist)  ? ?Outpatient Medications Prior to Visit  ?Medication Sig  ? albuterol (VENTOLIN HFA) 108 (90 Base) MCG/ACT inhaler Inhale 2 puffs into the lungs every 4 (four) hours as needed for wheezing or shortness of breath.  ? Calcium Carbonate-Vitamin D (CALTRATE 600+D PO) Take 1 tablet by mouth 2 (two) times daily.  ? Cholecalciferol (VITAMIN D3) 25 MCG (1000 UT) CAPS Take 1 capsule (1,000 Units total) by mouth daily.  ?  clotrimazole-betamethasone (LOTRISONE) cream Apply 1 application topically 2 (two) times daily.  ? COD LIVER OIL PO Take by mouth. 4 tablets daily  ? fluticasone (FLONASE) 50 MCG/ACT nasal spray Place 2 sprays into both nostrils daily.  ? fluticasone furoate-vilanterol (BREO ELLIPTA) 100-25 MCG/INH AEPB Inhale 1 puff into the lungs daily.  ? gabapentin (NEURONTIN) 300 MG capsule Take 1 capsule (300 mg total) by mouth 3 (three) times daily.  ? HYDROcodone-acetaminophen (NORCO) 7.5-325 MG tablet Take 1 tablet by mouth every 4 (four) hours as needed for moderate pain.  ? ipratropium (ATROVENT) 0.03 % nasal spray Place 2 sprays into both nostrils 2 (two) times daily as needed for rhinitis.  ? loratadine (CLARITIN) 10 MG tablet Take 1 tablet (10 mg total) by mouth daily. For allergies  ? Magnesium 250 MG TABS Take 1 tablet (250 mg total) by mouth daily.  ? meloxicam (MOBIC) 7.5 MG tablet Take 1 tablet (7.5 mg total) by mouth daily.  ? [DISCONTINUED] alendronate (FOSAMAX) 70 MG tablet Take with a full glass of water on an empty stomach.  ? traMADol (ULTRAM) 50 MG tablet Take 1 tablet (50 mg total) by mouth every 6 (six) hours as needed. (Patient not taking: Reported on 07/01/2021)  ? [DISCONTINUED] amLODipine (NORVASC)  5 MG tablet Take 1 tablet (5 mg total) by mouth daily. (Patient not taking: Reported on 07/01/2021)  ? [DISCONTINUED] cefdinir (OMNICEF) 300 MG capsule Take 300 mg by mouth 2 (two) times daily. (Patient not taking: Reported on 07/01/2021)  ? [DISCONTINUED] hydrochlorothiazide (HYDRODIURIL) 25 MG tablet Take 1 tablet (25 mg total) by mouth daily. (Patient not taking: Reported on 07/01/2021)  ? ?No facility-administered medications prior to visit.  ? ? ?Review of Systems  ?Constitutional: Negative.   ?HENT: Negative.    ?Eyes: Negative.   ?Respiratory: Negative.    ?Cardiovascular: Negative.   ?Gastrointestinal: Negative.   ?Genitourinary: Negative.   ?Musculoskeletal: Negative.   ?Skin: Negative.   ?Neurological:  Negative.   ?Endo/Heme/Allergies: Negative.   ?Psychiatric/Behavioral: Negative.    ? ? ? ? ?   ?Objective:  ? ?  ?BP (!) 170/86   Pulse (!) 58   Ht '5\' 4"'$  (1.626 m)   Wt 170 lb (77.1 kg)   SpO2 99%   BMI 29.18 kg/m?  ? ?[place] CMA present as chaperone ?Physical Exam ?Constitutional:   ?   General: She is not in acute distress. ?   Appearance: She is obese. She is not ill-appearing, toxic-appearing or diaphoretic.  ?HENT:  ?   Head: Normocephalic and atraumatic.  ?   Comments: Missing teeth noted,  ?   Right Ear: Tympanic membrane, ear canal and external ear normal. There is no impacted cerumen.  ?   Left Ear: Tympanic membrane, ear canal and external ear normal. There is no impacted cerumen.  ?   Nose: No congestion or rhinorrhea.  ?   Mouth/Throat:  ?   Mouth: Mucous membranes are moist.  ?   Pharynx: Oropharynx is clear. No oropharyngeal exudate or posterior oropharyngeal erythema.  ?Eyes:  ?   General: No scleral icterus.    ?   Right eye: No discharge.     ?   Left eye: No discharge.  ?   Extraocular Movements: Extraocular movements intact.  ?   Conjunctiva/sclera: Conjunctivae normal.  ?   Pupils: Pupils are equal, round, and reactive to light.  ?Neck:  ?   Vascular: No carotid bruit.  ?Cardiovascular:  ?   Rate and Rhythm: Normal rate and regular rhythm.  ?   Pulses: Normal pulses.  ?   Heart sounds: Normal heart sounds. No murmur heard. ?  No friction rub. No gallop.  ?Pulmonary:  ?   Effort: Pulmonary effort is normal. No respiratory distress.  ?   Breath sounds: Normal breath sounds. No stridor. No wheezing, rhonchi or rales.  ?Chest:  ?   Chest wall: No mass, lacerations, deformity, swelling, tenderness, crepitus or edema.  ?Breasts: ?   Tanner Score is 5.  ?   Right: Normal. No swelling, bleeding, inverted nipple, mass, nipple discharge, skin change or tenderness.  ?   Left: Normal. No swelling, bleeding, inverted nipple, mass, nipple discharge, skin change or tenderness.  ?   Comments:  brownish  colored Mole under left arm.  ?Abdominal:  ?   General: There is no distension.  ?   Palpations: Abdomen is soft. There is no mass.  ?   Tenderness: There is no abdominal tenderness. There is no right CVA tenderness, left CVA tenderness, guarding or rebound.  ?   Hernia: No hernia is present.  ?Musculoskeletal:     ?   General: No swelling, tenderness, deformity or signs of injury. Normal range of motion.  ?   Cervical  back: Normal range of motion and neck supple. No rigidity or tenderness.  ?   Right lower leg: No edema.  ?   Left lower leg: No edema.  ?Lymphadenopathy:  ?   Cervical: No cervical adenopathy.  ?   Upper Body:  ?   Right upper body: No supraclavicular, axillary or pectoral adenopathy.  ?   Left upper body: No supraclavicular, axillary or pectoral adenopathy.  ?Skin: ?   Coloration: Skin is not jaundiced or pale.  ?   Findings: No bruising, erythema, lesion or rash.  ?Neurological:  ?   Mental Status: She is alert and oriented to person, place, and time.  ?   Cranial Nerves: No cranial nerve deficit.  ?   Sensory: No sensory deficit.  ?   Motor: No weakness.  ?   Coordination: Coordination normal.  ?   Gait: Gait normal.  ?   Deep Tendon Reflexes: Reflexes normal.  ?Psychiatric:     ?   Mood and Affect: Mood normal.     ?   Behavior: Behavior normal.     ?   Thought Content: Thought content normal.     ?   Judgment: Judgment normal.  ?  ? ?No results found for any visits on 07/01/21. ? ?   ?Assessment & Plan:  ?  ?Routine Health Maintenance and Physical Exam ? ?Immunization History  ?Administered Date(s) Administered  ? Influenza,inj,Quad PF,6+ Mos 12/09/2012  ? Moderna SARS-COV2 Booster Vaccination 01/30/2020  ? Moderna Sars-Covid-2 Vaccination 04/28/2019, 05/26/2019  ? Pneumococcal Conjugate-13 06/09/2013  ? Pneumococcal Polysaccharide-23 04/23/2005  ? ? ?Health Maintenance  ?Topic Date Due  ? Zoster Vaccines- Shingrix (1 of 2) Never done  ? COVID-19 Vaccine (3 - Booster for Moderna series)  08/03/2021 (Originally 03/26/2020)  ? TETANUS/TDAP  07/04/2028 (Originally 04/18/1959)  ? Pneumonia Vaccine 65+ Years old  Completed  ? DEXA SCAN  Completed  ? HPV VACCINES  Aged Out  ? INFLUENZA VACCINE  Discontinued  ? ? ?Discussed health b

## 2021-07-02 LAB — CMP14+EGFR
ALT: 10 IU/L (ref 0–32)
AST: 23 IU/L (ref 0–40)
Albumin/Globulin Ratio: 1.7 (ref 1.2–2.2)
Albumin: 4 g/dL (ref 3.6–4.6)
Alkaline Phosphatase: 104 IU/L (ref 44–121)
BUN/Creatinine Ratio: 17 (ref 12–28)
BUN: 11 mg/dL (ref 8–27)
Bilirubin Total: 0.3 mg/dL (ref 0.0–1.2)
CO2: 22 mmol/L (ref 20–29)
Calcium: 9.6 mg/dL (ref 8.7–10.3)
Chloride: 107 mmol/L — ABNORMAL HIGH (ref 96–106)
Creatinine, Ser: 0.63 mg/dL (ref 0.57–1.00)
Globulin, Total: 2.4 g/dL (ref 1.5–4.5)
Glucose: 90 mg/dL (ref 70–99)
Potassium: 5.3 mmol/L — ABNORMAL HIGH (ref 3.5–5.2)
Sodium: 143 mmol/L (ref 134–144)
Total Protein: 6.4 g/dL (ref 6.0–8.5)
eGFR: 89 mL/min/{1.73_m2} (ref >59)

## 2021-07-02 LAB — CBC WITH DIFFERENTIAL/PLATELET
Basophils Absolute: 0.1 10*3/uL (ref 0.0–0.2)
Basos: 1 %
EOS (ABSOLUTE): 0.1 10*3/uL (ref 0.0–0.4)
Eos: 1 %
Hematocrit: 41.4 % (ref 34.0–46.6)
Hemoglobin: 13.7 g/dL (ref 11.1–15.9)
Immature Grans (Abs): 0 10*3/uL (ref 0.0–0.1)
Immature Granulocytes: 0 %
Lymphocytes Absolute: 1.9 10*3/uL (ref 0.7–3.1)
Lymphs: 27 %
MCH: 30.3 pg (ref 26.6–33.0)
MCHC: 33.1 g/dL (ref 31.5–35.7)
MCV: 92 fL (ref 79–97)
Monocytes Absolute: 0.8 10*3/uL (ref 0.1–0.9)
Monocytes: 11 %
Neutrophils Absolute: 4.4 10*3/uL (ref 1.4–7.0)
Neutrophils: 60 %
Platelets: 229 10*3/uL (ref 150–450)
RBC: 4.52 x10E6/uL (ref 3.77–5.28)
RDW: 12.6 % (ref 11.7–15.4)
WBC: 7.2 10*3/uL (ref 3.4–10.8)

## 2021-07-02 LAB — TSH: TSH: 1.52 u[IU]/mL (ref 0.450–4.500)

## 2021-07-02 LAB — VITAMIN D 25 HYDROXY (VIT D DEFICIENCY, FRACTURES): Vit D, 25-Hydroxy: 33 ng/mL (ref 30.0–100.0)

## 2021-07-02 LAB — LIPID PANEL
Chol/HDL Ratio: 3 ratio (ref 0.0–4.4)
Cholesterol, Total: 167 mg/dL (ref 100–199)
HDL: 56 mg/dL (ref >39)
LDL Chol Calc (NIH): 96 mg/dL (ref 0–99)
Triglycerides: 77 mg/dL (ref 0–149)
VLDL Cholesterol Cal: 15 mg/dL (ref 5–40)

## 2021-07-02 LAB — MAGNESIUM: Magnesium: 2 mg/dL (ref 1.6–2.3)

## 2021-07-02 LAB — HEMOGLOBIN A1C
Est. average glucose Bld gHb Est-mCnc: 114 mg/dL
Hgb A1c MFr Bld: 5.6 % (ref 4.8–5.6)

## 2021-07-02 NOTE — Progress Notes (Signed)
Potassium level is slightly elevated, avoid supplement containing potassium, eat less banana.  ?Other labs are normal  ?Continue current meds.  ?Please call and schedule an appointment in 4 weeks to recheck her BP, BP was high at her visit yesterday , thanks

## 2021-09-22 ENCOUNTER — Encounter: Payer: Self-pay | Admitting: Orthopedic Surgery

## 2021-09-22 ENCOUNTER — Ambulatory Visit (INDEPENDENT_AMBULATORY_CARE_PROVIDER_SITE_OTHER): Payer: Medicare Other

## 2021-09-22 ENCOUNTER — Ambulatory Visit: Payer: Medicare Other | Admitting: Orthopedic Surgery

## 2021-09-22 DIAGNOSIS — M1612 Unilateral primary osteoarthritis, left hip: Secondary | ICD-10-CM

## 2021-09-22 DIAGNOSIS — M25552 Pain in left hip: Secondary | ICD-10-CM

## 2021-09-22 MED ORDER — TRAMADOL-ACETAMINOPHEN 37.5-325 MG PO TABS: 1.0000 | ORAL_TABLET | Freq: Four times a day (QID) | ORAL | 0 refills | Status: DC | PRN

## 2021-09-22 MED ORDER — TRAMADOL-ACETAMINOPHEN 37.5-325 MG PO TABS: 1.0000 | ORAL_TABLET | Freq: Four times a day (QID) | ORAL | 0 refills | Status: AC | PRN

## 2021-09-22 NOTE — Patient Instructions (Signed)
Prior to surgery you need a medical consult

## 2021-09-22 NOTE — Progress Notes (Signed)
norcoFOLLOW UP   Encounter Diagnosis  Name Primary?   Pain in left hip Yes     Chief Complaint  Patient presents with   Hip Pain    Left, a lot of pain feels like a cramp injections did not help   Alexis Davis has osteoarthritis of the left hip we sent her for left hip injection  She got worse  We took a new x-ray today and the x-ray shows progressive arthritis worse than the last x-ray  After discussing this with her I recommended that she have a left total hip arthroplasty but first she needs a preop medical consult  She wants to wait on having surgery until she gets her garden together and some other things she has to do so I will see her after preop consult and will pick a date for left total hip arthroplasty  Meds ordered this encounter  Medications   DISCONTD: traMADol-acetaminophen (ULTRACET) 37.5-325 MG tablet    Sig: Take 1 tablet by mouth every 6 (six) hours as needed for up to 7 days.    Dispense:  28 tablet    Refill:  0   traMADol-acetaminophen (ULTRACET) 37.5-325 MG tablet    Sig: Take 1 tablet by mouth every 6 (six) hours as needed for up to 7 days.    Dispense:  28 tablet    Refill:  0      Need pre-op medical consult

## 2021-09-25 ENCOUNTER — Telehealth: Payer: Self-pay

## 2021-09-25 NOTE — Telephone Encounter (Signed)
Called pt to schedule appt per fola to check bp before filling out surgery forms no answer no vm

## 2021-09-26 NOTE — Telephone Encounter (Signed)
Called pt no answer vm not set up 

## 2021-09-30 NOTE — Telephone Encounter (Signed)
Called pt no answer no vm 

## 2021-10-30 ENCOUNTER — Ambulatory Visit: Payer: Medicare Other | Admitting: Nurse Practitioner

## 2021-11-06 ENCOUNTER — Encounter: Payer: Self-pay | Admitting: Nurse Practitioner

## 2021-11-06 ENCOUNTER — Ambulatory Visit (INDEPENDENT_AMBULATORY_CARE_PROVIDER_SITE_OTHER): Payer: Medicare Other | Admitting: Nurse Practitioner

## 2021-11-06 VITALS — BP 150/78 | HR 63 | Ht 64.0 in | Wt 167.0 lb

## 2021-11-06 DIAGNOSIS — J449 Chronic obstructive pulmonary disease, unspecified: Secondary | ICD-10-CM | POA: Diagnosis not present

## 2021-11-06 DIAGNOSIS — I10 Essential (primary) hypertension: Secondary | ICD-10-CM | POA: Diagnosis not present

## 2021-11-06 DIAGNOSIS — R051 Acute cough: Secondary | ICD-10-CM | POA: Diagnosis not present

## 2021-11-06 DIAGNOSIS — M1611 Unilateral primary osteoarthritis, right hip: Secondary | ICD-10-CM | POA: Diagnosis not present

## 2021-11-06 MED ORDER — AMLODIPINE BESYLATE 2.5 MG PO TABS: 2.5000 mg | ORAL_TABLET | Freq: Every day | ORAL | 1 refills | Status: DC

## 2021-11-06 MED ORDER — UNABLE TO FIND: 0 refills | Status: DC

## 2021-11-06 MED ORDER — BENZONATATE 100 MG PO CAPS: 100.0000 mg | ORAL_CAPSULE | Freq: Two times a day (BID) | ORAL | 0 refills | Status: DC | PRN

## 2021-11-06 NOTE — Assessment & Plan Note (Signed)
BP Readings from Last 3 Encounters:  11/06/21 (!) 150/78  07/01/21 (!) 170/86  03/03/21 138/78  Chronic condition uncontrolled Currently on amlodipine 5 mg daily, hydrochlorothiazide 25 mg daily Start amlodipine 7.5 mg daily continue hydrochlorothiazide 25 mg daily.  Need to get blood pressure under control discussed with patient DASH diet advised Follow-up in 4 weeks

## 2021-11-06 NOTE — Patient Instructions (Addendum)
Please start taking amlodipine 2.'5mg'$  , add to your current dose of amlodipine '5mg'$  to make a total of amlodipine 7.'5mg'$  daily. Monitor your blood pressure daily , keep a log and bring to next visit   Nurse please order blood pressure monitor   Take tessalon '100mg'$  twice daily as needed for your cough    Please get your shingles and TDAP vaccines at the pharmacy    It is important that you exercise regularly at least 30 minutes 5 times a week as tolerated  Think about what you will eat, plan ahead. Choose " clean, green, fresh or frozen" over canned, processed or packaged foods which are more sugary, salty and fatty. 70 to 75% of food eaten should be vegetables and fruit. Three meals at set times with snacks allowed between meals, but they must be fruit or vegetables. Aim to eat over a 12 hour period , example 7 am to 7 pm, and STOP after  your last meal of the day. Drink water,generally about 64 ounces per day, no other drink is as healthy. Fruit juice is best enjoyed in a healthy way, by EATING the fruit.  Thanks for choosing [facility], we consider it a privelige to serve you.

## 2021-11-06 NOTE — Assessment & Plan Note (Signed)
May be related to her COPD She has taken over-the-counter Mucinex but not effective Started Tessalon 100 mg twice daily as needed Drink at least 64 ounces of water daily to stay hydrated

## 2021-11-06 NOTE — Progress Notes (Signed)
   Alexis Davis     MRN: [id number]      DOB: 13-Jan-1941   HPI Alexis Davis with past medical history of essential hypertension, osteoarthritis, COPD, peripheral polyneuropathy is here for follow up and re-evaluation of chronic medical conditions   Right hip osteoarthritis ,has upcoming hip surgery, she states that she is now ready to proceed with her surgery.  She has been taking tramadol and meloxicam as needed for her pain   Hypertension.  Currently on amlodipine 5 mg daily, hydrochlorothiazide 25 mg daily she has not been monitoring her BP at home, denies CP, dizziness, edema   COPD .patient stated that she has not needed her albuterol inhaler in a while she she has non productive  cough that's started 3-4 weeks ago, denies SOB, wheezing, fever, chills       ROS Denies recent fever or chills. Denies sinus pressure, nasal congestion, ear pain or sore throat. Denies chest pains, palpitations and leg swelling Denies abdominal pain, nausea, vomiting,diarrhea or constipation.   Denies dysuria, frequency, hesitancy or incontinence. Denies headaches, seizures, numbness, or tingling. Denies depression, anxiety or insomnia. Denies skin break down or rash.   PE  BP (!) 158/78 (BP Location: Left Arm, Cuff Size: Normal)   Pulse 63   Ht '5\' 4"'$  (1.626 m)   Wt 167 lb (75.8 kg)   SpO2 95%   BMI 28.67 kg/m   Patient alert and oriented and in no cardiopulmonary distress.  HEENT: No facial asymmetry, EOMI,     Neck supple .  Chest: Clear to auscultation bilaterally.  CVS: S1, S2 no murmurs, no S3.Regular rate.  ABD: Soft non tender.   Ext: No edema  MS: decreased ROM spine, shoulders, hips and knees  Psych: Good eye contact, normal affect. Memory intact not anxious or depressed appearing.    Assessment & Plan  COPD, mild ([place]) Denies SOB. Wheezing, not smoking currently  She has had albuterol inhaler ordered but she has not needed it in a while  ESSENTIAL  HYPERTENSION, BENIGN BP Readings from Last 3 Encounters:  11/06/21 (!) 150/78  07/01/21 (!) 170/86  03/03/21 138/78  Chronic condition uncontrolled Currently on amlodipine 5 mg daily, hydrochlorothiazide 25 mg daily Start amlodipine 7.5 mg daily continue hydrochlorothiazide 25 mg daily.  Need to get blood pressure under control discussed with patient DASH diet advised Follow-up in 4 weeks  Acute cough May be related to her COPD She has taken over-the-counter Mucinex but not effective Started Tessalon 100 mg twice daily as needed Drink at least 64 ounces of water daily to stay hydrated  OA (osteoarthritis) Chronic osteoarthritis of right hip Takes tramadol and meloxicam as needed Has plans to proceed with surgery Need to get her blood pressure under control prior to surgery discussed with patient.

## 2021-11-06 NOTE — Assessment & Plan Note (Addendum)
Denies SOB. Wheezing, not smoking currently  She has had albuterol inhaler ordered but she has not needed it in a while

## 2021-11-06 NOTE — Assessment & Plan Note (Signed)
Chronic osteoarthritis of right hip Takes tramadol and meloxicam as needed Has plans to proceed with surgery Need to get her blood pressure under control prior to surgery discussed with patient.

## 2021-11-10 ENCOUNTER — Other Ambulatory Visit: Payer: Self-pay

## 2021-11-10 MED ORDER — AMLODIPINE BESYLATE 5 MG PO TABS: 5.0000 mg | ORAL_TABLET | Freq: Every day | ORAL | 3 refills | Status: DC

## 2021-11-10 MED ORDER — AMLODIPINE BESYLATE 2.5 MG PO TABS: 2.5000 mg | ORAL_TABLET | Freq: Every day | ORAL | 1 refills | Status: DC

## 2021-11-11 ENCOUNTER — Telehealth: Payer: Self-pay

## 2021-11-11 NOTE — Telephone Encounter (Signed)
-----   Message from Johny Drilling, [place] sent at 11/11/2021  8:22 AM EDT ----- Can you call her back please? She came in yesterday asking for the clearance  ----- Message ----- From: Renee Rival, FNP Sent: 11/11/2021   8:09 AM EDT To: Jill Side, CMA; Johny Drilling, CMA  Please tell patient to take pain meds as needed and  call the othopedics office about her hip pain, it is important that we get her her BP under control before surgery. Also did we receive any paper work from [employer]  yet?  She has an upcoming appointment to follow up on her BP ----- Message ----- From: Johny Drilling, [place]: 11/10/2021   1:05 PM EDT To: Renee Rival, FNP  She came in the office and was asking to get her surgical clearance papers sent to dr. Aline Brochure, but I told her that per your note it said the BP needed to be under control first. Could you just touch base with her when you have a min. She said she is in a bunch of pain in that hip.

## 2021-11-11 NOTE — Telephone Encounter (Signed)
Spoke with pt she is due to follow up on htn 12/04/21 with Dr Doren Custard. Per Kristin Bruins she is not comfortable giving ok due to her bp. Pt is going to reach out to ortho and see if her bp would be a hinder for surgery

## 2021-11-20 ENCOUNTER — Ambulatory Visit: Payer: Medicare Other | Admitting: Orthopedic Surgery

## 2021-11-27 ENCOUNTER — Ambulatory Visit: Payer: Medicare Other | Admitting: Orthopaedic Surgery

## 2021-11-27 ENCOUNTER — Encounter: Payer: Self-pay | Admitting: Orthopaedic Surgery

## 2021-11-27 DIAGNOSIS — M1612 Unilateral primary osteoarthritis, left hip: Secondary | ICD-10-CM

## 2021-11-27 MED ORDER — TRAMADOL HCL 50 MG PO TABS: 50.0000 mg | ORAL_TABLET | Freq: Two times a day (BID) | ORAL | 0 refills | Status: DC | PRN

## 2021-11-27 NOTE — Progress Notes (Signed)
Office Visit Note   Patient: Alexis Davis           Date of Birth: [birth year]           MRN: [id number] Visit Date: 11/27/2021              Requested by: Johnette Abraham, MD [address],  [place] [address] PCP: Johnette Abraham, MD   Assessment & Plan: Visit Diagnoses:  1. Unilateral primary osteoarthritis, left hip     Plan: Patient would like to proceed with anterior approach left total hip arthroplasty.  We discussed spinal anesthesia overnight stay use of a walker.  Ultram 30 tablets sent and she can take 1 at night if needed with caution her to use this sparingly so that her postop pain medication works better.  Follow-Up Instructions: No follow-ups on file.   Orders:  No orders of the defined types were placed in this encounter.  Meds ordered this encounter  Medications   traMADol (ULTRAM) 50 MG tablet    Sig: Take 1 tablet (50 mg total) by mouth every 12 (twelve) hours as needed.    Dispense:  30 tablet    Refill:  0      Procedures: No procedures performed   Clinical Data: No additional findings.   Subjective: Chief Complaint  Patient presents with   Left Hip - Pain    HPI 81 year old female whose husband is my patient is seen with left hip pain worse for the last year.  She has to use a walker at home or a cane.  She has anterior groin pain and states it is severe and she needs to do something about it.  She had an injection in December 9 months ago and states that she got temporary relief for a week or 2 and then recurrence of pain symptoms.  Patient's been on medication for blood pressure possible Neurontin and use some tramadol for her hip pain.  No cardiac history negative history of CVA.  Patient lives with her husband.  She states her health has been good until this it became severe.  Previous cataract surgery 2016.  No history of diabetes.  A1c in May was 5.6.  Review of Systems all the systems noncontributory to  HPI.   Objective: Vital Signs: Ht '5\' 4"'$  (1.626 m)   Wt 167 lb (75.8 kg)   BMI 28.67 kg/m   Physical Exam Constitutional:      Appearance: She is well-developed.  HENT:     Head: Normocephalic.     Right Ear: External ear normal.     Left Ear: External ear normal. There is no impacted cerumen.  Eyes:     Pupils: Pupils are equal, round, and reactive to light.  Neck:     Thyroid: No thyromegaly.     Trachea: No tracheal deviation.  Cardiovascular:     Rate and Rhythm: Normal rate.  Pulmonary:     Effort: Pulmonary effort is normal.  Abdominal:     Palpations: Abdomen is soft.  Musculoskeletal:     Cervical back: No rigidity.  Skin:    General: Skin is warm and dry.  Neurological:     Mental Status: She is alert and oriented to person, place, and time.  Psychiatric:        Behavior: Behavior normal.     Ortho Exam patient has 0 degrees internal rotation left hip 20 degrees external rotation both with severe pain.  Positive Trendelenburg gait she has to use her cane for ambulation.  Of the right hip has 30 degrees internal/external rotation without pain knees reach full extension distal pulses are palpable no calf tenderness.  No sciatic notch tenderness.  Specialty Comments:  No specialty comments available.  Imaging:X-ray report hip x-rays 09/22/2021.  Chief complaint left hip pain, chronic  Images AP pelvis AP lateral left hip  Reading:   Both hips have arthritis the left much more severe than the right.  She has grade 4 arthritis on the left and grade 3 arthritis on the right she also has some lower lumbar degenerative disc disease the head neck angle is probably in the neighborhood of 132 degrees  There is bony contact between the superior weightbearing acetabular area and the femoral head, decreased head neck offset is also noted  Surrounding osteophytes    Impression: Severe arthritis left hip worse than previous x-ray    PMFS History: Patient Active  Problem List   Diagnosis Date Noted   Unilateral primary osteoarthritis, left hip 11/27/2021   Acute cough 11/06/2021   Annual physical exam 07/01/2021   Skin mole 07/01/2021   Seasonal allergies 07/19/2019   Peripheral polyneuropathy 11/24/2017   Vitamin D deficiency 06/22/2016   Varicose veins 03/16/2014   Tinea pedis 11/03/2013   OA (osteoarthritis) 11/03/2013   Osteoporosis 12/09/2012   Itching 12/09/2012   PTTD (posterior tibial tendon dysfunction) 09/01/2012   Neuropathy, leg 04/29/2012   Rotator cuff syndrome of right shoulder 11/26/2011   COPD, mild ([place]) 10/22/2008   ESSENTIAL HYPERTENSION, BENIGN 09/25/2008   Osteoarthritis of shoulder 06/12/2008   HEPATIC CYST 03/16/2006   DEGENERATION, DISC NOS 03/16/2006   Past Medical History:  Diagnosis Date   Arthritis    Shoulder   Compression fracture of L4 lumbar vertebra    Pt did not have pain, osteoporosis   COPD (chronic obstructive pulmonary disease) ([place]) 2010   PFT   Hepatic cyst    Hypertension    Osteoporosis     Family History  Problem Relation Age of Onset   Diabetes Mother    Heart disease Father    Hypertension Sister    Hypertension Sister    Hypertension Sister    Hypertension Brother    Diabetes Mellitus II Son    Arthritis Other    Breast cancer Neg Hx    Colon cancer Neg Hx    Cancer - Lung Neg Hx     Past Surgical History:  Procedure Laterality Date   ABDOMINAL HYSTERECTOMY     CATARACT EXTRACTION W/PHACO Right 08/23/2014   Procedure: CATARACT EXTRACTION PHACO AND INTRAOCULAR LENS PLACEMENT RIGHT EYE; CDE:  10.80;  Surgeon: Tonny Branch, MD;  Location: [facility];  Service: Ophthalmology;  Laterality: Right;   CATARACT EXTRACTION W/PHACO Left 10/08/2014   Procedure: CATARACT EXTRACTION PHACO AND INTRAOCULAR LENS PLACEMENT (IOC);  Surgeon: Tonny Branch, MD;  Location: [facility];  Service: Ophthalmology;  Laterality: Left;  CDE 6.60   Social History   Occupational History   Not on file  Tobacco Use    Smoking status: Former    Types: Cigarettes   Smokeless tobacco: Never   Tobacco comments:    She quit smoking in January 2022. Smoked for 20 years, a pack usually last her a week.  Vaping Use   Vaping Use: Never used  Substance and Sexual Activity   Alcohol use: No   Drug use: No   Sexual activity: Yes

## 2021-12-04 ENCOUNTER — Ambulatory Visit (INDEPENDENT_AMBULATORY_CARE_PROVIDER_SITE_OTHER): Payer: Medicare Other | Admitting: Internal Medicine

## 2021-12-04 ENCOUNTER — Encounter: Payer: Self-pay | Admitting: Internal Medicine

## 2021-12-04 DIAGNOSIS — I10 Essential (primary) hypertension: Secondary | ICD-10-CM

## 2021-12-04 NOTE — Patient Instructions (Signed)
It was a pleasure to see you today.  Thank you for giving [place] the opportunity to be involved in your care.  Below is a brief recap of your visit and next steps.  We will plan to see you again in 2 months.  Summary You blood pressure has improved. No changes today. Continue amlodipine 7.5 mg and HCTZ 25 mg  Follow up in 2 months. Good luck with your surgery.

## 2021-12-04 NOTE — Progress Notes (Signed)
Established Patient Office Visit  Subjective   Patient ID: Alexis Davis, female    DOB: 1941-01-02  Age: 81 y.o. MRN: [id number]  Chief Complaint  Patient presents with   Follow-up   [name] returns to care today.  She is an 81 year old woman with a past medical history significant for HTN, COPD, and osteoarthritis.  She was last seen at [facility] on 9/7 by Vena Rua, NP.  Amlodipine increased to 7.5 mg daily.  She returns today for HTN follow-up.  [name] states that she feels well.  She has no acute concerns and endorses chronic left hip pain.  She will undergo surgery soon.  She has not been checking her blood pressure at home.  BP today is 138/72.  She has been taking her antihypertensive medications as prescribed.  Past Medical History:  Diagnosis Date   Arthritis    Shoulder   Compression fracture of L4 lumbar vertebra    Pt did not have pain, osteoporosis   COPD (chronic obstructive pulmonary disease) ([employer]) 2010   PFT   Hepatic cyst    Hypertension    Osteoporosis    Past Surgical History:  Procedure Laterality Date   ABDOMINAL HYSTERECTOMY     CATARACT EXTRACTION W/PHACO Right 08/23/2014   Procedure: CATARACT EXTRACTION PHACO AND INTRAOCULAR LENS PLACEMENT RIGHT EYE; CDE:  10.80;  Surgeon: Tonny Branch, MD;  Location: [facility];  Service: Ophthalmology;  Laterality: Right;   CATARACT EXTRACTION W/PHACO Left 10/08/2014   Procedure: CATARACT EXTRACTION PHACO AND INTRAOCULAR LENS PLACEMENT (IOC);  Surgeon: Tonny Branch, MD;  Location: [facility];  Service: Ophthalmology;  Laterality: Left;  CDE 6.60   Social History   Tobacco Use   Smoking status: Former    Types: Cigarettes   Smokeless tobacco: Never   Tobacco comments:    She quit smoking in January 2022. Smoked for 20 years, a pack usually last her a week.  Vaping Use   Vaping Use: Never used  Substance Use Topics   Alcohol use: No   Drug use: No   Family History  Problem Relation Age of Onset   Diabetes  Mother    Heart disease Father    Hypertension Sister    Hypertension Sister    Hypertension Sister    Hypertension Brother    Diabetes Mellitus II Son    Arthritis Other    Breast cancer Neg Hx    Colon cancer Neg Hx    Cancer - Lung Neg Hx    No Known Allergies    Review of Systems  Musculoskeletal:  Positive for joint pain (Left hip pain).  All other systems reviewed and are negative.    Objective:     BP 138/72   Pulse 66   Ht 5' 4"  (1.626 m)   Wt 164 lb (74.4 kg)   SpO2 95%   BMI 28.15 kg/m  BP Readings from Last 3 Encounters:  12/04/21 138/72  11/06/21 (!) 150/78  07/01/21 (!) 170/86      Physical Exam Constitutional:      General: She is not in acute distress.    Appearance: Normal appearance. She is obese. She is not toxic-appearing.  HENT:     Head: Normocephalic and atraumatic.     Nose: Nose normal. No congestion or rhinorrhea.     Mouth/Throat:     Mouth: Mucous membranes are moist.     Pharynx: Oropharynx is clear. No oropharyngeal exudate or posterior oropharyngeal erythema.  Eyes:  General: No scleral icterus.    Extraocular Movements: Extraocular movements intact.     Conjunctiva/sclera: Conjunctivae normal.     Pupils: Pupils are equal, round, and reactive to light.  Neck:     Vascular: No carotid bruit.  Cardiovascular:     Rate and Rhythm: Normal rate and regular rhythm.     Pulses: Normal pulses.     Heart sounds: Normal heart sounds. No murmur heard.    No friction rub. No gallop.  Pulmonary:     Effort: Pulmonary effort is normal. No respiratory distress.     Breath sounds: Normal breath sounds. No wheezing, rhonchi or rales.  Abdominal:     General: Abdomen is flat. Bowel sounds are normal. There is no distension.     Palpations: Abdomen is soft.     Tenderness: There is no abdominal tenderness.  Musculoskeletal:     Cervical back: Normal range of motion.     Right lower leg: No edema.     Left lower leg: No edema.   Lymphadenopathy:     Cervical: No cervical adenopathy.  Skin:    General: Skin is warm and dry.     Coloration: Skin is not jaundiced.  Neurological:     General: No focal deficit present.     Mental Status: She is alert and oriented to person, place, and time.  Psychiatric:        Mood and Affect: Mood normal.        Behavior: Behavior normal.    Last CBC Lab Results  Component Value Date   WBC 7.2 07/01/2021   HGB 13.7 07/01/2021   HCT 41.4 07/01/2021   MCV 92 07/01/2021   MCH 30.3 07/01/2021   RDW 12.6 07/01/2021   PLT 229 82/50/0370   Last metabolic panel Lab Results  Component Value Date   GLUCOSE 90 07/01/2021   NA 143 07/01/2021   K 5.3 (H) 07/01/2021   CL 107 (H) 07/01/2021   CO2 22 07/01/2021   BUN 11 07/01/2021   CREATININE 0.63 07/01/2021   EGFR 89 07/01/2021   CALCIUM 9.6 07/01/2021   PROT 6.4 07/01/2021   ALBUMIN 4.0 07/01/2021   LABGLOB 2.4 07/01/2021   AGRATIO 1.7 07/01/2021   BILITOT 0.3 07/01/2021   ALKPHOS 104 07/01/2021   AST 23 07/01/2021   ALT 10 07/01/2021   ANIONGAP 8 08/20/2014   Last lipids Lab Results  Component Value Date   CHOL 167 07/01/2021   HDL 56 07/01/2021   LDLCALC 96 07/01/2021   TRIG 77 07/01/2021   CHOLHDL 3.0 07/01/2021   Last hemoglobin A1c Lab Results  Component Value Date   HGBA1C 5.6 07/01/2021   Last thyroid functions Lab Results  Component Value Date   TSH 1.520 07/01/2021   Last vitamin D Lab Results  Component Value Date   VD25OH 33.0 07/01/2021   Last vitamin B12 and Folate Lab Results  Component Value Date   VITAMINB12 449 07/19/2019   FOLATE 15.3 11/24/2017     Assessment & Plan:   Problem List Items Addressed This Visit       ESSENTIAL HYPERTENSION, BENIGN    BP 138/72 today.  Her current regimen consists of amlodipine 7.5 mg daily and HCTZ 25 mg daily.  She has not been monitoring her blood pressure at home.  BP today is significant improved from prior readings.  BP elevated at  150/78 on 9/7, prompting increase in amlodipine. -I discussed with Ms. Brasil that I would prefer  for her systolic pressure to be less than 130.  She attributes some of her pain today to left hip pain.  She will soon undergo surgery.  With this in mind, we will not make any medication changes for now.  It would be reasonable to increase amlodipine to 10 mg daily at follow-up.  No changes today.  Continue current regimen of amlodipine 7.5 mg daily and HCTZ 25 mg daily.  She will follow-up in 2 months.      Return in about 2 months (around 02/03/2022).   Johnette Abraham, MD

## 2021-12-05 ENCOUNTER — Other Ambulatory Visit: Payer: Self-pay

## 2021-12-08 ENCOUNTER — Encounter: Payer: Self-pay | Admitting: Internal Medicine

## 2021-12-08 NOTE — Assessment & Plan Note (Addendum)
BP 138/72 today.  Her current regimen consists of amlodipine 7.5 mg daily and HCTZ 25 mg daily.  She has not been monitoring her blood pressure at home.  BP today is significant improved from prior readings.  BP elevated at 150/78 on 9/7, prompting increase in amlodipine. -I discussed with Alexis Davis that I would prefer for her systolic pressure to be less than 130.  She attributes some of her pain today to left hip pain.  She will soon undergo surgery.  With this in mind, we will not make any medication changes for now.  It would be reasonable to increase amlodipine to 10 mg daily at follow-up.  No changes today.  Continue current regimen of amlodipine 7.5 mg daily and HCTZ 25 mg daily.  She will follow-up in 2 months.

## 2021-12-30 NOTE — Pre-Procedure Instructions (Signed)
Surgical Instructions    Your procedure is scheduled on Wednesday 01/07/22.   Report to [facility] Main Entrance "A" at 10:35 A.M., then check in with the Admitting office.  Call this number if you have problems the morning of surgery:  [phone]   If you have any questions prior to your surgery date call [phone]: Open Monday-Friday 8am-4pm If you experience any cold or flu symptoms such as cough, fever, chills, shortness of breath, etc. between now and your scheduled surgery, please notify us at the above number     Remember:  Do not eat after midnight the night before your surgery  You may drink clear liquids until 09:35 A.M. the morning of your surgery.   Clear liquids allowed are: Water, Non-Citrus Juices (without pulp), Carbonated Beverages, Clear Tea, Black Coffee ONLY (NO MILK, CREAM OR POWDERED CREAMER of any kind), and Gatorade    Take these medicines the morning of surgery with A SIP OF WATER:   amLODipine (NORVASC)   gabapentin (NEURONTIN)    Take these medicines if needed:   albuterol (VENTOLIN HFA)   benzonatate (TESSALON)  fluticasone (FLONASE)   fluticasone furoate-vilanterol (BREO ELLIPTA)   ipratropium (ATROVENT)   loratadine (CLARITIN)  traMADol (ULTRAM)  As of today, STOP taking any Aspirin (unless otherwise instructed by your surgeon) Aleve, Naproxen, Ibuprofen, Motrin, Advil, Goody's, BC's, all herbal medications, fish oil, meloxicam (MOBIC), and all vitamins.           Do not wear jewelry or makeup. Do not wear lotions, powders, perfumes/cologne or deodorant. Do not shave 48 hours prior to surgery.  Men may shave face and neck. Do not bring valuables to the hospital. Do not wear nail polish, gel polish, artificial nails, or any other type of covering on natural nails (fingers and toes) If you have artificial nails or gel coating that need to be removed by a nail salon, please have this removed prior to surgery. Artificial nails or gel coating may  interfere with anesthesia's ability to adequately monitor your vital signs.  [facility] is not responsible for any belongings or valuables.    Do NOT Smoke (Tobacco/Vaping)  24 hours prior to your procedure  If you use a CPAP at night, you may bring your mask for your overnight stay.   Contacts, glasses, hearing aids, dentures or partials may not be worn into surgery, please bring cases for these belongings   For patients admitted to the hospital, discharge time will be determined by your treatment team.   Patients discharged the day of surgery will not be allowed to drive home, and someone needs to stay with them for 24 hours.   SURGICAL WAITING ROOM VISITATION Patients having surgery or a procedure may have no more than 2 support people in the waiting area - these visitors may rotate.   Children under the age of 33 must have an adult with them who is not the patient. If the patient needs to stay at the hospital during part of their recovery, the visitor guidelines for inpatient rooms apply. Pre-op nurse will coordinate an appropriate time for 1 support person to accompany patient in pre-op.  This support person may not rotate.   Please refer to [email or web address] for the visitor guidelines for Inpatients (after your surgery is over and you are in a regular room).    Special instructions:    Oral Hygiene is also important to reduce your risk of infection.  Remember - BRUSH YOUR TEETH THE MORNING OF SURGERY  WITH YOUR REGULAR TOOTHPASTE   [place]- Preparing For Surgery  Before surgery, you can play an important role. Because skin is not sterile, your skin needs to be as free of germs as possible. You can reduce the number of germs on your skin by washing with CHG (chlorahexidine gluconate) Soap before surgery.  CHG is an antiseptic cleaner which kills germs and bonds with the skin to continue killing germs even after washing.      Please do not use if you have an allergy to CHG or antibacterial soaps. If your skin becomes reddened/irritated stop using the CHG.  Do not shave (including legs and underarms) for at least 48 hours prior to first CHG shower. It is OK to shave your face.  Please follow these instructions carefully.     Shower the NIGHT BEFORE SURGERY and the MORNING OF SURGERY with CHG Soap.   If you chose to wash your hair, wash your hair first as usual with your normal shampoo. After you shampoo, rinse your hair and body thoroughly to remove the shampoo.  Then [employer] and genitals (private parts) with your normal soap and rinse thoroughly to remove soap.  After that Use CHG Soap as you would any other liquid soap. You can apply CHG directly to the skin and wash gently with a scrungie or a clean washcloth.   Apply the CHG Soap to your body ONLY FROM THE NECK DOWN.  Do not use on open wounds or open sores. Avoid contact with your eyes, ears, mouth and genitals (private parts). Wash Face and genitals (private parts)  with your normal soap.   Wash thoroughly, paying special attention to the area where your surgery will be performed.  Thoroughly rinse your body with warm water from the neck down.  DO NOT shower/wash with your normal soap after using and rinsing off the CHG Soap.  Pat yourself dry with a CLEAN TOWEL.  Wear CLEAN PAJAMAS to bed the night before surgery  Place CLEAN SHEETS on your bed the night before your surgery  DO NOT SLEEP WITH PETS.   Day of Surgery:  Take a shower with CHG soap. Wear Clean/Comfortable clothing the morning of surgery Do not apply any deodorants/lotions.   Remember to brush your teeth WITH YOUR REGULAR TOOTHPASTE.    If you received a COVID test during your pre-op visit, it is requested that you wear a mask when out in public, stay away from anyone that may not be feeling well, and notify your surgeon if you develop symptoms. If you have been in contact  with anyone that has tested positive in the last 10 days, please notify your surgeon.    Please read over the following fact sheets that you were given.

## 2021-12-31 ENCOUNTER — Encounter (HOSPITAL_COMMUNITY)
Admission: RE | Admit: 2021-12-31 | Discharge: 2021-12-31 | Disposition: A | Discharge status: Home / Self Care | Payer: Medicare Other | Source: Ambulatory Visit | Attending: Orthopaedic Surgery | Admitting: Orthopaedic Surgery

## 2021-12-31 ENCOUNTER — Other Ambulatory Visit: Payer: Self-pay

## 2021-12-31 ENCOUNTER — Encounter (HOSPITAL_COMMUNITY): Payer: Self-pay

## 2021-12-31 ENCOUNTER — Ambulatory Visit: Payer: Medicare Other | Admitting: Surgery

## 2021-12-31 VITALS — BP 153/76 | HR 60 | Temp 97.7°F | Resp 17 | Ht 69.0 in | Wt 161.5 lb

## 2021-12-31 DIAGNOSIS — Z01818 Encounter for other preprocedural examination: Secondary | ICD-10-CM | POA: Insufficient documentation

## 2021-12-31 DIAGNOSIS — M1612 Unilateral primary osteoarthritis, left hip: Secondary | ICD-10-CM | POA: Insufficient documentation

## 2021-12-31 DIAGNOSIS — J449 Chronic obstructive pulmonary disease, unspecified: Secondary | ICD-10-CM | POA: Diagnosis not present

## 2021-12-31 DIAGNOSIS — Z87891 Personal history of nicotine dependence: Secondary | ICD-10-CM | POA: Diagnosis not present

## 2021-12-31 DIAGNOSIS — I10 Essential (primary) hypertension: Secondary | ICD-10-CM | POA: Diagnosis not present

## 2021-12-31 DIAGNOSIS — R9431 Abnormal electrocardiogram [ECG] [EKG]: Secondary | ICD-10-CM | POA: Diagnosis not present

## 2021-12-31 DIAGNOSIS — M810 Age-related osteoporosis without current pathological fracture: Secondary | ICD-10-CM | POA: Diagnosis not present

## 2021-12-31 LAB — CBC
HCT: 39.9 % (ref 36.0–46.0)
Hemoglobin: 13.2 g/dL (ref 12.0–15.0)
MCH: 30.7 pg (ref 26.0–34.0)
MCHC: 33.1 g/dL (ref 30.0–36.0)
MCV: 92.8 fL (ref 80.0–100.0)
Platelets: 215 10*3/uL (ref 150–400)
RBC: 4.3 MIL/uL (ref 3.87–5.11)
RDW: 13.3 % (ref 11.5–15.5)
WBC: 8.9 10*3/uL (ref 4.0–10.5)
nRBC: 0 % (ref 0.0–0.2)

## 2021-12-31 LAB — TYPE AND SCREEN
ABO/RH(D): B POS
Antibody Screen: NEGATIVE

## 2021-12-31 LAB — BASIC METABOLIC PANEL
Anion gap: 6 (ref 5–15)
BUN: 13 mg/dL (ref 8–23)
CO2: 26 mmol/L (ref 22–32)
Calcium: 9.4 mg/dL (ref 8.9–10.3)
Chloride: 107 mmol/L (ref 98–111)
Creatinine, Ser: 0.59 mg/dL (ref 0.44–1.00)
GFR, Estimated: >60 mL/min (ref >60)
Glucose, Bld: 90 mg/dL (ref 70–99)
Potassium: 3.5 mmol/L (ref 3.5–5.1)
Sodium: 139 mmol/L (ref 135–145)

## 2021-12-31 LAB — SURGICAL PCR SCREEN
MRSA, PCR: NEGATIVE
Staphylococcus aureus: NEGATIVE

## 2021-12-31 NOTE — Progress Notes (Addendum)
PCP - Marland Kitchen Cardiologist - Denies  PPM/ICD - Denies  Chest x-ray - NI EKG - 12/31/21 Stress Test - Denies ECHO - Denies Cardiac Cath - Denies  Sleep Study - Denies  Denies DM   Blood Thinner Instructions:Denies Aspirin Instructions:Denies  COVID TEST-  NI   Anesthesia review: Yes abnormal EKG  Patient denies shortness of breath, fever, cough and chest pain at PAT appointment   All instructions explained to the patient, with a verbal understanding of the material. Patient agrees to go over the instructions while at home for a better understanding.  The opportunity to ask questions was provided.

## 2022-01-01 NOTE — Progress Notes (Addendum)
Anesthesia Chart Review:  Case: [id number] Date/Time: 01/07/22 1215   Procedure: LEFT TOTAL HIP ARTHROPLASTY ANTERIOR APPROACH (Left: Hip)   Anesthesia type: Spinal   Pre-op diagnosis: left hip osteoarthritis   Location: [facility] OR ROOM 06 / [place] [facility]   Surgeons: Marybelle Killings, MD       DISCUSSION: Patient is an 81 year old female scheduled for the above procedure.  History includes former smoker, COPD, HTN , osteoarthritis, osteoporosis, hepatic cyst (large benign appearing hepatic cyst with 2 smaller cysts 07/04/04, stable on f/u US 04/13/05).   Last PCP visit with Dr. Doren Custard was 12/04/21 for HTN follow-up. She had initially been evaluated by Vena Rua, FNP on 11/06/21 who would not clear her for surgery until her HTN was better controlled. Amlodpine increased to 7.5 mg daily and continued HCTZ at 25 mg. BP 138/72, improved, although goal SBP < 130. She thought pain was contributing, so no additional medication adjustments made. He plans to see her back in 2 months after surgery.   EKG on 12/31/21 showed sinus bradycardia at 52 bpm, T wave abnormality, consider anterolateral ischemia. When compared to 08/20/14 EKG, anterolateral T wave inversions is likely new, or at least more pronounced as previously changes were more flat and non-specific. No denied chest pain and SOB at PAT RN visit. Reviewed with anesthesiologist Myrtie Soman, MD. She had recent primary care evaluations, so will defer additional recommendations, if any, to primary care and/or surgeon. She is scheduled for orthopedic preoperative evaluation on 01/02/22. Her CBC and BMET were normal.  Will send communication to primary care and orthopedics and leave chart for follow-up. (UPDATE 01/01/22 5:15 PM: I spoke with Dr. Doren Custard. BP was 153/76 at PAT, so he will consider increasing her amlodipine to 10 mg daily. Given EKG changes and co-morbidities, he would recommend preoperative cardiology evaluation. I have sent communication to Dr. Lorin Mercy'  scheduler.)   ADDENDUM 01/05/22 4:21 PM: She had preoperative cardiology evaluation with Dr. Phineas Inches today. BP was 128/78. She wrote, "Pre-Op: Can do > 4 METS without CP. Has benign ecgs, TWI non specific, she is asymptomatic. Her blood pressure is well controlled. No CVD history. Her age is her highest risk, this can't be mitigated. She has no indication for further cardiac w/u. She is acceptable cardiac risk for her procedure."  Anesthesia team to evaluate on the day of surgery.   VS: BP (!) 153/76   Pulse 60   Temp 36.5 C   Resp 17   Ht '5\' 9"'$  (1.753 m)   Wt 73.3 kg   SpO2 98%   BMI 23.85 kg/m    PROVIDERS: Johnette Abraham, MD is PCP ([facility])   LABS: Labs reviewed: Acceptable for surgery. A1c 5.6% 07/01/21.  (all labs ordered are listed, but only abnormal results are displayed)  Labs Reviewed  SURGICAL PCR SCREEN  BASIC METABOLIC PANEL  CBC  TYPE AND SCREEN    IMAGES: Xray Left Hip 09/22/21: Impression:  Severe arthritis left hip worse than previous x-ray   EKG: Sinus bradycardia  T wave abnormality, consider anterolateral ischemia Abnormal ECG - See DISCUSSION.   CV: N/A   Past Medical History:  Diagnosis Date   Arthritis    Shoulder   Compression fracture of L4 lumbar vertebra    Pt did not have pain, osteoporosis   COPD (chronic obstructive pulmonary disease) ([place]) 03/02/2008   PFT   Hepatic cyst    Hypertension    Osteoporosis     Past  Surgical History:  Procedure Laterality Date   ABDOMINAL HYSTERECTOMY     CATARACT EXTRACTION W/PHACO Right 08/23/2014   Procedure: CATARACT EXTRACTION PHACO AND INTRAOCULAR LENS PLACEMENT RIGHT EYE; CDE:  10.80;  Surgeon: Tonny Branch, MD;  Location: [facility] ORS;  Service: Ophthalmology;  Laterality: Right;   CATARACT EXTRACTION W/PHACO Left 10/08/2014   Procedure: CATARACT EXTRACTION PHACO AND INTRAOCULAR LENS PLACEMENT (IOC);  Surgeon: Tonny Branch, MD;  Location: [facility] ORS;  Service:  Ophthalmology;  Laterality: Left;  CDE 6.60    MEDICATIONS:  albuterol (VENTOLIN HFA) 108 (90 Base) MCG/ACT inhaler   alendronate (FOSAMAX) 70 MG tablet   amLODipine (NORVASC) 2.5 MG tablet   amLODipine (NORVASC) 5 MG tablet   Calcium Carbonate-Vitamin D (CALTRATE 600+D PO)   Cholecalciferol (VITAMIN D3) 25 MCG (1000 UT) CAPS   COD LIVER OIL PO   hydrochlorothiazide (HYDRODIURIL) 25 MG tablet   No current facility-administered medications for this encounter.    Myra Gianotti, PA-C Surgical Short Stay/Anesthesiology [facility] Phone [phone] [facility] Phone [phone] 01/01/2022 4:38 PM

## 2022-01-02 ENCOUNTER — Other Ambulatory Visit: Payer: Self-pay

## 2022-01-02 ENCOUNTER — Ambulatory Visit: Payer: Medicare Other | Admitting: Surgery

## 2022-01-02 MED ORDER — AMLODIPINE BESYLATE 10 MG PO TABS: 10.0000 mg | ORAL_TABLET | Freq: Every day | ORAL | 0 refills | Status: DC

## 2022-01-02 MED ORDER — AMLODIPINE BESYLATE 10 MG PO TABS: 10.0000 mg | ORAL_TABLET | Freq: Every day | ORAL | 3 refills | Status: DC

## 2022-01-05 ENCOUNTER — Ambulatory Visit: Payer: Medicare Other | Attending: Internal Medicine | Admitting: Internal Medicine

## 2022-01-05 ENCOUNTER — Encounter: Payer: Self-pay | Admitting: Internal Medicine

## 2022-01-05 VITALS — BP 128/78 | HR 71 | Ht 64.0 in | Wt 162.6 lb

## 2022-01-05 DIAGNOSIS — Z01810 Encounter for preprocedural cardiovascular examination: Secondary | ICD-10-CM | POA: Diagnosis not present

## 2022-01-05 NOTE — Progress Notes (Signed)
Cardiology Office Note:    Date:  01/05/2022   ID:  Alexis Davis, DOB 08-16-1940, MRN [id number]  PCP:  Johnette Abraham, MD   [facility] Providers Cardiologist:  None     Referring MD: Johnette Abraham, MD   No chief complaint on file. Pre-op  History of Present Illness:    Alexis Davis is a 81 y.o. female with a hx of HTN well controlled, referral for pre-op for L total hip arthroplasty. ECG shows sinus bradycardia 50s minimal TWI, benign. Had recent ABIs that were normal. No cardiac dx history. She can walk up stairs without CP or SOB. She denies lower extremity edema, PND or orthopnea.    Past Medical History:  Diagnosis Date   Arthritis    Shoulder   Compression fracture of L4 lumbar vertebra    Pt did not have pain, osteoporosis   COPD (chronic obstructive pulmonary disease) ([place]) 03/02/2008   PFT   Hepatic cyst    Hypertension    Osteoporosis     Past Surgical History:  Procedure Laterality Date   ABDOMINAL HYSTERECTOMY     CATARACT EXTRACTION W/PHACO Right 08/23/2014   Procedure: CATARACT EXTRACTION PHACO AND INTRAOCULAR LENS PLACEMENT RIGHT EYE; CDE:  10.80;  Surgeon: Tonny Naraya Stoneberg, MD;  Location: [facility];  Service: Ophthalmology;  Laterality: Right;   CATARACT EXTRACTION W/PHACO Left 10/08/2014   Procedure: CATARACT EXTRACTION PHACO AND INTRAOCULAR LENS PLACEMENT (IOC);  Surgeon: Tonny Sonakshi Rolland, MD;  Location: [facility]S;  Service: Ophthalmology;  Laterality: Left;  CDE 6.60    Current Medications: No outpatient medications have been marked as taking for the 01/05/22 encounter (Appointment) with Janina Mayo, MD.     Allergies:   Patient has no known allergies.   Social History   Socioeconomic History   Marital status: Married    Spouse name: Broadus John   Number of children: 3   Years of education: 12   Highest education level: Not on file  Occupational History   Not on file  Tobacco Use   Smoking status: Former    Types: Cigarettes    Smokeless tobacco: Never   Tobacco comments:    She quit smoking in January 2022. Smoked for 20 years, a pack usually last her a week.  Vaping Use   Vaping Use: Never used  Substance and Sexual Activity   Alcohol use: No   Drug use: No   Sexual activity: Yes  Other Topics Concern   Not on file  Social History Narrative   Lives at home with her husband, has 3 children, pt is retired.   Social Determinants of Health   Financial Resource Strain: Low Risk  (07/18/2019)   Overall Financial Resource Strain (CARDIA)    Difficulty of Paying Living Expenses: Not very hard  Food Insecurity: Not on file  Transportation Needs: Not on file  Physical Activity: Not on file  Stress: Not on file  Social Connections: Not on file     Family History: The patient's family history includes Arthritis in an other family member; Diabetes in her mother; Diabetes Mellitus II in her son; Heart disease in her father; Hypertension in her brother, sister, sister, and sister. There is no history of Breast cancer, Colon cancer, or Cancer - Lung.  ROS:   Please see the history of present illness.     All other systems reviewed and are negative.  EKGs/Labs/Other Studies Reviewed:    The following studies were reviewed today:  EKG:  EKG is  ordered today.  The ekg ordered today demonstrates   11/6-NSR, non specific t wave changes, unchanged from prior  Recent Labs: 07/01/2021: ALT 10; Magnesium 2.0; TSH 1.520 12/31/2021: BUN 13; Creatinine, Ser 0.59; Hemoglobin 13.2; Platelets 215; Potassium 3.5; Sodium 139   Recent Lipid Panel    Component Value Date/Time   CHOL 167 07/01/2021 1151   TRIG 77 07/01/2021 1151   HDL 56 07/01/2021 1151   CHOLHDL 3.0 07/01/2021 1151   CHOLHDL 3.6 06/05/2020 0905   VLDL 31 12/11/2013 1209   LDLCALC 96 07/01/2021 1151   LDLCALC 99 06/05/2020 0905     Risk Assessment/Calculations:         Physical Exam:    VS:   Vitals:   01/05/22 1500  BP: 128/78  Pulse: 71   SpO2: 97%    Wt Readings from Last 3 Encounters:  12/31/21 161 lb 8 oz (73.3 kg)  12/04/21 164 lb (74.4 kg)  11/27/21 167 lb (75.8 kg)     GEN:  Well nourished, well developed in no acute distress HEENT: Normal NECK: No JVD; No carotid bruits LYMPHATICS: No lymphadenopathy CARDIAC: RRR, no murmurs, rubs, gallops RESPIRATORY:  Clear to auscultation without rales, wheezing or rhonchi  ABDOMEN: Soft, non-tender, non-distended MUSCULOSKELETAL:  No edema; No deformity  SKIN: Warm and dry NEUROLOGIC:  Alert and oriented x 3 PSYCHIATRIC:  Normal affect   ASSESSMENT:   Pre-Op: Can do > 4 METS without CP. Has benign ecgs, TWI non specific, she is asymptomatic. Her blood pressure is well controlled. No CVD history. Her age is her highest risk, this can't be mitigated. She has no indication for further cardiac w/u. She is acceptable cardiac risk for her procedure PLAN:    In order of problems listed above:  Acceptable cardiac risk for left hip arthroplasty       Medication Adjustments/Labs and Tests Ordered: Current medicines are reviewed at length with the patient today.  Concerns regarding medicines are outlined above.  No orders of the defined types were placed in this encounter.  No orders of the defined types were placed in this encounter.   There are no Patient Instructions on file for this visit.   Signed, Janina Mayo, MD  01/05/2022 8:35 AM    [place]

## 2022-01-05 NOTE — Anesthesia Preprocedure Evaluation (Addendum)
Anesthesia Evaluation  Patient identified by MRN, date of birth, ID band Patient awake    Reviewed: Allergy & Precautions, NPO status , Patient's Chart, lab work & pertinent test results  History of Anesthesia Complications Negative for: history of anesthetic complications  Airway Mallampati: II  TM Distance: >3 FB Neck ROM: Full    Dental   Pulmonary COPD, former smoker   Pulmonary exam normal        Cardiovascular hypertension, Pt. on medications Normal cardiovascular exam     Neuro/Psych negative neurological ROS     GI/Hepatic negative GI ROS, Neg liver ROS,,,  Endo/Other  negative endocrine ROS    Renal/GU negative Renal ROS  negative genitourinary   Musculoskeletal  (+) Arthritis , Osteoarthritis,    Abdominal   Peds  Hematology negative hematology ROS (+)   Anesthesia Other Findings   Reproductive/Obstetrics                             Anesthesia Physical Anesthesia Plan  ASA: 2  Anesthesia Plan: Spinal   Post-op Pain Management: Tylenol PO (pre-op)* and Toradol IV (intra-op)*   Induction: Intravenous  PONV Risk Score and Plan: 2 and Propofol infusion, Treatment may vary due to age or medical condition, Ondansetron and TIVA  Airway Management Planned: Nasal Cannula and Simple Face Mask  Additional Equipment: None  Intra-op Plan:   Post-operative Plan:   Informed Consent: I have reviewed the patients History and Physical, chart, labs and discussed the procedure including the risks, benefits and alternatives for the proposed anesthesia with the patient or authorized representative who has indicated his/her understanding and acceptance.     Dental advisory given  Plan Discussed with: CRNA and Surgeon  Anesthesia Plan Comments: (PAT note written by Shonna Chock, PA-C.  )       Anesthesia Quick Evaluation

## 2022-01-05 NOTE — Patient Instructions (Signed)
Medication Instructions:  No Changes In Medications at this time.  *If you need a refill on your cardiac medications before your next appointment, please call your pharmacy*  Lab Work: None Ordered At This Time.  If you have labs (blood work) drawn today and your tests are completely normal, you will receive your results only by: [place] (if you have MyChart) OR A paper copy in the mail If you have any lab test that is abnormal or we need to change your treatment, we will call you to review the results.  Testing/Procedures: None Ordered At This Time.   Follow-Up: At [facility], you and your health needs are our priority.  As part of our continuing mission to provide you with exceptional heart care, we have created designated Provider Care Teams.  These Care Teams include your primary Cardiologist (physician) and Advanced Practice Providers (APPs -  Physician Assistants and Nurse Practitioners) who all work together to provide you with the care you need, when you need it.  Your next appointment:   AS NEEDED   The format for your next appointment:   In Person  Provider:   Janina Mayo, MD

## 2022-01-06 ENCOUNTER — Ambulatory Visit: Payer: Medicare Other | Admitting: Surgery

## 2022-01-06 ENCOUNTER — Telehealth: Payer: Self-pay | Admitting: *Deleted

## 2022-01-06 VITALS — BP 147/77 | HR 62 | Ht 64.0 in | Wt 164.0 lb

## 2022-01-06 DIAGNOSIS — M1612 Unilateral primary osteoarthritis, left hip: Secondary | ICD-10-CM

## 2022-01-06 NOTE — Progress Notes (Signed)
81 year old black female with history of end-stage DJD left hip and pain comes in for prep evaluation.  Symptoms unchanged from previous visit.  She is wanting to proceed with left total hip replacement as scheduled.  We received preop cardiac clearance.  Today history and physical performed.  Review of systems negative.  Plan We will proceed with surgery as scheduled.  Advised patient anticipate being in the hospital at least 2 days.  States that she has 3 children will be coming to help along with a church member that will offer assistance.  All questions answered.

## 2022-01-06 NOTE — Care Plan (Signed)
[facility] RNCM met with patient today during her H&P appointment with Dr. Lorin Mercy' PA. She is an Ortho bundle patient through [facility] and agreeable to case management. She lives with her spouse and has friends/family that will be assisting her after discharge. She has a RW already. No other DME needs. Will see how patient progresses with therapy in the hospital to determine if HHPT will be needed. Reviewed all post op care instructions. Copy given for patient to take home for her notes. She is aware of how to contact RNCM for needs. Will continue to follow.

## 2022-01-06 NOTE — Telephone Encounter (Signed)
Ortho bundle Pre-op call completed. 

## 2022-01-07 ENCOUNTER — Ambulatory Visit (HOSPITAL_BASED_OUTPATIENT_CLINIC_OR_DEPARTMENT_OTHER): Payer: Medicare Other | Admitting: Anesthesiology

## 2022-01-07 ENCOUNTER — Ambulatory Visit (HOSPITAL_COMMUNITY): Payer: Medicare Other | Admitting: Vascular Surgery

## 2022-01-07 ENCOUNTER — Observation Stay (HOSPITAL_COMMUNITY): Payer: Medicare Other

## 2022-01-07 ENCOUNTER — Encounter (HOSPITAL_COMMUNITY): Payer: Self-pay | Admitting: Orthopaedic Surgery

## 2022-01-07 ENCOUNTER — Other Ambulatory Visit: Payer: Self-pay

## 2022-01-07 ENCOUNTER — Encounter (HOSPITAL_COMMUNITY)
Admission: RE | Disposition: A | Discharge status: Home Health | Payer: Self-pay | Source: Ambulatory Visit | Attending: Orthopaedic Surgery

## 2022-01-07 ENCOUNTER — Observation Stay (HOSPITAL_COMMUNITY)
Admission: RE | Admit: 2022-01-07 | Discharge: 2022-01-09 | Disposition: A | Discharge status: Home Health | Payer: Medicare Other | Source: Ambulatory Visit | Attending: Orthopaedic Surgery | Admitting: Orthopaedic Surgery

## 2022-01-07 ENCOUNTER — Ambulatory Visit (HOSPITAL_COMMUNITY): Payer: Medicare Other

## 2022-01-07 DIAGNOSIS — R600 Localized edema: Secondary | ICD-10-CM | POA: Diagnosis not present

## 2022-01-07 DIAGNOSIS — Z96642 Presence of left artificial hip joint: Secondary | ICD-10-CM | POA: Diagnosis not present

## 2022-01-07 DIAGNOSIS — I10 Essential (primary) hypertension: Secondary | ICD-10-CM | POA: Diagnosis not present

## 2022-01-07 DIAGNOSIS — J449 Chronic obstructive pulmonary disease, unspecified: Secondary | ICD-10-CM | POA: Insufficient documentation

## 2022-01-07 DIAGNOSIS — M1612 Unilateral primary osteoarthritis, left hip: Principal | ICD-10-CM

## 2022-01-07 DIAGNOSIS — Z87891 Personal history of nicotine dependence: Secondary | ICD-10-CM | POA: Diagnosis not present

## 2022-01-07 DIAGNOSIS — Z01818 Encounter for other preprocedural examination: Secondary | ICD-10-CM | POA: Diagnosis not present

## 2022-01-07 DIAGNOSIS — Z471 Aftercare following joint replacement surgery: Secondary | ICD-10-CM | POA: Diagnosis not present

## 2022-01-07 HISTORY — PX: TOTAL HIP ARTHROPLASTY: SHX124

## 2022-01-07 LAB — ABO/RH: ABO/RH(D): B POS

## 2022-01-07 SURGERY — ARTHROPLASTY, HIP, TOTAL, ANTERIOR APPROACH
Anesthesia: Spinal | Site: Hip | Laterality: Left

## 2022-01-07 MED ORDER — METOCLOPRAMIDE HCL 5 MG PO TABS: 5.0000 mg | ORAL_TABLET | Freq: Three times a day (TID) | ORAL | Status: DC | PRN

## 2022-01-07 MED ORDER — ONDANSETRON HCL 4 MG/2ML IJ SOLN: 4.0000 mg | Freq: Four times a day (QID) | INTRAMUSCULAR | Status: DC | PRN

## 2022-01-07 MED ORDER — ACETAMINOPHEN 500 MG PO TABS
1000.0000 mg | ORAL_TABLET | Freq: Once | ORAL | Status: AC
  Administered 2022-01-07: 1000 mg via ORAL
  Filled 2022-01-07: qty 2

## 2022-01-07 MED ORDER — BUPIVACAINE IN DEXTROSE 0.75-8.25 % IT SOLN
INTRATHECAL | Status: DC | PRN
  Administered 2022-01-07: 1.6 mL via INTRATHECAL

## 2022-01-07 MED ORDER — HYDROCHLOROTHIAZIDE 25 MG PO TABS
25.0000 mg | ORAL_TABLET | Freq: Every day | ORAL | Status: DC
  Administered 2022-01-07 – 2022-01-09 (×3): 25 mg via ORAL
  Filled 2022-01-07 (×3): qty 1

## 2022-01-07 MED ORDER — PROPOFOL 10 MG/ML IV BOLUS
INTRAVENOUS | Status: DC | PRN
  Administered 2022-01-07 (×2): 20 mg via INTRAVENOUS

## 2022-01-07 MED ORDER — VITAMIN D3 25 MCG (1000 UNIT) PO TABS
1000.0000 [IU] | ORAL_TABLET | Freq: Every day | ORAL | Status: DC
  Administered 2022-01-07 – 2022-01-09 (×3): 1000 [IU] via ORAL
  Filled 2022-01-07 (×6): qty 1

## 2022-01-07 MED ORDER — PHENYLEPHRINE HCL-NACL 20-0.9 MG/250ML-% IV SOLN
INTRAVENOUS | Status: DC | PRN
  Administered 2022-01-07: 30 ug/min via INTRAVENOUS

## 2022-01-07 MED ORDER — CEFAZOLIN SODIUM-DEXTROSE 2-4 GM/100ML-% IV SOLN
2.0000 g | INTRAVENOUS | Status: AC
  Administered 2022-01-07: 2 g via INTRAVENOUS
  Filled 2022-01-07: qty 100

## 2022-01-07 MED ORDER — PROPOFOL 10 MG/ML IV BOLUS
INTRAVENOUS | Status: AC
  Filled 2022-01-07: qty 20

## 2022-01-07 MED ORDER — METOCLOPRAMIDE HCL 5 MG/ML IJ SOLN: 5.0000 mg | Freq: Three times a day (TID) | INTRAMUSCULAR | Status: DC | PRN

## 2022-01-07 MED ORDER — OXYCODONE HCL 5 MG/5ML PO SOLN: 5.0000 mg | Freq: Once | ORAL | Status: DC | PRN

## 2022-01-07 MED ORDER — HYDROMORPHONE HCL 1 MG/ML IJ SOLN
0.5000 mg | INTRAMUSCULAR | Status: DC | PRN
  Administered 2022-01-08 (×2): 0.5 mg via INTRAVENOUS
  Filled 2022-01-07 (×2): qty 0.5

## 2022-01-07 MED ORDER — TRANEXAMIC ACID-NACL 1000-0.7 MG/100ML-% IV SOLN
INTRAVENOUS | Status: AC
  Filled 2022-01-07: qty 100

## 2022-01-07 MED ORDER — BUPIVACAINE HCL (PF) 0.5 % IJ SOLN
INTRAMUSCULAR | Status: AC
  Filled 2022-01-07: qty 10

## 2022-01-07 MED ORDER — ALENDRONATE SODIUM 70 MG PO TABS: 70.0000 mg | ORAL_TABLET | ORAL | Status: DC

## 2022-01-07 MED ORDER — PHENYLEPHRINE 80 MCG/ML (10ML) SYRINGE FOR IV PUSH (FOR BLOOD PRESSURE SUPPORT)
PREFILLED_SYRINGE | INTRAVENOUS | Status: AC
  Filled 2022-01-07: qty 10

## 2022-01-07 MED ORDER — PROPOFOL 500 MG/50ML IV EMUL
INTRAVENOUS | Status: DC | PRN
  Administered 2022-01-07: 85 ug/kg/min via INTRAVENOUS

## 2022-01-07 MED ORDER — PHENOL 1.4 % MT LIQD: 1.0000 | OROMUCOSAL | Status: DC | PRN

## 2022-01-07 MED ORDER — SODIUM CHLORIDE 0.9 % IV SOLN: INTRAVENOUS | Status: DC

## 2022-01-07 MED ORDER — PHENYLEPHRINE 80 MCG/ML (10ML) SYRINGE FOR IV PUSH (FOR BLOOD PRESSURE SUPPORT)
PREFILLED_SYRINGE | INTRAVENOUS | Status: DC | PRN
  Administered 2022-01-07: 160 ug via INTRAVENOUS

## 2022-01-07 MED ORDER — ONDANSETRON HCL 4 MG/2ML IJ SOLN: 4.0000 mg | Freq: Once | INTRAMUSCULAR | Status: DC | PRN

## 2022-01-07 MED ORDER — POLYETHYLENE GLYCOL 3350 17 G PO PACK: 17.0000 g | PACK | Freq: Every day | ORAL | Status: DC | PRN

## 2022-01-07 MED ORDER — EPHEDRINE 5 MG/ML INJ
INTRAVENOUS | Status: AC
  Filled 2022-01-07: qty 5

## 2022-01-07 MED ORDER — ASPIRIN 325 MG PO TBEC
325.0000 mg | DELAYED_RELEASE_TABLET | Freq: Every day | ORAL | Status: DC
  Administered 2022-01-08 – 2022-01-09 (×2): 325 mg via ORAL
  Filled 2022-01-07 (×2): qty 1

## 2022-01-07 MED ORDER — LACTATED RINGERS IV SOLN: INTRAVENOUS | Status: DC

## 2022-01-07 MED ORDER — AMLODIPINE BESYLATE 10 MG PO TABS
10.0000 mg | ORAL_TABLET | Freq: Every day | ORAL | Status: DC
  Administered 2022-01-07 – 2022-01-09 (×3): 10 mg via ORAL
  Filled 2022-01-07 (×3): qty 1

## 2022-01-07 MED ORDER — ACETAMINOPHEN 325 MG PO TABS
325.0000 mg | ORAL_TABLET | Freq: Four times a day (QID) | ORAL | Status: DC | PRN
  Administered 2022-01-08: 650 mg via ORAL
  Filled 2022-01-07: qty 2

## 2022-01-07 MED ORDER — CHLORHEXIDINE GLUCONATE 0.12 % MT SOLN
15.0000 mL | Freq: Once | OROMUCOSAL | Status: AC
  Administered 2022-01-07: 15 mL via OROMUCOSAL
  Filled 2022-01-07: qty 15

## 2022-01-07 MED ORDER — ONDANSETRON HCL 4 MG/2ML IJ SOLN
INTRAMUSCULAR | Status: DC | PRN
  Administered 2022-01-07: 4 mg via INTRAVENOUS

## 2022-01-07 MED ORDER — CHLORHEXIDINE GLUCONATE 4 % EX LIQD: 60.0000 mL | Freq: Once | CUTANEOUS | Status: DC

## 2022-01-07 MED ORDER — FENTANYL CITRATE (PF) 100 MCG/2ML IJ SOLN
25.0000 ug | INTRAMUSCULAR | Status: DC | PRN
  Administered 2022-01-07: 50 ug via INTRAVENOUS

## 2022-01-07 MED ORDER — ALBUTEROL SULFATE (2.5 MG/3ML) 0.083% IN NEBU: 2.5000 mg | INHALATION_SOLUTION | RESPIRATORY_TRACT | Status: DC | PRN

## 2022-01-07 MED ORDER — EPHEDRINE SULFATE-NACL 50-0.9 MG/10ML-% IV SOSY
PREFILLED_SYRINGE | INTRAVENOUS | Status: DC | PRN
  Administered 2022-01-07 (×2): 10 mg via INTRAVENOUS

## 2022-01-07 MED ORDER — METHOCARBAMOL 1000 MG/10ML IJ SOLN: 500.0000 mg | Freq: Four times a day (QID) | INTRAVENOUS | Status: DC | PRN

## 2022-01-07 MED ORDER — ORAL CARE MOUTH RINSE: 15.0000 mL | Freq: Once | OROMUCOSAL | Status: AC

## 2022-01-07 MED ORDER — DOCUSATE SODIUM 100 MG PO CAPS
100.0000 mg | ORAL_CAPSULE | Freq: Two times a day (BID) | ORAL | Status: DC
  Administered 2022-01-07 – 2022-01-09 (×4): 100 mg via ORAL
  Filled 2022-01-07 (×4): qty 1

## 2022-01-07 MED ORDER — DEXAMETHASONE SODIUM PHOSPHATE 10 MG/ML IJ SOLN
INTRAMUSCULAR | Status: DC | PRN
  Administered 2022-01-07: 8 mg via INTRAVENOUS

## 2022-01-07 MED ORDER — METHOCARBAMOL 500 MG PO TABS
500.0000 mg | ORAL_TABLET | Freq: Four times a day (QID) | ORAL | Status: DC | PRN
  Administered 2022-01-07 – 2022-01-08 (×3): 500 mg via ORAL
  Filled 2022-01-07 (×3): qty 1

## 2022-01-07 MED ORDER — AMISULPRIDE (ANTIEMETIC) 5 MG/2ML IV SOLN: 10.0000 mg | Freq: Once | INTRAVENOUS | Status: DC | PRN

## 2022-01-07 MED ORDER — BUPIVACAINE LIPOSOME 1.3 % IJ SUSP
INTRAMUSCULAR | Status: DC | PRN
  Administered 2022-01-07: 20 mL

## 2022-01-07 MED ORDER — 0.9 % SODIUM CHLORIDE (POUR BTL) OPTIME
TOPICAL | Status: DC | PRN
  Administered 2022-01-07: 1000 mL

## 2022-01-07 MED ORDER — MENTHOL 3 MG MT LOZG: 1.0000 | LOZENGE | OROMUCOSAL | Status: DC | PRN

## 2022-01-07 MED ORDER — FENTANYL CITRATE (PF) 100 MCG/2ML IJ SOLN
INTRAMUSCULAR | Status: AC
  Filled 2022-01-07: qty 2

## 2022-01-07 MED ORDER — OXYCODONE HCL 5 MG PO TABS: 5.0000 mg | ORAL_TABLET | Freq: Once | ORAL | Status: DC | PRN

## 2022-01-07 MED ORDER — OXYCODONE HCL 5 MG PO TABS
5.0000 mg | ORAL_TABLET | ORAL | Status: DC | PRN
  Administered 2022-01-07 – 2022-01-08 (×3): 5 mg via ORAL
  Filled 2022-01-07 (×4): qty 1

## 2022-01-07 MED ORDER — ALBUTEROL SULFATE HFA 108 (90 BASE) MCG/ACT IN AERS: 2.0000 | INHALATION_SPRAY | RESPIRATORY_TRACT | Status: DC | PRN

## 2022-01-07 MED ORDER — ONDANSETRON HCL 4 MG PO TABS: 4.0000 mg | ORAL_TABLET | Freq: Four times a day (QID) | ORAL | Status: DC | PRN

## 2022-01-07 MED ORDER — TRANEXAMIC ACID-NACL 1000-0.7 MG/100ML-% IV SOLN
INTRAVENOUS | Status: DC | PRN
  Administered 2022-01-07: 1000 mg via INTRAVENOUS

## 2022-01-07 MED ORDER — POVIDONE-IODINE 10 % EX SWAB
2.0000 | Freq: Once | CUTANEOUS | Status: AC
  Administered 2022-01-07: 2 via TOPICAL

## 2022-01-07 SURGICAL SUPPLY — 60 items
APL SKNCLS STERI-STRIP NONHPOA (GAUZE/BANDAGES/DRESSINGS) ×1
BAG COUNTER SPONGE SURGICOUNT (BAG) ×1 IMPLANT
BAG SPNG CNTER NS LX DISP (BAG) ×1
BENZOIN TINCTURE PRP APPL 2/3 (GAUZE/BANDAGES/DRESSINGS) ×1 IMPLANT
BLADE CLIPPER SURG (BLADE) IMPLANT
BLADE SAW SGTL 18X1.27X75 (BLADE) ×1 IMPLANT
COVER PERINEAL POST (MISCELLANEOUS) ×1 IMPLANT
COVER SURGICAL LIGHT HANDLE (MISCELLANEOUS) ×1 IMPLANT
CUP SECTOR GRIPTON 50MM (Cup) IMPLANT
DRAPE C-ARM 42X72 X-RAY (DRAPES) ×1 IMPLANT
DRAPE IMP U-DRAPE 54X76 (DRAPES) ×1 IMPLANT
DRAPE STERI IOBAN 125X83 (DRAPES) ×1 IMPLANT
DRAPE U-SHAPE 47X51 STRL (DRAPES) ×3 IMPLANT
DRSG MEPILEX BORDER 4X8 (GAUZE/BANDAGES/DRESSINGS) ×1 IMPLANT
DRSG MEPILEX POST OP 4X12 (GAUZE/BANDAGES/DRESSINGS) IMPLANT
DURAPREP 26ML APPLICATOR (WOUND CARE) ×1 IMPLANT
ELECT BLADE 4.0 EZ CLEAN MEGAD (MISCELLANEOUS)
ELECT CAUTERY BLADE 6.4 (BLADE) ×1 IMPLANT
ELECT REM PT RETURN 9FT ADLT (ELECTROSURGICAL) ×1
ELECTRODE BLDE 4.0 EZ CLN MEGD (MISCELLANEOUS) IMPLANT
ELECTRODE REM PT RTRN 9FT ADLT (ELECTROSURGICAL) ×1 IMPLANT
ELIMINATOR HOLE APEX DEPUY (Hips) IMPLANT
FACESHIELD WRAPAROUND (MASK) ×2 IMPLANT
FACESHIELD WRAPAROUND OR TEAM (MASK) ×2 IMPLANT
GLOVE BIOGEL PI IND STRL 8 (GLOVE) ×2 IMPLANT
GLOVE ORTHO TXT STRL SZ7.5 (GLOVE) ×2 IMPLANT
GOWN STRL REUS W/ TWL LRG LVL3 (GOWN DISPOSABLE) ×1 IMPLANT
GOWN STRL REUS W/ TWL XL LVL3 (GOWN DISPOSABLE) ×1 IMPLANT
GOWN STRL REUS W/TWL 2XL LVL3 (GOWN DISPOSABLE) ×1 IMPLANT
GOWN STRL REUS W/TWL LRG LVL3 (GOWN DISPOSABLE) ×1
GOWN STRL REUS W/TWL XL LVL3 (GOWN DISPOSABLE) ×1
HEAD FEM STD 32X+1 STRL (Hips) IMPLANT
KIT BASIN OR (CUSTOM PROCEDURE TRAY) ×1 IMPLANT
KIT TURNOVER KIT B (KITS) ×1 IMPLANT
LINER ACET PNNCL PLUS4 NEUTRAL (Hips) IMPLANT
MANIFOLD NEPTUNE II (INSTRUMENTS) ×1 IMPLANT
NDL HYPO 21X1 ECLIPSE (NEEDLE) ×1 IMPLANT
NEEDLE HYPO 21X1 ECLIPSE (NEEDLE) ×1 IMPLANT
NEEDLE HYPO 22GX1.5 SAFETY (NEEDLE) IMPLANT
NS IRRIG 1000ML POUR BTL (IV SOLUTION) ×1 IMPLANT
PACK TOTAL JOINT (CUSTOM PROCEDURE TRAY) ×1 IMPLANT
PAD ARMBOARD 7.5X6 YLW CONV (MISCELLANEOUS) ×2 IMPLANT
PINNACLE PLUS 4 NEUTRAL (Hips) ×1 IMPLANT
PULSAVAC PLUS IRRIG FAN TIP (DISPOSABLE)
STAPLER VISISTAT 35W (STAPLE) IMPLANT
STEM FEMORAL SZ5 HIGH ACTIS (Stem) IMPLANT
STRIP CLOSURE SKIN 1/2X4 (GAUZE/BANDAGES/DRESSINGS) ×1 IMPLANT
SUT VIC AB 0 CT1 27 (SUTURE) ×2
SUT VIC AB 0 CT1 27XBRD ANBCTR (SUTURE) ×1 IMPLANT
SUT VIC AB 2-0 CT1 27 (SUTURE) ×2
SUT VIC AB 2-0 CT1 TAPERPNT 27 (SUTURE) ×1 IMPLANT
SUT VICRYL 4-0 PS2 18IN ABS (SUTURE) ×1 IMPLANT
SUT VLOC 180 0 24IN GS25 (SUTURE) ×1 IMPLANT
SYR 20CC LL (SYRINGE) ×1 IMPLANT
TIP FAN IRRIG PULSAVAC PLUS (DISPOSABLE) IMPLANT
TOWEL GREEN STERILE (TOWEL DISPOSABLE) ×2 IMPLANT
TOWEL GREEN STERILE FF (TOWEL DISPOSABLE) ×1 IMPLANT
TRAY FOLEY MTR SLVR 16FR STAT (SET/KITS/TRAYS/PACK) ×1 IMPLANT
WATER STERILE IRR 1000ML POUR (IV SOLUTION) ×2 IMPLANT
YANKAUER SUCT BULB TIP NO VENT (SUCTIONS) IMPLANT

## 2022-01-07 NOTE — Interval H&P Note (Signed)
History and Physical Interval Note:  01/07/2022 12:18 PM  Alexis Davis  has presented today for surgery, with the diagnosis of left hip osteoarthritis.  The various methods of treatment have been discussed with the patient and family. After consideration of risks, benefits and other options for treatment, the patient has consented to  Procedure(s): LEFT TOTAL HIP ARTHROPLASTY ANTERIOR APPROACH (Left) as a surgical intervention.  The patient's history has been reviewed, patient examined, no change in status, stable for surgery.  I have reviewed the patient's chart and labs.  Questions were answered to the patient's satisfaction.     Marybelle Killings

## 2022-01-07 NOTE — H&P (Signed)
TOTAL HIP ADMISSION H&P  Patient is admitted for left total hip arthroplasty.  Subjective:  Chief Complaint: left hip pain  HPI: Alexis Davis, 81 y.o. female, has a history of pain and functional disability in the left hip(s) due to arthritis and patient has failed non-surgical conservative treatments for greater than 12 weeks to include NSAID's and/or analgesics, use of assistive devices, and activity modification.  Onset of symptoms was gradual starting 10 years ago with gradually worsening course since that time.The patient noted no past surgery on the left hip(s).  Patient currently rates pain in the left hip at 10 out of 10 with activity. Patient has night pain, worsening of pain with activity and weight bearing, trendelenberg gait, pain that interfers with activities of daily living, pain with passive range of motion, and crepitus. Patient has evidence of subchondral cysts, subchondral sclerosis, periarticular osteophytes, and joint space narrowing by imaging studies. This condition presents safety issues increasing the risk of falls. .  There is no current active infection.  We have received preop cardiac clearance  Patient Active Problem List   Diagnosis Date Noted   Unilateral primary osteoarthritis, left hip 11/27/2021   Acute cough 11/06/2021   Annual physical exam 07/01/2021   Skin mole 07/01/2021   Seasonal allergies 07/19/2019   Peripheral polyneuropathy 11/24/2017   Vitamin D deficiency 06/22/2016   Varicose veins 03/16/2014   Tinea pedis 11/03/2013   OA (osteoarthritis) 11/03/2013   Osteoporosis 12/09/2012   Itching 12/09/2012   PTTD (posterior tibial tendon dysfunction) 09/01/2012   Neuropathy, leg 04/29/2012   Rotator cuff syndrome of right shoulder 11/26/2011   COPD, mild ([place]) 10/22/2008   ESSENTIAL HYPERTENSION, BENIGN 09/25/2008   Osteoarthritis of shoulder 06/12/2008   HEPATIC CYST 03/16/2006   DEGENERATION, DISC NOS 03/16/2006   Past Medical History:   Diagnosis Date   Arthritis    Shoulder   Compression fracture of L4 lumbar vertebra    Pt did not have pain, osteoporosis   COPD (chronic obstructive pulmonary disease) ([place]) 03/02/2008   PFT   Hepatic cyst    Hypertension    Osteoporosis     Past Surgical History:  Procedure Laterality Date   ABDOMINAL HYSTERECTOMY     CATARACT EXTRACTION W/PHACO Right 08/23/2014   Procedure: CATARACT EXTRACTION PHACO AND INTRAOCULAR LENS PLACEMENT RIGHT EYE; CDE:  10.80;  Surgeon: Tonny Branch, MD;  Location: [facility];  Service: Ophthalmology;  Laterality: Right;   CATARACT EXTRACTION W/PHACO Left 10/08/2014   Procedure: CATARACT EXTRACTION PHACO AND INTRAOCULAR LENS PLACEMENT (IOC);  Surgeon: Tonny Branch, MD;  Location: [facility];  Service: Ophthalmology;  Laterality: Left;  CDE 6.60    Current Facility-Administered Medications  Medication Dose Route Frequency Provider Last Rate Last Admin   ceFAZolin (ANCEF) IVPB 2g/100 mL premix  2 g Intravenous On Call to OR Lanae Crumbly, PA-C       chlorhexidine (HIBICLENS) 4 % liquid 4 Application  60 mL Topical Once Benjiman Core M, PA-C       lactated ringers infusion   Intravenous Continuous Stoltzfus, March Rummage, DO 10 mL/hr at 01/07/22 1009 New Bag at 01/07/22 1009   No Known Allergies  Social History   Tobacco Use   Smoking status: Former    Types: Cigarettes   Smokeless tobacco: Never   Tobacco comments:    She quit smoking in January 2022. Smoked for 20 years, a pack usually last her a week.  Substance Use Topics   Alcohol use: No  Family History  Problem Relation Age of Onset   Diabetes Mother    Heart disease Father    Hypertension Sister    Hypertension Sister    Hypertension Sister    Hypertension Brother    Diabetes Mellitus II Son    Arthritis Other    Breast cancer Neg Hx    Colon cancer Neg Hx    Cancer - Lung Neg Hx      Review of Systems  Constitutional:  Positive for activity change.  HENT: Negative.    Respiratory: Negative.     Gastrointestinal: Negative.   Genitourinary: Negative.   Musculoskeletal:  Positive for gait problem.  Psychiatric/Behavioral: Negative.      Objective:  Physical Exam Constitutional:      Appearance: Normal appearance.  HENT:     Head: Normocephalic and atraumatic.     Nose: Nose normal.  Eyes:     Extraocular Movements: Extraocular movements intact.  Cardiovascular:     Rate and Rhythm: Normal rate and regular rhythm.  Pulmonary:     Effort: Pulmonary effort is normal. No respiratory distress.     Breath sounds: Normal breath sounds.  Abdominal:     General: Bowel sounds are normal. There is no distension.     Tenderness: There is no abdominal tenderness.  Neurological:     Mental Status: She is alert and oriented to person, place, and time.  Psychiatric:        Mood and Affect: Mood normal.     Vital signs in last 24 hours: Temp:  [98.1 F (36.7 C)] 98.1 F (36.7 C) (11/08 0955) Pulse Rate:  [55] 55 (11/08 0955) Resp:  [18] 18 (11/08 0955) BP: (130)/(67) 130/67 (11/08 0955) SpO2:  [98 %] 98 % (11/08 0955) Weight:  [74.4 kg] 74.4 kg (11/08 0955)  Labs:   Estimated body mass index is 28.15 kg/m as calculated from the following:   Height as of this encounter: '5\' 4"'$  (1.626 m).   Weight as of this encounter: 74.4 kg.   Imaging Review Plain radiographs demonstrate moderate degenerative joint disease of the left hip(s). The bone quality appears to be good for age and reported activity level.      Assessment/Plan:  End stage arthritis, left hip(s)  The patient history, physical examination, clinical judgement of the provider and imaging studies are consistent with end stage degenerative joint disease of the left hip(s) and total hip arthroplasty is deemed medically necessary. The treatment options including medical management, injection therapy, arthroscopy and arthroplasty were discussed at length. The risks and benefits of total hip arthroplasty were  presented and reviewed. The risks due to aseptic loosening, infection, stiffness, dislocation/subluxation,  thromboembolic complications and other imponderables were discussed.  The patient acknowledged the explanation, agreed to proceed with the plan and consent was signed. Patient is being admitted for inpatient treatment for surgery, pain control, PT, OT, prophylactic antibiotics, VTE prophylaxis, progressive ambulation and ADL's and discharge planning.The patient is planning to be discharged home with home health services

## 2022-01-07 NOTE — Anesthesia Procedure Notes (Signed)
Spinal  Patient location during procedure: OR Start time: 01/07/2022 12:31 PM End time: 01/07/2022 12:36 PM Reason for block: surgical anesthesia Staffing Performed: anesthesiologist  Anesthesiologist: Myrtie Soman, MD Performed by: Myrtie Soman, MD Authorized by: Myrtie Soman, MD   Preanesthetic Checklist Completed: patient identified, IV checked, site marked, risks and benefits discussed, surgical consent, monitors and equipment checked, pre-op evaluation and timeout performed Spinal Block Patient position: sitting Prep: Betadine Patient monitoring: heart rate, continuous pulse ox and blood pressure Approach: midline Location: L3-4 Injection technique: single-shot Needle Needle type: Sprotte  Needle gauge: 24 G Needle length: 9 cm Assessment Sensory level: T6 Events: CSF return Additional Notes

## 2022-01-07 NOTE — Op Note (Signed)
Pre and postop diagnosis: Left hip primary osteoarthritis  Procedure right total of arthroplasty, direct anterior approach  Surgeon: Lorin Mercy MD  Assistant: Benjiman Core, PA-C medically necessary and present for the entire procedure  Anesthesia spinal plus Exparel and local temples 10.  EBL: Less than 100 cc  Implants:Implants  CUP SECTOR GRIPTON 50MM - [id number]  Inventory Item: CUP SECTOR GRIPTON 50MM Serial no.: Model/Cat no.: [id number]  Implant name: CUP SECTOR GRIPTON 50MM - [id number] Laterality: Left Area: Hip  Manufacturer: [place] Date of Manufacture:   Action: Implanted Number Used: 1   Device Identifier: Device Identifier TypeBrock Bad APEX DEPUY - [id number]  Inventory Item: ELIMINATOR HOLE APEX DEPUY Serial no.: Model/Cat no.: [id number]  Implant nameBrock Bad APEX DEPUY - [id number] Laterality: Left Area: Hip  Manufacturer: [place] Date of Manufacture:   Action: Implanted Number Used: 1   Device Identifier: Device Identifier Type:   PINNACLE PLUS 4 NEUTRAL - [id number]  Inventory Item: PINNACLE PLUS 4 NEUTRAL Serial no.: Model/Cat no.: [id number]  Implant name: PINNACLE PLUS 4 NEUTRAL - [id number] Laterality: Left Area: Hip  Manufacturer: [place] Date of Manufacture:   Action: Implanted Number Used: 1   Device Identifier: Device Identifier Type:   STEM FEMORAL SZ5 HIGH ACTIS - [id number]  Inventory Item: STEM FEMORAL SZ5 HIGH ACTIS Serial no.: Model/Cat no.: [id number]  Implant name: STEM FEMORAL SZ5 HIGH ACTIS - [id number] Laterality: Left Area: Hip  Manufacturer: [place] Date of Manufacture:   Action: Implanted Number Used: 1   Device Identifier: Device Identifier Type:   HEAD FEM STD 32X+1 STRL - [id number]   Inventory Item: HEAD FEM STD 32X+1 STRL Serial no.: Model/Cat no.: [id number]  Implant name: HEAD FEM STD 32X+1 STRL - [id number] Laterality: Left Area: Hip  Manufacturer: [place]  Date of Manufacture:   Action: Implanted Number Used: 1   Device Identifier: Device Identifier Type:   DePuy high offset 50 mm cup +4 polyliner.  +1.  Millimeter metal ball.  Femoral size 5 Actis high offset.  Procedure: After induction of anesthesia Foley catheter placement Hana boots patient placed on the Hana table careful padding positioning C-arm was brought in good visualization of both hips.  1015 drapes were applied stomach was taped over.  1015 drapes applied prepping with DuraPrep after sterile skin marker large shower curtain Betadine Steri-Drape application and then more 1015 drapes.  I proceeded across and above timeout procedure hydraulic arm was applied after cutting all discarding the scissors and ceiling the base.  Oblique incision was made 2 fingerbreadths lateral 1 inferior to the ASIS obliquely to the trochanter fascia was nicked extended along the skin incision and then developed medially.  Blunt cobra placed over the top of the capsule and Hibbs retractor laterally capsule was thickened there was some rice bodies with synovitis present.  The capsule was cut.  There was erosive changes in the femoral head and neck was cut under C-arm visualization 1 fingerbreadth of the lesser trochanter head was removed with some difficulty piece by piece since the corkscrew would not hold on the ligamentum was extremely thick and was not able to be cut in order to get the head out.  Finally head pieces were removed.  Lipase using the rondure and the large pituitary.  Sequential reaming to 4950 given excellent tight fit and barely had to be tapped for it was secure.  C arm showed it was down and secured centralizer hole was inserted and then the +4  mm liner was impacted after dilating it and it was secured.  Good Flexion and abduction confirmed by radiograph.  Hydraulic arm was placed hip was excellently rotated 120 taken down and under after releasing the posterior capsule.  Sequential cookie cutter  curettes rongeurs and then broaching gradually up to #5.  #3 we stop checked left AP lateral with the broach and trials.  It appeared that high offset was needed to restore identical offset and we progressed in #5 which gave an excellent fill of the canal.  I was inserted high offset trials and a +1 mm ball was inserted and impacted and then hip was reduced.  External rotation 90 degrees leg taken taken halfway down to the floor with good stability.  Copious irrigation.  Leg length was restored her hip was short preop surprisingly with the rotation of the head and had been lengthened some but still remains slightly short comparison the opposite hip.  Copious irrigation inspection for bleeders.  Transverse bleeders were well coagulated.  V-Loc closure of the fascia to in the subcutaneous tissues skin staple closure postop dressing and transferred to care room.

## 2022-01-07 NOTE — Transfer of Care (Signed)
Immediate Anesthesia Transfer of Care Note  Patient: Alexis Davis  Procedure(s) Performed: LEFT TOTAL HIP ARTHROPLASTY ANTERIOR APPROACH (Left: Hip)  Patient Location: PACU  Anesthesia Type:Spinal  Level of Consciousness: awake  Airway & Oxygen Therapy: Patient Spontanous Breathing and Patient connected to face mask oxygen  Post-op Assessment: Report given to RN and Post -op Vital signs reviewed and stable  Post vital signs: Reviewed and stable  Last Vitals:  Vitals Value Taken Time  BP 101/64   Temp    Pulse 61 01/07/22 1424  Resp 12   SpO2 100 % 01/07/22 1424  Vitals shown include unvalidated device data.  Last Pain:  Vitals:   01/07/22 1000  TempSrc:   PainSc: 8       Patients Stated Pain Goal: 1 (82/42/35 3614)  Complications: No notable events documented.

## 2022-01-07 NOTE — Anesthesia Procedure Notes (Signed)
Procedure Name: MAC Date/Time: 01/07/2022 12:33 PM  Performed by: Niel Hummer, CRNAPre-anesthesia Checklist: Patient identified, Emergency Drugs available, Suction available and Patient being monitored Oxygen Delivery Method: Simple face mask

## 2022-01-07 NOTE — Anesthesia Postprocedure Evaluation (Signed)
Anesthesia Post Note  Patient: AMESHA BAILEY  Procedure(s) Performed: LEFT TOTAL HIP ARTHROPLASTY ANTERIOR APPROACH (Left: Hip)     Patient location during evaluation: PACU Anesthesia Type: Spinal Level of consciousness: oriented and awake and alert Pain management: pain level controlled Vital Signs Assessment: post-procedure vital signs reviewed and stable Respiratory status: spontaneous breathing, respiratory function stable and patient connected to nasal cannula oxygen Cardiovascular status: blood pressure returned to baseline and stable Postop Assessment: no headache, no backache and no apparent nausea or vomiting Anesthetic complications: no  No notable events documented.  Last Vitals:  Vitals:   01/07/22 1515 01/07/22 1530  BP: 125/66 127/71  Pulse: (!) 45 (!) 47  Resp: 17 17  Temp:    SpO2: 97% 97%    Last Pain:  Vitals:   01/07/22 1515  TempSrc:   PainSc: 0-No pain                 Erik Nessel S

## 2022-01-07 NOTE — Evaluation (Signed)
Physical Therapy Evaluation Patient Details Name: Alexis Davis MRN: [id number] DOB: 07-06-1940 Today's Date: 01/07/2022  History of Present Illness  81 y.o. female presents to [facility] on 01/07/2022 for elective L THA. PMH includes OA, osteoporosis, HTN.  Clinical Impression  Pt presents to PT with deficits in strength, power, gait, balance, endurance, sensation. Pt mobilizing well at this time, with one loss of balance immediately after ascending into standing which she attributes to some numbness post-surgery in both feet. Once ambulating pt demonstrates good balance. PT provides education on THA exercises, with handout provided. Pt will benefit from aggressive mobilization in an effort to return to a modI level of mobility.       Recommendations for follow up therapy are one component of a multi-disciplinary discharge planning process, led by the attending physician.  Recommendations may be updated based on patient status, additional functional criteria and insurance authorization.  Follow Up Recommendations Follow physician's recommendations for discharge plan and follow up therapies      Assistance Recommended at Discharge Intermittent Supervision/Assistance  Patient can return home with the following  A little help with walking and/or transfers;A lot of help with bathing/dressing/bathroom;Assistance with cooking/housework;Assist for transportation;Help with stairs or ramp for entrance    Equipment Recommendations None recommended by PT  Recommendations for Other Services       Functional Status Assessment Patient has had a recent decline in their functional status and demonstrates the ability to make significant improvements in function in a reasonable and predictable amount of time.     Precautions / Restrictions Precautions Precautions: Fall Restrictions Weight Bearing Restrictions: No      Mobility  Bed Mobility Overal bed mobility: Needs Assistance Bed  Mobility: Supine to Sit     Supine to sit: Supervision     General bed mobility comments: increased time    Transfers Overall transfer level: Needs assistance Equipment used: Rolling walker (2 wheels) Transfers: Sit to/from Stand Sit to Stand: Mod assist           General transfer comment: pt with lateral loss of balance to right side once ascending into standing position    Ambulation/Gait Ambulation/Gait assistance: Min guard Gait Distance (Feet): 12 Feet Assistive device: Rolling walker (2 wheels) Gait Pattern/deviations: Step-through pattern Gait velocity: reduced Gait velocity interpretation: <1.31 ft/sec, indicative of household ambulator   General Gait Details: slowed step-through gait, reduced stance time on LLE  Stairs            Wheelchair Mobility    Modified Rankin (Stroke Patients Only)       Balance Overall balance assessment: Needs assistance Sitting-balance support: No upper extremity supported, Feet supported Sitting balance-Leahy Scale: Good     Standing balance support: Bilateral upper extremity supported, Reliant on assistive device for balance Standing balance-Leahy Scale: Poor                               Pertinent Vitals/Pain Pain Assessment Pain Assessment: 0-10 Pain Score: 8  Pain Location: L hip Pain Descriptors / Indicators: Aching Pain Intervention(s): Premedicated before session    Home Living Family/patient expects to be discharged to:: Private residence Living Arrangements: Spouse/significant other Available Help at Discharge: Family;Available 24 hours/day Type of Home: House Home Access: Stairs to enter Entrance Stairs-Rails: Can reach both Entrance Stairs-Number of Steps: 5   Home Layout: One level Home Equipment: Conservation officer, nature (2 wheels)      Prior Function Prior  Level of Function : Independent/Modified Independent;Driving             Mobility Comments: ambulatory with use of RW        Hand Dominance        Extremity/Trunk Assessment   Upper Extremity Assessment Upper Extremity Assessment: Overall WFL for tasks assessed    Lower Extremity Assessment Lower Extremity Assessment: Generalized weakness;LLE deficits/detail LLE Deficits / Details: grossly 4-/5    Cervical / Trunk Assessment Cervical / Trunk Assessment: Normal  Communication   Communication: No difficulties  Cognition Arousal/Alertness: Awake/alert Behavior During Therapy: WFL for tasks assessed/performed Overall Cognitive Status: Within Functional Limits for tasks assessed                                          General Comments General comments (skin integrity, edema, etc.): VSS on RA    Exercises Total Joint Exercises Ankle Circles/Pumps: AROM, Left, 10 reps Quad Sets: AROM, Left, 5 reps Heel Slides:  (PT provides instruction) Hip ABduction/ADduction:  (PT provides instruction) Straight Leg Raises:  (PT provides instruction)   Assessment/Plan    PT Assessment Patient needs continued PT services  PT Problem List Decreased strength;Decreased activity tolerance;Decreased balance;Decreased mobility;Pain       PT Treatment Interventions DME instruction;Gait training;Stair training;Functional mobility training;Therapeutic activities;Therapeutic exercise;Balance training;Neuromuscular re-education;Patient/family education    PT Goals (Current goals can be found in the Care Plan section)  Acute Rehab PT Goals Patient Stated Goal: to return to independence PT Goal Formulation: With patient Time For Goal Achievement: 01/21/22 Potential to Achieve Goals: Good    Frequency 7X/week     Co-evaluation               AM-PAC PT "6 Clicks" Mobility  Outcome Measure Help needed turning from your back to your side while in a flat bed without using bedrails?: A Little Help needed moving from lying on your back to sitting on the side of a flat bed without using  bedrails?: A Little Help needed moving to and from a bed to a chair (including a wheelchair)?: A Little Help needed standing up from a chair using your arms (e.g., wheelchair or bedside chair)?: A Lot Help needed to walk in hospital room?: A Little Help needed climbing 3-5 steps with a railing? : A Lot 6 Click Score: 16    End of Session   Activity Tolerance: Patient tolerated treatment well Patient left: in chair;with call bell/phone within reach;with chair alarm set;with family/visitor present Nurse Communication: Mobility status PT Visit Diagnosis: Other abnormalities of gait and mobility (R26.89)    Time: 1712-1730 PT Time Calculation (min) (ACUTE ONLY): 18 min   Charges:   PT Evaluation $PT Eval Low Complexity: 1 Low         Zenaida Niece, PT, DPT Acute Rehabilitation Office [phone]   Zenaida Niece 01/07/2022, 5:43 PM

## 2022-01-08 ENCOUNTER — Encounter (HOSPITAL_COMMUNITY): Payer: Self-pay | Admitting: Orthopaedic Surgery

## 2022-01-08 DIAGNOSIS — I10 Essential (primary) hypertension: Secondary | ICD-10-CM | POA: Diagnosis not present

## 2022-01-08 DIAGNOSIS — M1612 Unilateral primary osteoarthritis, left hip: Secondary | ICD-10-CM | POA: Diagnosis not present

## 2022-01-08 DIAGNOSIS — Z87891 Personal history of nicotine dependence: Secondary | ICD-10-CM | POA: Diagnosis not present

## 2022-01-08 DIAGNOSIS — J449 Chronic obstructive pulmonary disease, unspecified: Secondary | ICD-10-CM | POA: Diagnosis not present

## 2022-01-08 LAB — CBC
HCT: 36 % (ref 36.0–46.0)
Hemoglobin: 12.5 g/dL (ref 12.0–15.0)
MCH: 30.8 pg (ref 26.0–34.0)
MCHC: 34.7 g/dL (ref 30.0–36.0)
MCV: 88.7 fL (ref 80.0–100.0)
Platelets: 208 10*3/uL (ref 150–400)
RBC: 4.06 MIL/uL (ref 3.87–5.11)
RDW: 13 % (ref 11.5–15.5)
WBC: 14.4 10*3/uL — ABNORMAL HIGH (ref 4.0–10.5)
nRBC: 0 % (ref 0.0–0.2)

## 2022-01-08 LAB — BASIC METABOLIC PANEL
Anion gap: 9 (ref 5–15)
BUN: 7 mg/dL — ABNORMAL LOW (ref 8–23)
CO2: 24 mmol/L (ref 22–32)
Calcium: 9.3 mg/dL (ref 8.9–10.3)
Chloride: 100 mmol/L (ref 98–111)
Creatinine, Ser: 0.58 mg/dL (ref 0.44–1.00)
GFR, Estimated: >60 mL/min (ref >60)
Glucose, Bld: 130 mg/dL — ABNORMAL HIGH (ref 70–99)
Potassium: 3.6 mmol/L (ref 3.5–5.1)
Sodium: 133 mmol/L — ABNORMAL LOW (ref 135–145)

## 2022-01-08 MED ORDER — HYDROCODONE-ACETAMINOPHEN 10-325 MG PO TABS
1.0000 | ORAL_TABLET | ORAL | Status: DC | PRN
  Administered 2022-01-08 – 2022-01-09 (×3): 1 via ORAL
  Filled 2022-01-08 (×3): qty 1

## 2022-01-08 NOTE — Progress Notes (Signed)
Physical Therapy Treatment Patient Details Name: Alexis Davis MRN: [id number] DOB: 12-24-1940 Today's Date: 01/08/2022   History of Present Illness 81 y.o. female presents to [facility] hospital on 01/07/2022 for elective L THA. PMH includes OA, osteoporosis, HTN.    PT Comments    Patient making excellent progress with mobility and demonstrates good use of RW. Supervision for transfers and spouse provided safe guarding with gait for ~250'. No LOB and pt reports reduced stiffness after ambulation. EOS reviewed standing HEP for ROM and strengthening. Educated on use of belt to assist Lt LE onto bed for supine<>sit transfers. Pt will continue to benefit from skilled PT interventions to progress mobility.    Recommendations for follow up therapy are one component of a multi-disciplinary discharge planning process, led by the attending physician.  Recommendations may be updated based on patient status, additional functional criteria and insurance authorization.  Follow Up Recommendations  Follow physician's recommendations for discharge plan and follow up therapies     Assistance Recommended at Discharge Intermittent Supervision/Assistance  Patient can return home with the following A little help with walking and/or transfers;A lot of help with bathing/dressing/bathroom;Assistance with cooking/housework;Assist for transportation;Help with stairs or ramp for entrance   Equipment Recommendations  None recommended by PT    Recommendations for Other Services       Precautions / Restrictions Precautions Precautions: Fall Restrictions Weight Bearing Restrictions: No     Mobility  Bed Mobility Overal bed mobility: Needs Assistance Bed Mobility: Sit to Supine     Supine to sit: Supervision Sit to supine: Min guard   General bed mobility comments: cues for use of belt to bring Lt LE onto bed, guarding for safety.    Transfers Overall transfer level: Needs assistance Equipment used:  Rolling walker (2 wheels) Transfers: Sit to/from Stand Sit to Stand: Supervision           General transfer comment: supervision for safety with rise from recliner and EOB. no assist or cues needed.    Ambulation/Gait Ambulation/Gait assistance: Min guard, Supervision Gait Distance (Feet): 250 Feet Assistive device: Rolling walker (2 wheels) Gait Pattern/deviations: Step-through pattern Gait velocity: fair     General Gait Details: pt steady throughout, maintained safe walker management and spouse provided guarding. no LOB.   Stairs Stairs: Yes Stairs assistance: Min guard Stair Management: Two rails, Step to pattern, Forwards Number of Stairs: 3 General stair comments: cues for step sequencing, pt's spouse provided guarding and no LOB noted.   Wheelchair Mobility    Modified Rankin (Stroke Patients Only)       Balance Overall balance assessment: Needs assistance Sitting-balance support: No upper extremity supported, Feet supported Sitting balance-Leahy Scale: Good     Standing balance support: Bilateral upper extremity supported, Reliant on assistive device for balance Standing balance-Leahy Scale: Fair                              Cognition Arousal/Alertness: Awake/alert Behavior During Therapy: WFL for tasks assessed/performed Overall Cognitive Status: Within Functional Limits for tasks assessed                                          Exercises Total Joint Exercises Ankle Circles/Pumps: AROM, Left, 10 reps Quad Sets: AROM, Left, 10 reps Gluteal Sets: AROM, Left, 10 reps, Standing, Limitations Gluteal Sets Limitations: hip extension  Heel Slides: AAROM, Left, 10 reps Hip ABduction/ADduction: AROM, Left, 10 reps, Standing Long Arc Quad: AROM, Left, 10 reps Knee Flexion: AROM, Left, 10 reps, Standing Marching in Standing: AROM, Left, 10 reps, Standing    General Comments        Pertinent Vitals/Pain Pain  Assessment Pain Assessment: Faces Faces Pain Scale: Hurts a little bit Pain Location: L hip Pain Descriptors / Indicators: Aching, Burning Pain Intervention(s): Limited activity within patient's tolerance, Monitored during session, Premedicated before session, Repositioned, Ice applied    Home Living                          Prior Function            PT Goals (current goals can now be found in the care plan section) Acute Rehab PT Goals Patient Stated Goal: to return to independence PT Goal Formulation: With patient Time For Goal Achievement: 01/21/22 Potential to Achieve Goals: Good Progress towards PT goals: Progressing toward goals    Frequency    7X/week      PT Plan Current plan remains appropriate    Co-evaluation              AM-PAC PT "6 Clicks" Mobility   Outcome Measure  Help needed turning from your back to your side while in a flat bed without using bedrails?: A Little Help needed moving from lying on your back to sitting on the side of a flat bed without using bedrails?: A Little Help needed moving to and from a bed to a chair (including a wheelchair)?: A Little Help needed standing up from a chair using your arms (e.g., wheelchair or bedside chair)?: A Little Help needed to walk in hospital room?: A Little Help needed climbing 3-5 steps with a railing? : A Little 6 Click Score: 18    End of Session Equipment Utilized During Treatment: Gait belt Activity Tolerance: Patient tolerated treatment well Patient left: in chair;with call bell/phone within reach;with chair alarm set;with family/visitor present Nurse Communication: Mobility status PT Visit Diagnosis: Other abnormalities of gait and mobility (R26.89)     Time: 5409-8119 PT Time Calculation (min) (ACUTE ONLY): 17 min  Charges:  $Gait Training: 8-22 mins $Therapeutic Exercise: 8-22 mins                     Verner Mould, DPT Acute Rehabilitation Services Office  [phone]  01/08/22 3:41 PM

## 2022-01-08 NOTE — Care Management Obs Status (Signed)
[facility] NOTIFICATION   Patient Details  Name: Alexis Davis MRN: [id number] Date of Birth: 1940-10-12   Medicare Observation Status Notification Given:  Yes    Joanne Chars, LCSW 01/08/2022, 3:51 PM

## 2022-01-08 NOTE — Progress Notes (Signed)
Subjective: Patient seen this morning.  Doing well.  Pain controlled.  Family members present.    Objective: Vital signs in last 24 hours: Temp:  [97.3 F (36.3 C)-99.6 F (37.6 C)] 97.9 F (36.6 C) (11/09 0814) Pulse Rate:  [45-102] 86 (11/09 0814) Resp:  [11-20] 17 (11/09 0814) BP: (101-142)/(58-82) 126/74 (11/09 0814) SpO2:  [94 %-100 %] 98 % (11/09 0814)  Intake/Output from previous day: 11/08 0701 - 11/09 0700 In: 850 [I.V.:650; IV Piggyback:200] Out: 1850 [Urine:1700; Blood:150] Intake/Output this shift: Total I/O In: -  Out: 300 [Urine:300]  Recent Labs    01/08/22 0426  HGB 12.5   Recent Labs    01/08/22 0426  WBC 14.4*  RBC 4.06  HCT 36.0  PLT 208   Recent Labs    01/08/22 0426  NA 133*  K 3.6  CL 100  CO2 24  BUN 7*  CREATININE 0.58  GLUCOSE 130*  CALCIUM 9.3   No results for input(s): "LABPT", "INR" in the last 72 hours.  Exam: Pleasant elderly female, alert and oriented, NAD.  Dressing clean, dry, intact.  Calf nontender.     Assessment/Plan: Continue present care.  D/c dilaudid.  Possible d/c home tomorrow depending on mobility and pain control.      Benjiman Core 01/08/2022, 11:15 AM

## 2022-01-08 NOTE — Plan of Care (Signed)
  Problem: Education: Goal: Knowledge of General Education information will improve Description: Including pain rating scale, medication(s)/side effects and non-pharmacologic comfort measures 01/08/2022 0616 by Delton See, RN Outcome: Progressing 01/08/2022 0616 by Delton See, RN Outcome: Progressing   Problem: Health Behavior/Discharge Planning: Goal: Ability to manage health-related needs will improve 01/08/2022 0616 by Delton See, RN Outcome: Progressing 01/08/2022 0616 by Delton See, RN Outcome: Progressing   Problem: Clinical Measurements: Goal: Ability to maintain clinical measurements within normal limits will improve 01/08/2022 0616 by Delton See, RN Outcome: Progressing 01/08/2022 0616 by Delton See, RN Outcome: Progressing Goal: Will remain free from infection 01/08/2022 0616 by Delton See, RN Outcome: Progressing 01/08/2022 0616 by Delton See, RN Outcome: Progressing Goal: Diagnostic test results will improve Outcome: Progressing Goal: Respiratory complications will improve Outcome: Progressing Goal: Cardiovascular complication will be avoided Outcome: Progressing   Problem: Activity: Goal: Risk for activity intolerance will decrease Outcome: Progressing   Problem: Nutrition: Goal: Adequate nutrition will be maintained Outcome: Progressing   Problem: Coping: Goal: Level of anxiety will decrease Outcome: Progressing   Problem: Elimination: Goal: Will not experience complications related to bowel motility Outcome: Progressing Goal: Will not experience complications related to urinary retention Outcome: Progressing   Problem: Pain Managment: Goal: General experience of comfort will improve Outcome: Progressing   Problem: Safety: Goal: Ability to remain free from injury will improve Outcome: Progressing   Problem: Skin Integrity: Goal: Risk for impaired skin integrity will decrease Outcome: Progressing   Problem: Education: Goal: Knowledge of the  prescribed therapeutic regimen will improve Outcome: Progressing Goal: Understanding of discharge needs will improve Outcome: Progressing Goal: Individualized Educational Video(s) Outcome: Progressing   Problem: Activity: Goal: Ability to avoid complications of mobility impairment will improve Outcome: Progressing Goal: Ability to tolerate increased activity will improve Outcome: Progressing   Problem: Clinical Measurements: Goal: Postoperative complications will be avoided or minimized Outcome: Progressing   Problem: Pain Management: Goal: Pain level will decrease with appropriate interventions Outcome: Progressing   Problem: Skin Integrity: Goal: Will show signs of wound healing Outcome: Progressing

## 2022-01-08 NOTE — Progress Notes (Signed)
Physical Therapy Treatment Patient Details Name: Alexis Davis MRN: [id number] DOB: Aug 28, 1940 Today's Date: 01/08/2022   History of Present Illness 81 y.o. female presents to [facility] hospital on 01/07/2022 for elective L THA. PMH includes OA, osteoporosis, HTN.    PT Comments    Patient making good progress with mobility. Pt demonstrated safe use of RW for transfers and gait. Ambulated ~230' with RW and no LOB noted and pt's spouse present providing safe guarding with cues and supervision from therapist. Pt initiated stair training with bil hand rail, no LOB noted and safe sequencing. Reviewed HEP for ROM and strengthening. Will continue to progress pt as able in acute setting.   Recommendations for follow up therapy are one component of a multi-disciplinary discharge planning process, led by the attending physician.  Recommendations may be updated based on patient status, additional functional criteria and insurance authorization.  Follow Up Recommendations  Follow physician's recommendations for discharge plan and follow up therapies     Assistance Recommended at Discharge Intermittent Supervision/Assistance  Patient can return home with the following A little help with walking and/or transfers;A lot of help with bathing/dressing/bathroom;Assistance with cooking/housework;Assist for transportation;Help with stairs or ramp for entrance   Equipment Recommendations  None recommended by PT    Recommendations for Other Services       Precautions / Restrictions Precautions Precautions: Fall Restrictions Weight Bearing Restrictions: No     Mobility  Bed Mobility Overal bed mobility: Needs Assistance Bed Mobility: Supine to Sit     Supine to sit: Supervision     General bed mobility comments: increased time, HOB slightly elevated    Transfers Overall transfer level: Needs assistance Equipment used: Rolling walker (2 wheels) Transfers: Sit to/from Stand Sit to Stand: Min  guard           General transfer comment: guarding for safety, pt able to complete rise with use of bil UE and steady once standing.    Ambulation/Gait Ambulation/Gait assistance: Min guard, Supervision Gait Distance (Feet): 250 Feet Assistive device: Rolling walker (2 wheels) Gait Pattern/deviations: Step-through pattern Gait velocity: reduced     General Gait Details: cues to maintain safe position to RW and for step through pattern "push like a shopping cart". no buckling or LOB. pt's spouse present and provided guarding with cues and supervision from therapist.   Stairs Stairs: Yes Stairs assistance: Min guard Stair Management: Two rails, Step to pattern, Forwards Number of Stairs: 3 General stair comments: cues for step sequencing, pt's spouse provided guarding and no LOB noted.   Wheelchair Mobility    Modified Rankin (Stroke Patients Only)       Balance Overall balance assessment: Needs assistance Sitting-balance support: No upper extremity supported, Feet supported Sitting balance-Leahy Scale: Good     Standing balance support: Bilateral upper extremity supported, Reliant on assistive device for balance Standing balance-Leahy Scale: Fair                              Cognition Arousal/Alertness: Awake/alert Behavior During Therapy: WFL for tasks assessed/performed Overall Cognitive Status: Within Functional Limits for tasks assessed                                          Exercises Total Joint Exercises Ankle Circles/Pumps: AROM, Left, 10 reps Quad Sets: AROM, Left, 10 reps Heel Slides:  AAROM, Left, 10 reps Long Arc Quad: AROM, Left, 10 reps    General Comments        Pertinent Vitals/Pain Pain Assessment Pain Assessment: Faces Faces Pain Scale: Hurts a little bit Pain Location: L hip Pain Descriptors / Indicators: Aching, Burning Pain Intervention(s): Monitored during session, Limited activity within patient's  tolerance, Repositioned, Ice applied    Home Living                          Prior Function            PT Goals (current goals can now be found in the care plan section) Acute Rehab PT Goals Patient Stated Goal: to return to independence PT Goal Formulation: With patient Time For Goal Achievement: 01/21/22 Potential to Achieve Goals: Good Progress towards PT goals: Progressing toward goals    Frequency    7X/week      PT Plan Current plan remains appropriate    Co-evaluation              AM-PAC PT "6 Clicks" Mobility   Outcome Measure  Help needed turning from your back to your side while in a flat bed without using bedrails?: A Little Help needed moving from lying on your back to sitting on the side of a flat bed without using bedrails?: A Little Help needed moving to and from a bed to a chair (including a wheelchair)?: A Little Help needed standing up from a chair using your arms (e.g., wheelchair or bedside chair)?: A Little Help needed to walk in hospital room?: A Little Help needed climbing 3-5 steps with a railing? : A Little 6 Click Score: 18    End of Session Equipment Utilized During Treatment: Gait belt Activity Tolerance: Patient tolerated treatment well Patient left: in chair;with call bell/phone within reach;with chair alarm set;with family/visitor present Nurse Communication: Mobility status PT Visit Diagnosis: Other abnormalities of gait and mobility (R26.89)     Time: 5329-9242 PT Time Calculation (min) (ACUTE ONLY): 26 min  Charges:  $Gait Training: 8-22 mins $Therapeutic Exercise: 8-22 mins                     Verner Mould, DPT Acute Rehabilitation Services Office [phone]  01/08/22 2:22 PM

## 2022-01-09 ENCOUNTER — Observation Stay (HOSPITAL_COMMUNITY): Payer: Medicare Other

## 2022-01-09 DIAGNOSIS — J449 Chronic obstructive pulmonary disease, unspecified: Secondary | ICD-10-CM | POA: Diagnosis not present

## 2022-01-09 DIAGNOSIS — I10 Essential (primary) hypertension: Secondary | ICD-10-CM | POA: Diagnosis not present

## 2022-01-09 DIAGNOSIS — M1612 Unilateral primary osteoarthritis, left hip: Secondary | ICD-10-CM | POA: Diagnosis not present

## 2022-01-09 DIAGNOSIS — R918 Other nonspecific abnormal finding of lung field: Secondary | ICD-10-CM | POA: Diagnosis not present

## 2022-01-09 DIAGNOSIS — R509 Fever, unspecified: Secondary | ICD-10-CM | POA: Diagnosis not present

## 2022-01-09 DIAGNOSIS — Z87891 Personal history of nicotine dependence: Secondary | ICD-10-CM | POA: Diagnosis not present

## 2022-01-09 DIAGNOSIS — R911 Solitary pulmonary nodule: Secondary | ICD-10-CM | POA: Diagnosis not present

## 2022-01-09 LAB — URINALYSIS, ROUTINE W REFLEX MICROSCOPIC
Bacteria, UA: NONE SEEN
Bilirubin Urine: NEGATIVE
Glucose, UA: NEGATIVE mg/dL
Ketones, ur: NEGATIVE mg/dL
Leukocytes,Ua: NEGATIVE
Nitrite: NEGATIVE
Protein, ur: NEGATIVE mg/dL
Specific Gravity, Urine: 1.012 (ref 1.005–1.030)
pH: 6 (ref 5.0–8.0)

## 2022-01-09 LAB — CBC
HCT: 33.3 % — ABNORMAL LOW (ref 36.0–46.0)
Hemoglobin: 11.6 g/dL — ABNORMAL LOW (ref 12.0–15.0)
MCH: 31 pg (ref 26.0–34.0)
MCHC: 34.8 g/dL (ref 30.0–36.0)
MCV: 89 fL (ref 80.0–100.0)
Platelets: 208 10*3/uL (ref 150–400)
RBC: 3.74 MIL/uL — ABNORMAL LOW (ref 3.87–5.11)
RDW: 13.1 % (ref 11.5–15.5)
WBC: 14.5 10*3/uL — ABNORMAL HIGH (ref 4.0–10.5)
nRBC: 0 % (ref 0.0–0.2)

## 2022-01-09 MED ORDER — ASPIRIN 325 MG PO TBEC: 325.0000 mg | DELAYED_RELEASE_TABLET | Freq: Every day | ORAL | 0 refills | Status: DC

## 2022-01-09 MED ORDER — HYDROCODONE-ACETAMINOPHEN 10-325 MG PO TABS: 1.0000 | ORAL_TABLET | ORAL | 0 refills | Status: DC | PRN

## 2022-01-09 MED ORDER — METHOCARBAMOL 500 MG PO TABS: 500.0000 mg | ORAL_TABLET | Freq: Four times a day (QID) | ORAL | 0 refills | Status: DC | PRN

## 2022-01-09 NOTE — Progress Notes (Signed)
Subjective: Doing well. C/o of some left hip pain but controlled with medication.  Ambulated 250 feet with PT.  Ready to go home.    Objective: Vital signs in last 24 hours: Temp:  [97.8 F (36.6 C)-99.4 F (37.4 C)] 99.4 F (37.4 C) (11/10 0754) Pulse Rate:  [70-80] 76 (11/10 0754) Resp:  [16-18] 16 (11/10 0754) BP: (115-130)/(55-64) 115/62 (11/10 0754) SpO2:  [96 %-97 %] 97 % (11/10 0754)  Intake/Output from previous day: 11/09 0701 - 11/10 0700 In: 2376.2 [P.O.:360; I.V.:2016.2] Out: 600 [Urine:600] Intake/Output this shift: No intake/output data recorded.  Recent Labs    01/08/22 0426 01/09/22 0359  HGB 12.5 11.6*   Recent Labs    01/08/22 0426 01/09/22 0359  WBC 14.4* 14.5*  RBC 4.06 3.74*  HCT 36.0 33.3*  PLT 208 208   Recent Labs    01/08/22 0426  NA 133*  K 3.6  CL 100  CO2 24  BUN 7*  CREATININE 0.58  GLUCOSE 130*  CALCIUM 9.3   No results for input(s): "LABPT", "INR" in the last 72 hours.  Exam: Very pleasant female, alert and oriented, NAD.  Wound looks good, staples intact, no signs of infection.  Left hip/thigh swelling, but soft. NVI.  No motor deficits.  Calf nontender.     Assessment/Plan: T99.4 and increased WBC.  Will get chest xray and check UA/urine culture.   Anticipate d/c home this afternoon after PT and if xray OK.      Benjiman Core 01/09/2022, 8:40 AM

## 2022-01-09 NOTE — Progress Notes (Signed)
Patient ID: Alexis Davis, female   DOB: 12-Dec-1940, 81 y.o.   MRN: [id number]   I reviewed chest xray ordered this morning and showed:  CLINICAL DATA:  Postop fever.   EXAM: PORTABLE CHEST 1 VIEW   COMPARISON:  07/17/2016   FINDINGS: The lungs are clear without focal pneumonia, edema, pneumothorax or pleural effusion. Tiny nodule seen in the lower right lung, superimposed on the posterior right seventh rib. Cardiopericardial silhouette is at upper limits of normal for size. The visualized bony structures of the thorax are unremarkable.   IMPRESSION: 1. No acute cardiopulmonary findings. 2. Tiny right lung nodule. CT chest without contrast recommended to further evaluate.     Electronically Signed   By: Misty Stanley M.D.   On: 01/09/2022 09:12    I ordered CT chest as recommended.  Discussed finding of xray with patient and I advised I wanted to get study done before d/c today.  She voices understanding.

## 2022-01-09 NOTE — TOC Transition Note (Addendum)
Transition of Care [facility]) - CM/SW Discharge Note   Patient Details  Name: Alexis Davis MRN: [id number] Date of Birth: [birth year]  Transition of Care [facility]) CM/SW Contact:  Sharin Mons, RN Phone Number: 01/09/2022, 2:37 PM   Clinical Narrative:    Patient will DC to: home Anticipated DC date: 01/09/2022 Family notified: yes Transport by: car            -s/p L THA , 01/07/2022 Per MD patient ready for DC today. RN, patient, patient's family, and [place] notified of DC. Family ( husband, daughter, son) to assist with care once once. Pt without DME needs.  Post hospital f/u noted on AVS. Pt without RX med concerns. Family to provide transportation to home.  RNCM will sign off for now as intervention is no longer needed. Please consult [place] again if new needs arise.   01/09/2022 1509 NCM received call from [place] @ [place] NCM provider d/c home health services. States pt doesn't need,NCM made pt  aware.  Final next level of care: [place]     Patient Goals and CMS Choice     Choice offered to / list presented to : Patient  Discharge Placement                       Discharge Plan and Services                                     Social Determinants of Health (SDOH) Interventions Food Insecurity Interventions: Intervention Not Indicated Housing Interventions: Intervention Not Indicated Transportation Interventions: Intervention Not Indicated Utilities Interventions: Intervention Not Indicated   Readmission Risk Interventions     No data to display

## 2022-01-09 NOTE — Progress Notes (Signed)
Pt discharged home with spouse and children in stable condition. Discharge instructions received. Pt verbalized understanding. No immediate questions or concerns at this time. Scripts sent to pharmacy of choice.

## 2022-01-09 NOTE — Discharge Instructions (Signed)
INSTRUCTIONS AFTER JOINT REPLACEMENT   Remove items at home which could result in a fall. This includes throw rugs or furniture in walking pathways ICE to the affected joint every three hours while awake for 30 minutes at a time, for at least the first 3-5 days, and then as needed for pain and swelling.  Continue to use ice for pain and swelling. You may notice swelling that will progress down to the foot and ankle.  This is normal after surgery.  Elevate your leg when you are not up walking on it.   Continue to use the breathing machine you got in the hospital (incentive spirometer) which will help keep your temperature down.  It is common for your temperature to cycle up and down following surgery, especially at night when you are not up moving around and exerting yourself.  The breathing machine keeps your lungs expanded and your temperature down.   DIET:  As you were doing prior to hospitalization, we recommend a well-balanced diet.  DRESSING / WOUND CARE / SHOWERING  Ok to shower but no tub soaking.  Daily dressing changes with gauze and tape.  Wash hands before dressing changes.  Do not apply any creams or ointments to incision.     ACTIVITY  Increase activity slowly as tolerated, but follow the weight bearing instructions below.   No driving for 6 weeks or until further direction given by your physician.  You cannot drive while taking narcotics.  No lifting or carrying greater than 10 lbs. until further directed by your surgeon. Avoid periods of inactivity such as sitting longer than an hour when not asleep. This helps prevent blood clots.  You may return to work once you are authorized by your doctor.     WEIGHT BEARING   Weight bearing as tolerated with assist device (walker, cane, etc) as directed, use it as long as suggested by your surgeon or therapist, typically at least 4-6 weeks.   EXERCISES  Results after joint replacement surgery are often greatly improved when you  follow the exercise, range of motion and muscle strengthening exercises prescribed by your doctor. Safety measures are also important to protect the joint from further injury. Any time any of these exercises cause you to have increased pain or swelling, decrease what you are doing until you are comfortable again and then slowly increase them. If you have problems or questions, call your caregiver or physical therapist for advice.   Rehabilitation is important following a joint replacement. After just a few days of immobilization, the muscles of the leg can become weakened and shrink (atrophy).  These exercises are designed to build up the tone and strength of the thigh and leg muscles and to improve motion. Often times heat used for twenty to thirty minutes before working out will loosen up your tissues and help with improving the range of motion but do not use heat for the first two weeks following surgery (sometimes heat can increase post-operative swelling).   A rehabilitation program following joint replacement surgery can speed recovery and prevent re-injury in the future due to weakened muscles. Contact your doctor or a physical therapist for more information on knee rehabilitation.    CONSTIPATION  Constipation is defined medically as fewer than three stools per week and severe constipation as less than one stool per week.  Even if you have a regular bowel pattern at home, your normal regimen is likely to be disrupted due to multiple reasons following surgery.  Combination  of anesthesia, postoperative narcotics, change in appetite and fluid intake all can affect your bowels.   YOU MUST use at least one of the following options; they are listed in order of increasing strength to get the job done.  They are all available over the counter, and you may need to use some, POSSIBLY even all of these options:    Drink plenty of fluids (prune juice may be helpful) and high fiber foods Colace 100 mg by  mouth twice a day  Senokot for constipation as directed and as needed Dulcolax (bisacodyl), take with full glass of water  Miralax (polyethylene glycol) once or twice a day as needed.  If you have tried all these things and are unable to have a bowel movement in the first 3-4 days after surgery call either your surgeon or your primary doctor.    If you experience loose stools or diarrhea, hold the medications until you stool forms back up.  If your symptoms do not get better within 1 week or if they get worse, check with your doctor.  If you experience "the worst abdominal pain ever" or develop nausea or vomiting, please contact the office immediately for further recommendations for treatment.   ITCHING:  If you experience itching with your medications, try taking only a single pain pill, or even half a pain pill at a time.  You can also use Benadryl over the counter for itching or also to help with sleep.   TED HOSE STOCKINGS:  Use stockings on both legs until for at least 2 weeks or as directed by physician office. They may be removed at night for sleeping.  MEDICATIONS:  See your medication summary on the "After Visit Summary" that nursing will review with you.  You may have some home medications which will be placed on hold until you complete the course of blood thinner medication.  It is important for you to complete the blood thinner medication as prescribed.  PRECAUTIONS:  If you experience chest pain or shortness of breath - call 911 immediately for transfer to the hospital emergency department.   If you develop a fever greater that 101 F, purulent drainage from wound, increased redness or drainage from wound, foul odor from the wound/dressing, or calf pain - CONTACT YOUR SURGEON.                                                   FOLLOW-UP APPOINTMENTS:  If you do not already have a post-op appointment, please call the office for an appointment to be seen by your surgeon.  Guidelines for  how soon to be seen are listed in your "After Visit Summary", but are typically between 1-4 weeks after surgery.  OTHER INSTRUCTIONS:   Knee Replacement:  Do not place pillow under knee, focus on keeping the knee straight while resting. CPM instructions: 0-90 degrees, 2 hours in the morning, 2 hours in the afternoon, and 2 hours in the evening. Place foam block, curve side up under heel at all times except when in CPM or when walking.  DO NOT modify, tear, cut, or change the foam block in any way.  POST-OPERATIVE OPIOID TAPER INSTRUCTIONS: It is important to wean off of your opioid medication as soon as possible. If you do not need pain medication after your surgery it is ok to stop  day one. Opioids include: Codeine, Hydrocodone(Norco, Vicodin), Oxycodone(Percocet, oxycontin) and hydromorphone amongst others.  Long term and even short term use of opiods can cause: Increased pain response Dependence Constipation Depression Respiratory depression And more.  Withdrawal symptoms can include Flu like symptoms Nausea, vomiting And more Techniques to manage these symptoms Hydrate well Eat regular healthy meals Stay active Use relaxation techniques(deep breathing, meditating, yoga) Do Not substitute Alcohol to help with tapering If you have been on opioids for less than two weeks and do not have pain than it is ok to stop all together.  Plan to wean off of opioids This plan should start within one week post op of your joint replacement. Maintain the same interval or time between taking each dose and first decrease the dose.  Cut the total daily intake of opioids by one tablet each day Next start to increase the time between doses. The last dose that should be eliminated is the evening dose.   MAKE SURE YOU:  Understand these instructions.  Get help right away if you are not doing well or get worse.    Thank you for letting us be a part of your medical care team.  It is a privilege we  respect greatly.  We hope these instructions will help you stay on track for a fast and full recovery!   Dental Antibiotics:  In most cases prophylactic antibiotics for Dental procdeures after total joint surgery are not necessary.  Exceptions are as follows:  1. History of prior total joint infection  2. Severely immunocompromised (Organ Transplant, cancer chemotherapy, Rheumatoid biologic meds such as [place])  3. Poorly controlled diabetes (A1C &gt; 8.0, blood glucose over 200)  If you have one of these conditions, contact your surgeon for an antibiotic prescription, prior to your dental procedure.

## 2022-01-09 NOTE — Progress Notes (Signed)
Physical Therapy Treatment Patient Details Name: Alexis Davis MRN: [id number] DOB: 1940-11-09 Today's Date: 01/09/2022   History of Present Illness 81 y.o. female presents to [facility] hospital on 01/07/2022 for elective L THA. PMH includes OA, osteoporosis, HTN.    PT Comments    Patient is agreeable to PT session and is hopeful to return home today with family support. Multiple family members at the bedside. Pain is well controlled. She ambulated 232f in hallway with rolling walker with reinforcement of proper positioning of rolling walker. Occasional cues provided for safety with bed mobility and transfers with no physical assistance required. Mobility is adequate for discharge home with family support. PT will follow up tomorrow if patent is still here.    Recommendations for follow up therapy are one component of a multi-disciplinary discharge planning process, led by the attending physician.  Recommendations may be updated based on patient status, additional functional criteria and insurance authorization.  Follow Up Recommendations  Follow physician's recommendations for discharge plan and follow up therapies     Assistance Recommended at Discharge Intermittent Supervision/Assistance  Patient can return home with the following A little help with walking and/or transfers;A lot of help with bathing/dressing/bathroom;Assistance with cooking/housework;Assist for transportation;Help with stairs or ramp for entrance   Equipment Recommendations  None recommended by PT    Recommendations for Other Services       Precautions / Restrictions Precautions Precautions: Fall Restrictions Weight Bearing Restrictions: No     Mobility  Bed Mobility Overal bed mobility: Needs Assistance Bed Mobility: Sit to Supine     Supine to sit: Supervision     General bed mobility comments: provided patient and family tips on how to assist with LLE if needed at home    Transfers Overall  transfer level: Needs assistance Equipment used: Rolling walker (2 wheels) Transfers: Sit to/from Stand Sit to Stand: Supervision           General transfer comment: initial verbal cues for hand placement for safety with carry over demonstrated    Ambulation/Gait Ambulation/Gait assistance: Supervision Gait Distance (Feet): 250 Feet Assistive device: Rolling walker (2 wheels) Gait Pattern/deviations: Step-through pattern Gait velocity: decreased     General Gait Details: reinforcement of proper rolling walker placement and to avoid trunk flexion with ambulation. spouse present throughout session   Stairs         General stair comments: verbally educated patient and spouse on proper technique for stair sequencing. stair training has already been completed   Wheelchair Mobility    Modified Rankin (Stroke Patients Only)       Balance Overall balance assessment: Needs assistance Sitting-balance support: No upper extremity supported, Feet supported Sitting balance-Leahy Scale: Good     Standing balance support: Bilateral upper extremity supported, Reliant on assistive device for balance Standing balance-Leahy Scale: Fair                              Cognition Arousal/Alertness: Awake/alert Behavior During Therapy: WFL for tasks assessed/performed Overall Cognitive Status: Within Functional Limits for tasks assessed                                          Exercises      General Comments General comments (skin integrity, edema, etc.): provided patient with a gait belt for home use per her request  Pertinent Vitals/Pain Pain Assessment Pain Assessment: Faces Faces Pain Scale: Hurts a little bit Pain Location: L hip Pain Descriptors / Indicators: Sore (only with movement) Pain Intervention(s): Limited activity within patient's tolerance, Monitored during session, Premedicated before session    Home Living                           Prior Function            PT Goals (current goals can now be found in the care plan section) Acute Rehab PT Goals Patient Stated Goal: to return to independence PT Goal Formulation: With patient Time For Goal Achievement: 01/21/22 Potential to Achieve Goals: Good Progress towards PT goals: Progressing toward goals    Frequency    7X/week      PT Plan Current plan remains appropriate    Co-evaluation              AM-PAC PT "6 Clicks" Mobility   Outcome Measure  Help needed turning from your back to your side while in a flat bed without using bedrails?: A Little Help needed moving from lying on your back to sitting on the side of a flat bed without using bedrails?: A Little Help needed moving to and from a bed to a chair (including a wheelchair)?: A Little Help needed standing up from a chair using your arms (e.g., wheelchair or bedside chair)?: A Little Help needed to walk in hospital room?: A Little Help needed climbing 3-5 steps with a railing? : A Little 6 Click Score: 18    End of Session Equipment Utilized During Treatment: Gait belt Activity Tolerance: Patient tolerated treatment well Patient left: in bed;with call bell/phone within reach;with family/visitor present (3 visitors in the room, patient requesting to remain seated on edge of bed) Nurse Communication: Mobility status PT Visit Diagnosis: Other abnormalities of gait and mobility (R26.89)     Time: 5573-2202 PT Time Calculation (min) (ACUTE ONLY): 18 min  Charges:  $Gait Training: 8-22 mins                     Minna Merritts, PT, MPT    Percell Locus 01/09/2022, 10:56 AM

## 2022-01-12 ENCOUNTER — Telehealth: Payer: Self-pay | Admitting: *Deleted

## 2022-01-12 NOTE — Telephone Encounter (Signed)
Ortho bundle D/C call completed. 

## 2022-01-15 ENCOUNTER — Ambulatory Visit (INDEPENDENT_AMBULATORY_CARE_PROVIDER_SITE_OTHER): Payer: Medicare Other | Admitting: Orthopaedic Surgery

## 2022-01-15 ENCOUNTER — Ambulatory Visit (INDEPENDENT_AMBULATORY_CARE_PROVIDER_SITE_OTHER): Payer: Medicare Other

## 2022-01-15 ENCOUNTER — Encounter: Payer: Self-pay | Admitting: Orthopaedic Surgery

## 2022-01-15 VITALS — Ht 64.0 in | Wt 164.0 lb

## 2022-01-15 DIAGNOSIS — Z96642 Presence of left artificial hip joint: Secondary | ICD-10-CM

## 2022-01-15 NOTE — Progress Notes (Signed)
Post-Op Visit Note   Patient: Alexis Davis           Date of Birth: 10/20/1940           MRN: [id number] Visit Date: 01/15/2022 PCP: Johnette Abraham, MD   Assessment & Plan: Postop total hip arthroplasty.   new dressing applied.  Next week's Thanksgiving so she will return Wednesday for nurse visit and have her staples removed and Steri-Strips applied.  She can follow-up with me in 4 weeks.  X-rays show good position alignment.  Chief Complaint:  Chief Complaint  Patient presents with   Left Hip - Routine Post Op    01/07/2022 Left THA   Visit Diagnoses:  1. S/P total left hip arthroplasty     Plan: Nurse visit 6 days and then follow-up with me 4 weeks later.  No x-ray needed on return.  Follow-Up Instructions: No follow-ups on file.   Orders:  Orders Placed This Encounter  Procedures   XR HIP UNILAT W OR W/O PELVIS 2-3 VIEWS LEFT   No orders of the defined types were placed in this encounter.   Imaging: No results found.  PMFS History: Patient Active Problem List   Diagnosis Date Noted   Arthritis of left hip 01/07/2022   Unilateral primary osteoarthritis, left hip 11/27/2021   Acute cough 11/06/2021   Annual physical exam 07/01/2021   Skin mole 07/01/2021   Seasonal allergies 07/19/2019   Peripheral polyneuropathy 11/24/2017   Vitamin D deficiency 06/22/2016   Varicose veins 03/16/2014   Tinea pedis 11/03/2013   OA (osteoarthritis) 11/03/2013   Osteoporosis 12/09/2012   Itching 12/09/2012   PTTD (posterior tibial tendon dysfunction) 09/01/2012   Neuropathy, leg 04/29/2012   Rotator cuff syndrome of right shoulder 11/26/2011   COPD, mild ([place]) 10/22/2008   ESSENTIAL HYPERTENSION, BENIGN 09/25/2008   Osteoarthritis of shoulder 06/12/2008   HEPATIC CYST 03/16/2006   DEGENERATION, DISC NOS 03/16/2006   Past Medical History:  Diagnosis Date   Arthritis    Shoulder   Compression fracture of L4 lumbar vertebra    Pt did not have pain, osteoporosis    COPD (chronic obstructive pulmonary disease) ([place]) 03/02/2008   PFT   Hepatic cyst    Hypertension    Osteoporosis     Family History  Problem Relation Age of Onset   Diabetes Mother    Heart disease Father    Hypertension Sister    Hypertension Sister    Hypertension Sister    Hypertension Brother    Diabetes Mellitus II Son    Arthritis Other    Breast cancer Neg Hx    Colon cancer Neg Hx    Cancer - Lung Neg Hx     Past Surgical History:  Procedure Laterality Date   ABDOMINAL HYSTERECTOMY     CATARACT EXTRACTION W/PHACO Right 08/23/2014   Procedure: CATARACT EXTRACTION PHACO AND INTRAOCULAR LENS PLACEMENT RIGHT EYE; CDE:  10.80;  Surgeon: Tonny Branch, MD;  Location: [facility];  Service: Ophthalmology;  Laterality: Right;   CATARACT EXTRACTION W/PHACO Left 10/08/2014   Procedure: CATARACT EXTRACTION PHACO AND INTRAOCULAR LENS PLACEMENT (IOC);  Surgeon: Tonny Branch, MD;  Location: [facility];  Service: Ophthalmology;  Laterality: Left;  CDE 6.60   TOTAL HIP ARTHROPLASTY Left 01/07/2022   Procedure: LEFT TOTAL HIP ARTHROPLASTY ANTERIOR APPROACH;  Surgeon: Marybelle Killings, MD;  Location: [place];  Service: Orthopedics;  Laterality: Left;   Social History   Occupational History   Not on file  Tobacco Use   Smoking status: Former    Types: Cigarettes   Smokeless tobacco: Never   Tobacco comments:    She quit smoking in January 2022. Smoked for 20 years, a pack usually last her a week.  Vaping Use   Vaping Use: Never used  Substance and Sexual Activity   Alcohol use: No   Drug use: No   Sexual activity: Yes

## 2022-01-15 NOTE — Discharge Summary (Signed)
Patient ID: Alexis Davis MRN: [id number] DOB/AGE: 82/29/1942 82 y.o.  Admit date: 01/07/2022 Discharge date: 01/09/2022  Admission Diagnoses:  Principal Problem:   Arthritis of left hip   Discharge Diagnoses:  Principal Problem:   Arthritis of left hip  status post Procedure(s): LEFT TOTAL HIP ARTHROPLASTY ANTERIOR APPROACH  Past Medical History:  Diagnosis Date   Arthritis    Shoulder   Compression fracture of L4 lumbar vertebra    Pt did not have pain, osteoporosis   COPD (chronic obstructive pulmonary disease) ([place]) 03/02/2008   PFT   Hepatic cyst    Hypertension    Osteoporosis     Surgeries: Procedure(s): LEFT TOTAL HIP ARTHROPLASTY ANTERIOR APPROACH on 01/07/2022   Consultants:   Discharged Condition: Improved  Hospital Course: Alexis Davis is an 81 y.o. female who was admitted 01/07/2022 for operative treatment of Arthritis of left hip. Patient failed conservative treatments (please see the history and physical for the specifics) and had severe unremitting pain that affects sleep, daily activities and work/hobbies. After pre-op clearance, the patient was taken to the operating room on 01/07/2022 and underwent  Procedure(s): LEFT TOTAL HIP ARTHROPLASTY ANTERIOR APPROACH.    Patient was given perioperative antibiotics:  Anti-infectives (From admission, onward)    Start     Dose/Rate Route Frequency Ordered Stop   01/07/22 1000  ceFAZolin (ANCEF) IVPB 2g/100 mL premix        2 g 200 mL/hr over 30 Minutes Intravenous On call to O.R. 01/07/22 6967 01/07/22 1307        Patient was given sequential compression devices and early ambulation to prevent DVT.   Patient benefited maximally from hospital stay and there were no complications. At the time of discharge, the patient was urinating/moving their bowels without difficulty, tolerating a regular diet, pain is controlled with oral pain medications and they have been cleared by PT/OT.   Recent vital  signs: No data found.   Recent laboratory studies: No results for input(s): "WBC", "HGB", "HCT", "PLT", "NA", "K", "CL", "CO2", "BUN", "CREATININE", "GLUCOSE", "INR", "CALCIUM" in the last 72 hours.  Invalid input(s): "PT", "2"   Discharge Medications:   Allergies as of 01/09/2022   No Known Allergies      Medication List     TAKE these medications    albuterol 108 (90 Base) MCG/ACT inhaler Commonly known as: VENTOLIN HFA Inhale 2 puffs into the lungs every 4 (four) hours as needed for wheezing or shortness of breath.   alendronate 70 MG tablet Commonly known as: FOSAMAX Take with a full glass of water on an empty stomach. What changed:  how much to take how to take this when to take this   amLODipine 10 MG tablet Commonly known as: NORVASC Take 1 tablet (10 mg total) by mouth daily.   aspirin EC 325 MG tablet Take 1 tablet (325 mg total) by mouth daily with breakfast.   CALTRATE 600+D PO Take 1 tablet by mouth 2 (two) times daily.   COD LIVER OIL PO Take 1 capsule by mouth daily.   hydrochlorothiazide 25 MG tablet Commonly known as: HYDRODIURIL Take 1 tablet (25 mg total) by mouth daily.   HYDROcodone-acetaminophen 10-325 MG tablet Commonly known as: NORCO Take 1 tablet by mouth every 4 (four) hours as needed for moderate pain.   methocarbamol 500 MG tablet Commonly known as: ROBAXIN Take 1 tablet (500 mg total) by mouth every 6 (six) hours as needed for muscle spasms.   Vitamin D3  25 MCG (1000 UT) Caps Take 1 capsule (1,000 Units total) by mouth daily.        Diagnostic Studies: XR HIP UNILAT W OR W/O PELVIS 2-3 VIEWS LEFT  Result Date: 01/15/2022 AP pelvis frog-leg left hip shows good position of total hip arthroplasty without loosening or subsidence.  Metal staples are noted. Impression: Satisfactory left total arthroplasty.  CT CHEST WO CONTRAST  Result Date: 01/09/2022 CLINICAL DATA:  Right pulmonary nodule seen in geographically on recent  chest x-ray. EXAM: CT CHEST WITHOUT CONTRAST TECHNIQUE: Multidetector CT imaging of the chest was performed following the standard protocol without IV contrast. RADIATION DOSE REDUCTION: This exam was performed according to the departmental dose-optimization program which includes automated exposure control, adjustment of the mA and/or kV according to patient size and/or use of iterative reconstruction technique. COMPARISON:  Chest x-ray January 09, 2022 FINDINGS: Cardiovascular: Normal heart size. Calcific atherosclerotic disease of the coronary arteries and thoracic aortic annulus. Calcific atherosclerotic disease and tortuosity of the aorta. Mediastinum/Nodes: No enlarged mediastinal or axillary lymph nodes. Thyroid gland, trachea, and esophagus demonstrate no significant findings. Lungs/Pleura: No evidence of focal consolidation, pleural effusion or pneumothorax. Small area of subtle tree-in-bud opacities in the superior segment of the right lower lobe may correspond to the nodule seen radiographically. Upper Abdomen: 5 mm nonobstructive right renal calculus, partially visualized. 2.8 cm rim calcified mass in the right lobe of the liver. Low-density 2 cm mass on the left adrenal may represent adrenal adenoma. Musculoskeletal: Multilevel spondylosis of the thoracic spine. IMPRESSION: 1. Small area of subtle tree-in-bud opacities in the superior segment of the right lower lobe may correspond to the nodule seen radiographically, possibly inflammatory. Recommend follow-up in 6 months with noncontrast chest CT to document resolution. 2. Calcific atherosclerotic disease of the coronary arteries and thoracic aortic annulus. 3. 2.8 cm rim calcified mass in the right lobe of the liver. 4. Low-density 2 cm mass on the left adrenal likely representing adrenal adenoma. 5. 5 mm nonobstructive right renal calculus. 6. Aortic atherosclerosis. Aortic Atherosclerosis (ICD10-I70.0). Electronically Signed   By: Fidela Salisbury M.D.   On: 01/09/2022 13:21   DG CHEST PORT 1 VIEW  Result Date: 01/09/2022 CLINICAL DATA:  Postop fever. EXAM: PORTABLE CHEST 1 VIEW COMPARISON:  07/17/2016 FINDINGS: The lungs are clear without focal pneumonia, edema, pneumothorax or pleural effusion. Tiny nodule seen in the lower right lung, superimposed on the posterior right seventh rib. Cardiopericardial silhouette is at upper limits of normal for size. The visualized bony structures of the thorax are unremarkable. IMPRESSION: 1. No acute cardiopulmonary findings. 2. Tiny right lung nodule. CT chest without contrast recommended to further evaluate. Electronically Signed   By: Misty Stanley M.D.   On: 01/09/2022 09:12   DG Hip Port Unilat With Pelvis 1V Left  Result Date: 01/07/2022 CLINICAL DATA:  Postop. EXAM: DG HIP (WITH OR WITHOUT PELVIS) 1V PORT LEFT COMPARISON:  Preoperative imaging. FINDINGS: Left hip arthroplasty in expected alignment. No periprosthetic lucency or fracture. Recent postsurgical change includes air and edema in the soft tissues. Lateral skin staples in place. IMPRESSION: Left hip arthroplasty without immediate complication. Electronically Signed   By: Keith Rake M.D.   On: 01/07/2022 14:57   DG HIP UNILAT WITH PELVIS 1V LEFT  Result Date: 01/07/2022 CLINICAL DATA:  Left total hip arthroplasty. EXAM: DG HIP (WITH OR WITHOUT PELVIS) 1V*L*; DG C-ARM 1-60 MIN-NO REPORT Radiation exposure index: 1.7952 mGy. COMPARISON:  September 22, 2021. FINDINGS: Seven intraoperative fluoroscopic images were  obtained of the left hip. The left femoral and femoral components are well situated. IMPRESSION: Fluoroscopic guidance provided during left total hip arthroplasty. Electronically Signed   By: Marijo Conception M.D.   On: 01/07/2022 14:18   DG C-Arm 1-60 Min-No Report  Result Date: 01/07/2022 Fluoroscopy was utilized by the requesting physician.  No radiographic interpretation.   DG C-Arm 1-60 Min-No Report  Result Date:  01/07/2022 Fluoroscopy was utilized by the requesting physician.  No radiographic interpretation.       Follow-up Information     Marybelle Killings, MD. Schedule an appointment as soon as possible for a visit.   Specialty: Orthopedic Surgery Why: needs appointment with dr Lorin Mercy one week postop Contact information: [place] [address] [place], [place] Follow up.   Specialty: [place] Why: home health services will be provided by [facility], start of care Sunday 01/11/2022. Contact information: [address] [place] [address] [phone]                 Discharge Plan:  discharge to home  Disposition:     Signed: Benjiman Core  01/15/2022, 11:49 AM

## 2022-01-16 ENCOUNTER — Telehealth: Payer: Self-pay | Admitting: *Deleted

## 2022-01-16 NOTE — Telephone Encounter (Signed)
Ortho bundle visit completed. 

## 2022-01-21 ENCOUNTER — Encounter: Payer: Medicare Other | Admitting: Orthopedic Surgery

## 2022-01-21 ENCOUNTER — Telehealth: Payer: Self-pay | Admitting: Radiology

## 2022-01-21 ENCOUNTER — Telehealth: Payer: Self-pay | Admitting: *Deleted

## 2022-01-21 NOTE — Telephone Encounter (Signed)
Attempted 14 day call to patient; no answer and no VM set up. Unable to leave a message.

## 2022-01-21 NOTE — Telephone Encounter (Signed)
Tried to reach patient. No answer, no voicemail set up.

## 2022-01-21 NOTE — Telephone Encounter (Signed)
FYI  Please see message from [place] office.  Pt did not come for suture removal.

## 2022-01-21 NOTE — Telephone Encounter (Signed)
I called again, voicemail not set up. Unable to leave message.

## 2022-01-26 NOTE — Telephone Encounter (Signed)
Unable to reach patient.

## 2022-01-29 ENCOUNTER — Encounter: Payer: Self-pay | Admitting: Orthopaedic Surgery

## 2022-01-29 ENCOUNTER — Telehealth: Payer: Self-pay | Admitting: *Deleted

## 2022-01-29 ENCOUNTER — Ambulatory Visit (INDEPENDENT_AMBULATORY_CARE_PROVIDER_SITE_OTHER): Payer: Medicare Other | Admitting: Orthopaedic Surgery

## 2022-01-29 VITALS — Ht 64.0 in | Wt 164.0 lb

## 2022-01-29 DIAGNOSIS — Z96642 Presence of left artificial hip joint: Secondary | ICD-10-CM | POA: Insufficient documentation

## 2022-01-29 MED ORDER — HYDROCODONE-ACETAMINOPHEN 5-325 MG PO TABS: 1.0000 | ORAL_TABLET | Freq: Four times a day (QID) | ORAL | 0 refills | Status: DC | PRN

## 2022-01-29 NOTE — Telephone Encounter (Signed)
Finally got up with patient. Informed she was to come back in one week for nurse visit to have staples removed from hip incision. She reports she was confused on this. Message sent to [facility], who can see patient in [place] office today. Patient will go to [place] office immediately.

## 2022-01-29 NOTE — Progress Notes (Signed)
Post-Op Visit Note   Patient: Alexis Davis           Date of Birth: 27-Aug-1940           MRN: [id number] Visit Date: 01/29/2022 PCP: Johnette Abraham, MD   Assessment & Plan: 81 year old female post total hip arthroplasty left.  Staples are harvested today 1 week late incision looks good she has some mild swelling still has some pain as expected she is walking better.  We will switch her from the Norco 10 to the [place] 5.  She is usually been taking 1 twice a day.  I will recheck her in 3 weeks.  Chief Complaint:  Chief Complaint  Patient presents with   Left Hip - Routine Post Op    01/07/2022 Left THA   Visit Diagnoses:  1. S/P total left hip arthroplasty     Plan: Recheck 3 weeks continue walking with walker.  Follow-Up Instructions: Return in about 3 weeks (around 02/19/2022).   Orders:  No orders of the defined types were placed in this encounter.  Meds ordered this encounter  Medications   HYDROcodone-acetaminophen (NORCO/VICODIN) 5-325 MG tablet    Sig: Take 1 tablet by mouth every 6 (six) hours as needed for moderate pain.    Dispense:  40 tablet    Refill:  0    Post op    Imaging: No results found.  PMFS History: Patient Active Problem List   Diagnosis Date Noted   S/P total left hip arthroplasty 01/29/2022   Arthritis of left hip 01/07/2022   Acute cough 11/06/2021   Annual physical exam 07/01/2021   Skin mole 07/01/2021   Seasonal allergies 07/19/2019   Peripheral polyneuropathy 11/24/2017   Vitamin D deficiency 06/22/2016   Varicose veins 03/16/2014   Tinea pedis 11/03/2013   OA (osteoarthritis) 11/03/2013   Osteoporosis 12/09/2012   Itching 12/09/2012   PTTD (posterior tibial tendon dysfunction) 09/01/2012   Neuropathy, leg 04/29/2012   Rotator cuff syndrome of right shoulder 11/26/2011   COPD, mild ([place]) 10/22/2008   ESSENTIAL HYPERTENSION, BENIGN 09/25/2008   Osteoarthritis of shoulder 06/12/2008   HEPATIC CYST 03/16/2006    DEGENERATION, DISC NOS 03/16/2006   Past Medical History:  Diagnosis Date   Arthritis    Shoulder   Compression fracture of L4 lumbar vertebra    Pt did not have pain, osteoporosis   COPD (chronic obstructive pulmonary disease) ([place]) 03/02/2008   PFT   Hepatic cyst    Hypertension    Osteoporosis     Family History  Problem Relation Age of Onset   Diabetes Mother    Heart disease Father    Hypertension Sister    Hypertension Sister    Hypertension Sister    Hypertension Brother    Diabetes Mellitus II Son    Arthritis Other    Breast cancer Neg Hx    Colon cancer Neg Hx    Cancer - Lung Neg Hx     Past Surgical History:  Procedure Laterality Date   ABDOMINAL HYSTERECTOMY     CATARACT EXTRACTION W/PHACO Right 08/23/2014   Procedure: CATARACT EXTRACTION PHACO AND INTRAOCULAR LENS PLACEMENT RIGHT EYE; CDE:  10.80;  Surgeon: Tonny Branch, MD;  Location: [facility];  Service: Ophthalmology;  Laterality: Right;   CATARACT EXTRACTION W/PHACO Left 10/08/2014   Procedure: CATARACT EXTRACTION PHACO AND INTRAOCULAR LENS PLACEMENT (IOC);  Surgeon: Tonny Branch, MD;  Location: [facility];  Service: Ophthalmology;  Laterality: Left;  CDE 6.60  TOTAL HIP ARTHROPLASTY Left 01/07/2022   Procedure: LEFT TOTAL HIP ARTHROPLASTY ANTERIOR APPROACH;  Surgeon: Marybelle Killings, MD;  Location: [facility];  Service: Orthopedics;  Laterality: Left;   Social History   Occupational History   Not on file  Tobacco Use   Smoking status: Former    Types: Cigarettes   Smokeless tobacco: Never   Tobacco comments:    She quit smoking in January 2022. Smoked for 20 years, a pack usually last her a week.  Vaping Use   Vaping Use: Never used  Substance and Sexual Activity   Alcohol use: No   Drug use: No   Sexual activity: Yes

## 2022-02-04 ENCOUNTER — Telehealth: Payer: Self-pay | Admitting: *Deleted

## 2022-02-04 DIAGNOSIS — M7989 Other specified soft tissue disorders: Secondary | ICD-10-CM

## 2022-02-04 NOTE — Telephone Encounter (Signed)
Patient called stating she is having pain in her leg that feels like a "knot". Took lots of questions to get her to describe where the pain was. She finally stated that she is tender to touch with a palpable knot in the calf muscle that has just started overnight or the last day or so. Very painful she describes. S/P left hip on 01/07/22.

## 2022-02-04 NOTE — Telephone Encounter (Signed)
I called patient. Entered order for doppler to rule out DVT to be done at [facility] tomorrow. Advised patient that someone will call to schedule.   Alexis Davis with [place] office working on scheduling.

## 2022-02-05 ENCOUNTER — Encounter: Payer: Self-pay | Admitting: Internal Medicine

## 2022-02-05 ENCOUNTER — Ambulatory Visit (HOSPITAL_COMMUNITY)
Admission: RE | Admit: 2022-02-05 | Discharge: 2022-02-05 | Disposition: A | Discharge status: Home / Self Care | Payer: Medicare Other | Source: Ambulatory Visit | Attending: Orthopaedic Surgery | Admitting: Orthopaedic Surgery

## 2022-02-05 ENCOUNTER — Ambulatory Visit (INDEPENDENT_AMBULATORY_CARE_PROVIDER_SITE_OTHER): Payer: Medicare Other | Admitting: Internal Medicine

## 2022-02-05 VITALS — BP 148/72 | HR 71 | Ht 64.0 in | Wt 170.0 lb

## 2022-02-05 DIAGNOSIS — I8002 Phlebitis and thrombophlebitis of superficial vessels of left lower extremity: Secondary | ICD-10-CM | POA: Diagnosis not present

## 2022-02-05 DIAGNOSIS — I10 Essential (primary) hypertension: Secondary | ICD-10-CM

## 2022-02-05 DIAGNOSIS — M7989 Other specified soft tissue disorders: Secondary | ICD-10-CM | POA: Insufficient documentation

## 2022-02-05 DIAGNOSIS — Z96642 Presence of left artificial hip joint: Secondary | ICD-10-CM

## 2022-02-05 DIAGNOSIS — J449 Chronic obstructive pulmonary disease, unspecified: Secondary | ICD-10-CM | POA: Diagnosis not present

## 2022-02-05 HISTORY — DX: Phlebitis and thrombophlebitis of superficial vessels of left lower extremity: I80.02

## 2022-02-05 MED ORDER — APIXABAN 5 MG PO TABS: 5.0000 mg | ORAL_TABLET | Freq: Two times a day (BID) | ORAL | 0 refills | Status: DC

## 2022-02-05 MED ORDER — APIXABAN 5 MG PO TABS: 5.0000 mg | ORAL_TABLET | Freq: Two times a day (BID) | ORAL | 2 refills | Status: DC

## 2022-02-05 NOTE — Assessment & Plan Note (Addendum)
Currently prescribed amlodipine 10 mg daily and HCTZ 25 mg daily for treatment of hypertension her blood pressure is 157/68 today.  She has previously been well-controlled this regimen.  She attributes her elevated reading today to pain related to recent left hip THA and a knot in her left calf currently. -No medication changes today.  Will plan for follow-up in 6 weeks for HTN check.

## 2022-02-05 NOTE — Assessment & Plan Note (Signed)
S/p THA of left hip in November.  Her postoperative course has been uncomplicated.  She is followed by Dr. Lorin Mercy.  Currently ambulates with a walker.  She has follow-up scheduled with orthopedic surgery on 12/21.

## 2022-02-05 NOTE — Assessment & Plan Note (Signed)
She endorsed a knot in her left calf earlier today and a vascular ultrasound of left lower extremity was ordered by orthopedic surgery.  This demonstrated superficial thrombophlebitis in the greater saphenous vein with nonocclusive thrombus present from the calf to the mid thigh.  Negative for DVT.  There is no surrounding erythema or edema present. -Prophylactic anticoagulation is indicated based on location and size.  I have prescribed Eliquis 5 mg twice daily x 45 days. -Reassess at follow-up in 6 weeks

## 2022-02-05 NOTE — Assessment & Plan Note (Addendum)
Asymptomatic currently.  Unremarkable pulmonary exam.  She is currently prescribed albuterol for as needed use, but states that she has not needed to use her inhaler recently.

## 2022-02-05 NOTE — Progress Notes (Signed)
Established Patient Office Visit  Subjective   Patient ID: Alexis Davis, female    DOB: [birth year]  Age: 81 y.o. MRN: [id number]  Chief Complaint  Patient presents with   Hypertension    Follow up   Alexis Davis returns to care today.  She was last seen by me on 10/5 for HTN follow-up.  No medication changes were made.  In the interim she has undergone left hip THA on 11/8 with Dr. Lorin Mercy.  Alexis Davis postoperative course has been uncomplicated.  There have otherwise been no acute interval events.  Today Alexis Davis states that she feels fairly well.  She reports that she underwent a vascular ultrasound of Alexis Davis left leg this morning due to him not in Alexis Davis calf.  She is otherwise asymptomatic currently.  She endorses that Alexis Davis pain has been fairly tolerable since surgery.  Past Medical History:  Diagnosis Date   Arthritis    Shoulder   Compression fracture of L4 lumbar vertebra    Pt did not have pain, osteoporosis   COPD (chronic obstructive pulmonary disease) ([place]) 03/02/2008   PFT   Hepatic cyst    Hypertension    Osteoporosis    Past Surgical History:  Procedure Laterality Date   ABDOMINAL HYSTERECTOMY     CATARACT EXTRACTION W/PHACO Right 08/23/2014   Procedure: CATARACT EXTRACTION PHACO AND INTRAOCULAR LENS PLACEMENT RIGHT EYE; CDE:  10.80;  Surgeon: Tonny Branch, MD;  Location: [facility];  Service: Ophthalmology;  Laterality: Right;   CATARACT EXTRACTION W/PHACO Left 10/08/2014   Procedure: CATARACT EXTRACTION PHACO AND INTRAOCULAR LENS PLACEMENT (IOC);  Surgeon: Tonny Branch, MD;  Location: [facility];  Service: Ophthalmology;  Laterality: Left;  CDE 6.60   TOTAL HIP ARTHROPLASTY Left 01/07/2022   Procedure: LEFT TOTAL HIP ARTHROPLASTY ANTERIOR APPROACH;  Surgeon: Marybelle Killings, MD;  Location: [facility];  Service: Orthopedics;  Laterality: Left;   Social History   Tobacco Use   Smoking status: Former    Types: Cigarettes   Smokeless tobacco: Never   Tobacco comments:    She quit smoking in  January 2022. Smoked for 20 years, a pack usually last Alexis Davis a week.  Vaping Use   Vaping Use: Never used  Substance Use Topics   Alcohol use: No   Drug use: No   Family History  Problem Relation Age of Onset   Diabetes Mother    Heart disease Father    Hypertension Sister    Hypertension Sister    Hypertension Sister    Hypertension Brother    Diabetes Mellitus II Son    Arthritis Other    Breast cancer Neg Hx    Colon cancer Neg Hx    Cancer - Lung Neg Hx    No Known Allergies  Review of Systems  Musculoskeletal:  Positive for joint pain (Left hip s/p THA) and myalgias (Knot in the left calf).  All other systems reviewed and are negative.    Objective:     BP (!) 157/68   Pulse 71   Ht '5\' 4"'$  (1.626 m)   Wt 170 lb (77.1 kg)   SpO2 94%   BMI 29.18 kg/m  BP Readings from Last 3 Encounters:  02/05/22 (!) 157/68  01/09/22 115/62  01/06/22 (!) 147/77   Physical Exam Vitals reviewed.  Constitutional:      General: She is not in acute distress.    Appearance: Normal appearance.  HENT:     Head: Normocephalic and atraumatic.  Right Ear: External ear normal.     Left Ear: External ear normal.     Nose: Nose normal. No congestion or rhinorrhea.     Mouth/Throat:     Mouth: Mucous membranes are moist.     Pharynx: Oropharynx is clear. No oropharyngeal exudate or posterior oropharyngeal erythema.  Eyes:     General: No scleral icterus.    Extraocular Movements: Extraocular movements intact.     Conjunctiva/sclera: Conjunctivae normal.     Pupils: Pupils are equal, round, and reactive to light.  Cardiovascular:     Rate and Rhythm: Normal rate and regular rhythm.     Pulses: Normal pulses.     Heart sounds: Normal heart sounds. No murmur heard.    No friction rub. No gallop.  Pulmonary:     Effort: Pulmonary effort is normal.     Breath sounds: Normal breath sounds. No wheezing, rhonchi or rales.  Abdominal:     General: Abdomen is flat. Bowel sounds are  normal. There is no distension.     Palpations: Abdomen is soft.     Tenderness: There is no abdominal tenderness.  Musculoskeletal:     Cervical back: Normal range of motion.  Skin:    General: Skin is warm and dry.     Capillary Refill: Capillary refill takes less than 2 seconds.     Coloration: Skin is not jaundiced.  Neurological:     General: No focal deficit present.     Mental Status: She is alert and oriented to person, place, and time.     Gait: Gait abnormal (Ambulates with walker).  Psychiatric:        Mood and Affect: Mood normal.        Behavior: Behavior normal.    Last CBC Lab Results  Component Value Date   WBC 14.5 (H) 01/09/2022   HGB 11.6 (L) 01/09/2022   HCT 33.3 (L) 01/09/2022   MCV 89.0 01/09/2022   MCH 31.0 01/09/2022   RDW 13.1 01/09/2022   PLT 208 16/12/9602   Last metabolic panel Lab Results  Component Value Date   GLUCOSE 130 (H) 01/08/2022   NA 133 (L) 01/08/2022   K 3.6 01/08/2022   CL 100 01/08/2022   CO2 24 01/08/2022   BUN 7 (L) 01/08/2022   CREATININE 0.58 01/08/2022   GFRNONAA >60 01/08/2022   CALCIUM 9.3 01/08/2022   PROT 6.4 07/01/2021   ALBUMIN 4.0 07/01/2021   LABGLOB 2.4 07/01/2021   AGRATIO 1.7 07/01/2021   BILITOT 0.3 07/01/2021   ALKPHOS 104 07/01/2021   AST 23 07/01/2021   ALT 10 07/01/2021   ANIONGAP 9 01/08/2022   Last lipids Lab Results  Component Value Date   CHOL 167 07/01/2021   HDL 56 07/01/2021   LDLCALC 96 07/01/2021   TRIG 77 07/01/2021   CHOLHDL 3.0 07/01/2021   Last hemoglobin A1c Lab Results  Component Value Date   HGBA1C 5.6 07/01/2021   Last thyroid functions Lab Results  Component Value Date   TSH 1.520 07/01/2021   Last vitamin D Lab Results  Component Value Date   VD25OH 33.0 07/01/2021   Last vitamin B12 and Folate Lab Results  Component Value Date   VITAMINB12 449 07/19/2019   FOLATE 15.3 11/24/2017     Assessment & Plan:   Problem List Items Addressed This Visit        Essential hypertension, benign    Currently prescribed amlodipine 10 mg daily and HCTZ 25 mg daily for  treatment of hypertension Alexis Davis blood pressure is 157/68 today.  She has previously been well-controlled this regimen.  She attributes Alexis Davis elevated reading today to pain related to recent left hip THA and a knot in Alexis Davis left calf currently. -No medication changes today.  Will plan for follow-up in 6 weeks for HTN check.      Thrombophlebitis of superficial veins of left lower extremity - Primary    She endorsed a knot in Alexis Davis left calf earlier today and a vascular ultrasound of left lower extremity was ordered by orthopedic surgery.  This demonstrated superficial thrombophlebitis in the greater saphenous vein with nonocclusive thrombus present from the calf to the mid thigh.  Negative for DVT.  There is no surrounding erythema or edema present. -Prophylactic anticoagulation is indicated based on location and size.  I have prescribed Eliquis 5 mg twice daily x 45 days. -Reassess at follow-up in 6 weeks      COPD, mild ([place])    Asymptomatic currently.  Unremarkable pulmonary exam.  She is currently prescribed albuterol for as needed use, but states that she has not needed to use Alexis Davis inhaler recently.      S/P total left hip arthroplasty    S/p THA of left hip in November.  Alexis Davis postoperative course has been uncomplicated.  She is followed by Dr. Lorin Mercy.  Currently ambulates with a walker.  She has follow-up scheduled with orthopedic surgery on 12/21.       Return in about 6 weeks (around 03/19/2022) for HTN.    Johnette Abraham, MD

## 2022-02-05 NOTE — Patient Instructions (Signed)
It was a pleasure to see you today.  Thank you for giving [place] the opportunity to be involved in your care.  Below is a brief recap of your visit and next steps.  We will plan to see you again in 6 weeks.  Summary No medication changes today We will follow up in 6 weeks for a blood pressure check I recommend applying a warm compress to your leg

## 2022-02-19 ENCOUNTER — Ambulatory Visit (INDEPENDENT_AMBULATORY_CARE_PROVIDER_SITE_OTHER): Payer: Medicare Other | Admitting: Orthopaedic Surgery

## 2022-02-19 ENCOUNTER — Encounter: Payer: Self-pay | Admitting: Orthopaedic Surgery

## 2022-02-19 VITALS — Ht 64.0 in | Wt 170.0 lb

## 2022-02-19 DIAGNOSIS — Z96642 Presence of left artificial hip joint: Secondary | ICD-10-CM

## 2022-02-19 MED ORDER — TRAMADOL HCL 50 MG PO TABS: 50.0000 mg | ORAL_TABLET | Freq: Every evening | ORAL | 0 refills | Status: DC | PRN

## 2022-02-19 NOTE — Progress Notes (Signed)
Post-Op Visit Note   Patient: Alexis Davis           Date of Birth: Jul 18, 1940           MRN: [id number] Visit Date: 02/19/2022 PCP: Johnette Abraham, MD   Assessment & Plan: Follow-up left total hip arthroplasty.  She needs to apply some lotion to the incision.  She sometimes forgets to use her walker.  She has a cane at home she can use in her right hand opposite of her left total of arthroplasty.  She is gradually increasing her ambulation.  Requested final prescription tramadol sent and she can just take 1 at night as needed.  Recheck 6 weeks.  Chief Complaint:  Chief Complaint  Patient presents with   Left Hip - Routine Post Op    01/07/2022 Left THA   Visit Diagnoses:  1. S/P total left hip arthroplasty     Plan: ROV 6 wks. Gradually increase ambulation and progress to cane.   Follow-Up Instructions: Return in about 6 weeks (around 04/02/2022).   Orders:  No orders of the defined types were placed in this encounter.  Meds ordered this encounter  Medications   traMADol (ULTRAM) 50 MG tablet    Sig: Take 1 tablet (50 mg total) by mouth at bedtime as needed.    Dispense:  30 tablet    Refill:  0    Post op pain    Imaging: No results found.  PMFS History: Patient Active Problem List   Diagnosis Date Noted   Thrombophlebitis of superficial veins of left lower extremity 02/05/2022   S/P total left hip arthroplasty 01/29/2022   Arthritis of left hip 01/07/2022   Acute cough 11/06/2021   Annual physical exam 07/01/2021   Skin mole 07/01/2021   Seasonal allergies 07/19/2019   Peripheral polyneuropathy 11/24/2017   Vitamin D deficiency 06/22/2016   Varicose veins 03/16/2014   Tinea pedis 11/03/2013   OA (osteoarthritis) 11/03/2013   Osteoporosis 12/09/2012   Itching 12/09/2012   PTTD (posterior tibial tendon dysfunction) 09/01/2012   Neuropathy, leg 04/29/2012   Rotator cuff syndrome of right shoulder 11/26/2011   COPD, mild ([place]) 10/22/2008   Essential  hypertension, benign 09/25/2008   Osteoarthritis of shoulder 06/12/2008   HEPATIC CYST 03/16/2006   DEGENERATION, DISC NOS 03/16/2006   Past Medical History:  Diagnosis Date   Arthritis    Shoulder   Compression fracture of L4 lumbar vertebra    Pt did not have pain, osteoporosis   COPD (chronic obstructive pulmonary disease) ([place]) 03/02/2008   PFT   Hepatic cyst    Hypertension    Osteoporosis     Family History  Problem Relation Age of Onset   Diabetes Mother    Heart disease Father    Hypertension Sister    Hypertension Sister    Hypertension Sister    Hypertension Brother    Diabetes Mellitus II Son    Arthritis Other    Breast cancer Neg Hx    Colon cancer Neg Hx    Cancer - Lung Neg Hx     Past Surgical History:  Procedure Laterality Date   ABDOMINAL HYSTERECTOMY     CATARACT EXTRACTION W/PHACO Right 08/23/2014   Procedure: CATARACT EXTRACTION PHACO AND INTRAOCULAR LENS PLACEMENT RIGHT EYE; CDE:  10.80;  Surgeon: Tonny Branch, MD;  Location: [facility];  Service: Ophthalmology;  Laterality: Right;   CATARACT EXTRACTION W/PHACO Left 10/08/2014   Procedure: CATARACT EXTRACTION PHACO AND INTRAOCULAR LENS PLACEMENT (  [place]);  Surgeon: Tonny Branch, MD;  Location: [facility];  Service: Ophthalmology;  Laterality: Left;  CDE 6.60   TOTAL HIP ARTHROPLASTY Left 01/07/2022   Procedure: LEFT TOTAL HIP ARTHROPLASTY ANTERIOR APPROACH;  Surgeon: Marybelle Killings, MD;  Location: [facility];  Service: Orthopedics;  Laterality: Left;   Social History   Occupational History   Not on file  Tobacco Use   Smoking status: Former    Types: Cigarettes   Smokeless tobacco: Never   Tobacco comments:    She quit smoking in January 2022. Smoked for 20 years, a pack usually last her a week.  Vaping Use   Vaping Use: Never used  Substance and Sexual Activity   Alcohol use: No   Drug use: No   Sexual activity: Yes

## 2022-02-26 ENCOUNTER — Telehealth: Payer: Self-pay | Admitting: *Deleted

## 2022-02-26 NOTE — Telephone Encounter (Signed)
Completed Ortho bundle call. No new needs. Doing well.

## 2022-03-19 ENCOUNTER — Ambulatory Visit: Payer: Medicare Other | Admitting: Internal Medicine

## 2022-03-23 ENCOUNTER — Ambulatory Visit (INDEPENDENT_AMBULATORY_CARE_PROVIDER_SITE_OTHER): Payer: Medicare Other | Admitting: Internal Medicine

## 2022-03-23 ENCOUNTER — Encounter: Payer: Self-pay | Admitting: Internal Medicine

## 2022-03-23 VITALS — BP 127/71 | HR 70 | Ht 64.0 in | Wt 168.2 lb

## 2022-03-23 DIAGNOSIS — R300 Dysuria: Secondary | ICD-10-CM | POA: Diagnosis not present

## 2022-03-23 DIAGNOSIS — I8002 Phlebitis and thrombophlebitis of superficial vessels of left lower extremity: Secondary | ICD-10-CM

## 2022-03-23 DIAGNOSIS — N3000 Acute cystitis without hematuria: Secondary | ICD-10-CM | POA: Diagnosis not present

## 2022-03-23 DIAGNOSIS — I10 Essential (primary) hypertension: Secondary | ICD-10-CM

## 2022-03-23 MED ORDER — AMLODIPINE BESYLATE 10 MG PO TABS: 10.0000 mg | ORAL_TABLET | Freq: Every day | ORAL | 0 refills | Status: DC

## 2022-03-23 MED ORDER — NITROFURANTOIN MONOHYD MACRO 100 MG PO CAPS: 100.0000 mg | ORAL_CAPSULE | Freq: Two times a day (BID) | ORAL | 0 refills | Status: AC

## 2022-03-23 MED ORDER — HYDROCHLOROTHIAZIDE 25 MG PO TABS: 25.0000 mg | ORAL_TABLET | Freq: Every day | ORAL | 2 refills | Status: DC

## 2022-03-23 NOTE — Assessment & Plan Note (Signed)
Currently endorsing dysuria with subjective fever/chills.  UA with trace LE. -Given her symptoms, I have prescribed Macrobid x 5 days -Follow-up urine culture

## 2022-03-23 NOTE — Patient Instructions (Signed)
It was a pleasure to see you today.  Thank you for giving [place] the opportunity to be involved in your care.  Below is a brief recap of your visit and next steps.  We will plan to see you again in 3 months.  Summary Stop Eliquis I have refilled your blood pressure medications We will follow up in 3 months

## 2022-03-23 NOTE — Assessment & Plan Note (Signed)
She is currently prescribed amlodipine 10 mg daily and HCTZ 25 mg daily for treatment of hypertension.  Her blood pressure today is 127/71. -No medication changes today.  Continue current antihypertensive regimen.  Refills have been provided.

## 2022-03-23 NOTE — Assessment & Plan Note (Signed)
Noted on vascular ultrasound of the left lower extremity obtained 12/7.  Prophylactic anticoagulation with Eliquis x 45 days but started the same day.  She has been taking Eliquis as prescribed.  There is no evidence of the previously noted nodularity on the left lower extremity present today.  No rashes present.  She is asymptomatic. -Discontinue Eliquis today.

## 2022-03-23 NOTE — Progress Notes (Signed)
Established Patient Office Visit  Subjective   Patient ID: Alexis Davis, female    DOB: 24-Jan-1941  Age: 82 y.o. MRN: [id number]  Chief Complaint  Patient presents with   Hypertension    Follow up   Ms. Alexis Davis returns to care today for HTN follow up.  She was last seen by me on 12/7 for HTN follow-up.  Her blood pressure was elevated at that time, however no medication changes were made as her elevated reading was attributed to pain related to recent left hip THA.  Her blood pressure had previously been well-controlled on her current antihypertensive regimen.  She also endorsed a knot in her left calf.  A vascular ultrasound had been completed earlier that day and demonstrated a superficial thrombophlebitis in the greater saphenous vein with nonocclusive thrombus present from the calf to the mid thigh.  Prophylactic anticoagulation Eliquis 5 mg twice daily x 45 days was prescribed.  In the interim she has been seen by orthopedic surgery (Dr. Lorin Mercy) for follow-up.  There have otherwise been no acute interval events. Today Alexis Davis states that she feels fairly well. Her left hip continues to hurt. She does not feel she is making significant progress.  She plans to follow-up with her orthopedic surgeon soon to discuss her concerns.  She additionally endorses dysuria and subjective fever/chills.  Past Medical History:  Diagnosis Date   Arthritis    Shoulder   Compression fracture of L4 lumbar vertebra    Pt did not have pain, osteoporosis   COPD (chronic obstructive pulmonary disease) ([place]) 03/02/2008   PFT   Hepatic cyst    Hypertension    Osteoporosis    Past Surgical History:  Procedure Laterality Date   ABDOMINAL HYSTERECTOMY     CATARACT EXTRACTION W/PHACO Right 08/23/2014   Procedure: CATARACT EXTRACTION PHACO AND INTRAOCULAR LENS PLACEMENT RIGHT EYE; CDE:  10.80;  Surgeon: Tonny Branch, MD;  Location: [facility];  Service: Ophthalmology;  Laterality: Right;   CATARACT EXTRACTION  W/PHACO Left 10/08/2014   Procedure: CATARACT EXTRACTION PHACO AND INTRAOCULAR LENS PLACEMENT ([facility]);  Surgeon: Tonny Branch, MD;  Location: [facility];  Service: Ophthalmology;  Laterality: Left;  CDE 6.60   TOTAL HIP ARTHROPLASTY Left 01/07/2022   Procedure: LEFT TOTAL HIP ARTHROPLASTY ANTERIOR APPROACH;  Surgeon: Marybelle Killings, MD;  Location: [facility];  Service: Orthopedics;  Laterality: Left;   Social History   Tobacco Use   Smoking status: Former    Types: Cigarettes   Smokeless tobacco: Never   Tobacco comments:    She quit smoking in January 2022. Smoked for 20 years, a pack usually last her a week.  Vaping Use   Vaping Use: Never used  Substance Use Topics   Alcohol use: No   Drug use: No   Family History  Problem Relation Age of Onset   Diabetes Mother    Heart disease Father    Hypertension Sister    Hypertension Sister    Hypertension Sister    Hypertension Brother    Diabetes Mellitus II Son    Arthritis Other    Breast cancer Neg Hx    Colon cancer Neg Hx    Cancer - Lung Neg Hx    No Known Allergies  Review of Systems  Constitutional:  Positive for chills and fever.  Genitourinary:  Positive for dysuria. Negative for flank pain, frequency, hematuria and urgency.  Musculoskeletal:  Positive for joint pain (L hip).     Objective:  BP 127/71   Pulse 70   Ht '5\' 4"'$  (1.626 m)   Wt 168 lb 3.2 oz (76.3 kg)   SpO2 98%   BMI 28.87 kg/m  BP Readings from Last 3 Encounters:  03/23/22 127/71  02/05/22 (!) 148/72  01/09/22 115/62   Physical Exam Vitals reviewed.  Constitutional:      General: She is not in acute distress.    Appearance: Normal appearance.  HENT:     Head: Normocephalic and atraumatic.     Right Ear: External ear normal.     Left Ear: External ear normal.     Nose: Nose normal. No congestion or rhinorrhea.     Mouth/Throat:     Mouth: Mucous membranes are moist.     Pharynx: Oropharynx is clear. No oropharyngeal exudate or posterior  oropharyngeal erythema.  Eyes:     General: No scleral icterus.    Extraocular Movements: Extraocular movements intact.     Conjunctiva/sclera: Conjunctivae normal.     Pupils: Pupils are equal, round, and reactive to light.  Cardiovascular:     Rate and Rhythm: Normal rate and regular rhythm.     Pulses: Normal pulses.     Heart sounds: Normal heart sounds. No murmur heard.    No friction rub. No gallop.  Pulmonary:     Effort: Pulmonary effort is normal.     Breath sounds: Normal breath sounds. No wheezing, rhonchi or rales.  Abdominal:     General: Abdomen is flat. Bowel sounds are normal. There is no distension.     Palpations: Abdomen is soft.     Tenderness: There is no abdominal tenderness.  Musculoskeletal:     Cervical back: Normal range of motion.  Skin:    General: Skin is warm and dry.     Capillary Refill: Capillary refill takes less than 2 seconds.     Coloration: Skin is not jaundiced.  Neurological:     General: No focal deficit present.     Mental Status: She is alert and oriented to person, place, and time.     Gait: Gait abnormal (Ambulates with walker).  Psychiatric:        Mood and Affect: Mood normal.        Behavior: Behavior normal.   Last CBC Lab Results  Component Value Date   WBC 14.5 (H) 01/09/2022   HGB 11.6 (L) 01/09/2022   HCT 33.3 (L) 01/09/2022   MCV 89.0 01/09/2022   MCH 31.0 01/09/2022   RDW 13.1 01/09/2022   PLT 208 26/83/4196   Last metabolic panel Lab Results  Component Value Date   GLUCOSE 130 (H) 01/08/2022   NA 133 (L) 01/08/2022   K 3.6 01/08/2022   CL 100 01/08/2022   CO2 24 01/08/2022   BUN 7 (L) 01/08/2022   CREATININE 0.58 01/08/2022   GFRNONAA >60 01/08/2022   CALCIUM 9.3 01/08/2022   PROT 6.4 07/01/2021   ALBUMIN 4.0 07/01/2021   LABGLOB 2.4 07/01/2021   AGRATIO 1.7 07/01/2021   BILITOT 0.3 07/01/2021   ALKPHOS 104 07/01/2021   AST 23 07/01/2021   ALT 10 07/01/2021   ANIONGAP 9 01/08/2022   Last  lipids Lab Results  Component Value Date   CHOL 167 07/01/2021   HDL 56 07/01/2021   LDLCALC 96 07/01/2021   TRIG 77 07/01/2021   CHOLHDL 3.0 07/01/2021   Last hemoglobin A1c Lab Results  Component Value Date   HGBA1C 5.6 07/01/2021   Last thyroid functions Lab Results  Component Value Date   TSH 1.520 07/01/2021   Last vitamin D Lab Results  Component Value Date   VD25OH 33.0 07/01/2021   Last vitamin B12 and Folate Lab Results  Component Value Date   VITAMINB12 449 07/19/2019   FOLATE 15.3 11/24/2017     Assessment & Plan:   Problem List Items Addressed This Visit       Essential hypertension, benign    She is currently prescribed amlodipine 10 mg daily and HCTZ 25 mg daily for treatment of hypertension.  Her blood pressure today is 127/71. -No medication changes today.  Continue current antihypertensive regimen.  Refills have been provided.      Thrombophlebitis of superficial veins of left lower extremity    Noted on vascular ultrasound of the left lower extremity obtained 12/7.  Prophylactic anticoagulation with Eliquis x 45 days but started the same day.  She has been taking Eliquis as prescribed.  There is no evidence of the previously noted nodularity on the left lower extremity present today.  No rashes present.  She is asymptomatic. -Discontinue Eliquis today.      Acute cystitis without hematuria    Currently endorsing dysuria with subjective fever/chills.  UA with trace LE. -Given her symptoms and UA findings, I have prescribed Macrobid x 5 days -Follow-up urine culture      Return in about 3 months (around 06/22/2022).    Johnette Abraham, MD

## 2022-03-26 LAB — UA/M W/RFLX CULTURE, ROUTINE
Bilirubin, UA: NEGATIVE
Glucose, UA: NEGATIVE
Ketones, UA: NEGATIVE
Nitrite, UA: NEGATIVE
Protein,UA: NEGATIVE
RBC, UA: NEGATIVE
Specific Gravity, UA: 1.005 (ref 1.005–1.030)
Urobilinogen, Ur: 0.2 mg/dL (ref 0.2–1.0)
pH, UA: 6.5 (ref 5.0–7.5)

## 2022-03-26 LAB — MICROSCOPIC EXAMINATION
Casts: NONE SEEN /lpf
RBC, Urine: NONE SEEN /hpf (ref 0–2)

## 2022-03-26 LAB — URINE CULTURE, REFLEX: Organism ID, Bacteria: NO GROWTH

## 2022-04-02 ENCOUNTER — Ambulatory Visit (INDEPENDENT_AMBULATORY_CARE_PROVIDER_SITE_OTHER): Payer: Medicare Other | Admitting: Internal Medicine

## 2022-04-02 ENCOUNTER — Encounter: Payer: Self-pay | Admitting: Internal Medicine

## 2022-04-02 VITALS — BP 107/72 | HR 72 | Temp 98.1°F | Ht 64.0 in | Wt 160.8 lb

## 2022-04-02 DIAGNOSIS — L750 Bromhidrosis: Secondary | ICD-10-CM | POA: Diagnosis not present

## 2022-04-02 NOTE — Assessment & Plan Note (Signed)
Presenting today for an acute visit for evaluation of the symptoms endorsed above.  She is concerned about an abnormal odor that is present even after bathing.  I am not able to detect a foul odor today.  Her surgical wound has healed nicely.  I do not appreciate any clinical findings today that are concerning for infection.  She was recently treated for UTI but states that her symptoms have resolved.  Ultimately, the etiology of the odor she is detecting is not clear to me. -Repeat labs ordered today, including inflammatory markers -She will follow-up with orthopedic surgery next week (2/8) -She has follow-up with me scheduled for April, but was instructed return to care before this date if her symptoms worsen or fail to improve

## 2022-04-02 NOTE — Patient Instructions (Signed)
It was a pleasure to see you today.  Thank you for giving [place] the opportunity to be involved in your care.  Below is a brief recap of your visit and next steps.  We will plan to see you again in April  Summary I do not appreciate any signs of infection today We will check labs as further workup

## 2022-04-02 NOTE — Progress Notes (Signed)
Acute Office Visit  Subjective:     Patient ID: Alexis Davis, female    DOB: 1940/09/13, 82 y.o.   MRN: [id number]  Chief Complaint  Patient presents with   Fatigue    Feels weak, felt like pt has been running a fever   Alexis Davis presents today for an acute visit to discuss symptoms of weakness and an abnormal smell that has been present for 2 weeks.  She was last seen by me on 1/22 for HTN follow-up and was treated for a UTI with Macrobid at that time.  She states that her urinary symptoms have resolved.  She is concerned that the abnormal odor is coming from a surgical wound on her left hip.  She underwent left hip THA in November.  She will see her surgeon and sees (Dr. Lorin Mercy) next week for follow-up.  Alexis Davis has not experienced any recent worsening of pain in her left lower extremity.  She states that she smells a foul odor even after bathing.  She previously detected a similar odor years ago, which ultimately led to surgery.  She is unable to provide further details at this time.  She endorses subjective fever/chills but is afebrile currently.  She has not attempted to take any medications specifically for her current symptoms.  Review of Systems  Constitutional:  Positive for malaise/fatigue.  HENT:         Abnormal smell      Objective:    BP 107/72   Pulse 72   Temp 98.1 F (36.7 C)   Ht '5\' 4"'$  (1.626 m)   Wt 160 lb 12.8 oz (72.9 kg)   SpO2 96%   BMI 27.60 kg/m   Physical Exam Vitals reviewed.  Constitutional:      General: She is not in acute distress.    Appearance: Normal appearance.  HENT:     Head: Normocephalic and atraumatic.     Right Ear: External ear normal.     Left Ear: External ear normal.     Nose: Nose normal. No congestion or rhinorrhea.     Mouth/Throat:     Mouth: Mucous membranes are moist.     Pharynx: Oropharynx is clear. No oropharyngeal exudate or posterior oropharyngeal erythema.  Eyes:     General: No scleral icterus.     Extraocular Movements: Extraocular movements intact.     Conjunctiva/sclera: Conjunctivae normal.     Pupils: Pupils are equal, round, and reactive to light.  Cardiovascular:     Rate and Rhythm: Normal rate and regular rhythm.     Pulses: Normal pulses.     Heart sounds: Normal heart sounds. No murmur heard.    No friction rub. No gallop.  Pulmonary:     Effort: Pulmonary effort is normal.     Breath sounds: Normal breath sounds. No wheezing, rhonchi or rales.  Abdominal:     General: Abdomen is flat. Bowel sounds are normal. There is no distension.     Palpations: Abdomen is soft.     Tenderness: There is no abdominal tenderness.  Musculoskeletal:     Cervical back: Normal range of motion.  Skin:    General: Skin is warm and dry.     Capillary Refill: Capillary refill takes less than 2 seconds.     Coloration: Skin is not jaundiced.     Comments: Well-healed surgical incision present over the proximal aspect of the left lower extremity.  There is no erythema, purulence, or wound dehiscence  present.  There is no significant tenderness to palpation or increased warmth present in the left leg.  Neurological:     General: No focal deficit present.     Mental Status: She is alert and oriented to person, place, and time.     Gait: Gait abnormal (Ambulates with walker).  Psychiatric:        Mood and Affect: Mood normal.        Behavior: Behavior normal.       Assessment & Plan:   Problem List Items Addressed This Visit       Abnormal body odor - Primary    Presenting today for an acute visit for evaluation of the symptoms endorsed above.  She is concerned about an abnormal odor that is present even after bathing.  I am not able to detect a foul odor today.  Her surgical wound has healed nicely.  I do not appreciate any clinical findings today that are concerning for infection.  She was recently treated for UTI but states that her symptoms have resolved.  Ultimately, the etiology of  the odor she is detecting is not clear to me. -Repeat labs ordered today, including inflammatory markers -She will follow-up with orthopedic surgery next week (2/8) -She has follow-up with me scheduled for April, but was instructed return to care before this date if her symptoms worsen or fail to improve      Return if symptoms worsen or fail to improve.  Johnette Abraham, MD

## 2022-04-03 LAB — CBC WITH DIFFERENTIAL/PLATELET
Basophils Absolute: 0.1 10*3/uL (ref 0.0–0.2)
Basos: 1 %
EOS (ABSOLUTE): 0.1 10*3/uL (ref 0.0–0.4)
Eos: 1 %
Hematocrit: 45.4 % (ref 34.0–46.6)
Hemoglobin: 14.4 g/dL (ref 11.1–15.9)
Immature Grans (Abs): 0 10*3/uL (ref 0.0–0.1)
Immature Granulocytes: 0 %
Lymphocytes Absolute: 2.2 10*3/uL (ref 0.7–3.1)
Lymphs: 26 %
MCH: 28.9 pg (ref 26.6–33.0)
MCHC: 31.7 g/dL (ref 31.5–35.7)
MCV: 91 fL (ref 79–97)
Monocytes Absolute: 0.8 10*3/uL (ref 0.1–0.9)
Monocytes: 10 %
Neutrophils Absolute: 5.4 10*3/uL (ref 1.4–7.0)
Neutrophils: 62 %
Platelets: 249 10*3/uL (ref 150–450)
RBC: 4.99 x10E6/uL (ref 3.77–5.28)
RDW: 12.3 % (ref 11.7–15.4)
WBC: 8.6 10*3/uL (ref 3.4–10.8)

## 2022-04-03 LAB — SEDIMENTATION RATE: Sed Rate: 13 mm/hr (ref 0–40)

## 2022-04-03 LAB — C-REACTIVE PROTEIN: CRP: <1 mg/L (ref 0–10)

## 2022-04-09 ENCOUNTER — Encounter: Payer: Self-pay | Admitting: Orthopaedic Surgery

## 2022-04-09 ENCOUNTER — Ambulatory Visit (INDEPENDENT_AMBULATORY_CARE_PROVIDER_SITE_OTHER): Payer: Medicare Other | Admitting: Orthopaedic Surgery

## 2022-04-09 VITALS — Ht 64.0 in | Wt 160.0 lb

## 2022-04-09 DIAGNOSIS — Z96642 Presence of left artificial hip joint: Secondary | ICD-10-CM

## 2022-04-09 NOTE — Progress Notes (Signed)
Post-Op Visit Note   Patient: Alexis Davis           Date of Birth: Sep 13, 1940           MRN: [id number] Visit Date: 04/09/2022 PCP: Johnette Abraham, MD   Assessment & Plan: 82 year old female follow-up left total hip arthroplasty 01/17/2022.  Previously she was doing well she states now she is having some pulling pain that goes across her groin she describes it is the medius tearing.  She is ambulating with a walker taking Tylenol for pain.  Good relief of preop pain pattern.  Chief Complaint:  Chief Complaint  Patient presents with   Left Hip - Routine Post Op, Follow-up    01/07/2022 Left THA   Visit Diagnoses:  1. S/P total left hip arthroplasty     Plan: Patient increased ambulation and progress with strengthening.  X-rays reviewed good position of hip position alignment.  Follow-Up Instructions: No follow-ups on file.   Orders:  No orders of the defined types were placed in this encounter.  No orders of the defined types were placed in this encounter.   Imaging: No results found.  PMFS History: Patient Active Problem List   Diagnosis Date Noted   Abnormal body odor 04/02/2022   Acute cystitis without hematuria 03/23/2022   Thrombophlebitis of superficial veins of left lower extremity 02/05/2022   S/P total left hip arthroplasty 01/29/2022   Acute cough 11/06/2021   Annual physical exam 07/01/2021   Skin mole 07/01/2021   Seasonal allergies 07/19/2019   Peripheral polyneuropathy 11/24/2017   Vitamin D deficiency 06/22/2016   Varicose veins 03/16/2014   Tinea pedis 11/03/2013   OA (osteoarthritis) 11/03/2013   Osteoporosis 12/09/2012   Itching 12/09/2012   PTTD (posterior tibial tendon dysfunction) 09/01/2012   Neuropathy, leg 04/29/2012   Rotator cuff syndrome of right shoulder 11/26/2011   COPD, mild ([place]) 10/22/2008   Essential hypertension, benign 09/25/2008   Osteoarthritis of shoulder 06/12/2008   HEPATIC CYST 03/16/2006   DEGENERATION,  DISC NOS 03/16/2006   Past Medical History:  Diagnosis Date   Arthritis    Shoulder   Compression fracture of L4 lumbar vertebra    Pt did not have pain, osteoporosis   COPD (chronic obstructive pulmonary disease) ([place]) 03/02/2008   PFT   Hepatic cyst    Hypertension    Osteoporosis     Family History  Problem Relation Age of Onset   Diabetes Mother    Heart disease Father    Hypertension Sister    Hypertension Sister    Hypertension Sister    Hypertension Brother    Diabetes Mellitus II Son    Arthritis Other    Breast cancer Neg Hx    Colon cancer Neg Hx    Cancer - Lung Neg Hx     Past Surgical History:  Procedure Laterality Date   ABDOMINAL HYSTERECTOMY     CATARACT EXTRACTION W/PHACO Right 08/23/2014   Procedure: CATARACT EXTRACTION PHACO AND INTRAOCULAR LENS PLACEMENT RIGHT EYE; CDE:  10.80;  Surgeon: Tonny Branch, MD;  Location: [facility];  Service: Ophthalmology;  Laterality: Right;   CATARACT EXTRACTION W/PHACO Left 10/08/2014   Procedure: CATARACT EXTRACTION PHACO AND INTRAOCULAR LENS PLACEMENT ([facility]);  Surgeon: Tonny Branch, MD;  Location: [facility];  Service: Ophthalmology;  Laterality: Left;  CDE 6.60   TOTAL HIP ARTHROPLASTY Left 01/07/2022   Procedure: LEFT TOTAL HIP ARTHROPLASTY ANTERIOR APPROACH;  Surgeon: Marybelle Killings, MD;  Location: [facility];  Service:  Orthopedics;  Laterality: Left;   Social History   Occupational History   Not on file  Tobacco Use   Smoking status: Former    Types: Cigarettes   Smokeless tobacco: Never   Tobacco comments:    She quit smoking in January 2022. Smoked for 20 years, a pack usually last her a week.  Vaping Use   Vaping Use: Never used  Substance and Sexual Activity   Alcohol use: No   Drug use: No   Sexual activity: Yes

## 2022-04-15 ENCOUNTER — Telehealth: Payer: Self-pay | Admitting: *Deleted

## 2022-04-15 NOTE — Telephone Encounter (Signed)
Attempted 90 day Ortho bundle call. No answer and unable to leave a message. Will try again.

## 2022-05-21 ENCOUNTER — Encounter: Payer: Medicare Other | Admitting: Family Medicine

## 2022-05-21 ENCOUNTER — Encounter: Payer: Self-pay | Admitting: Internal Medicine

## 2022-05-29 ENCOUNTER — Ambulatory Visit (INDEPENDENT_AMBULATORY_CARE_PROVIDER_SITE_OTHER): Payer: Medicare Other | Admitting: Internal Medicine

## 2022-05-29 ENCOUNTER — Encounter: Payer: Self-pay | Admitting: Internal Medicine

## 2022-05-29 VITALS — BP 145/73 | HR 68 | Ht 64.0 in | Wt 168.2 lb

## 2022-05-29 DIAGNOSIS — J449 Chronic obstructive pulmonary disease, unspecified: Secondary | ICD-10-CM

## 2022-05-29 MED ORDER — PREDNISONE 20 MG PO TABS: 40.0000 mg | ORAL_TABLET | Freq: Every day | ORAL | 0 refills | Status: AC

## 2022-05-29 MED ORDER — AZITHROMYCIN 250 MG PO TABS: ORAL_TABLET | ORAL | 0 refills | Status: DC

## 2022-05-29 NOTE — Progress Notes (Unsigned)
Acute Office Visit  Subjective:     Patient ID: Alexis Davis, female    DOB: 1940/04/04, 82 y.o.   MRN: [id number]  Chief Complaint  Patient presents with   Nasal Congestion    Chest congestion for 2 weeks now. Tried otc meds   Alexis Davis presents for an acute visit today endorsing chest congestion.  She describes developing sinus congestion 2 weeks ago that has migrated into her chest.  She currently endorses chest congestion with a cough productive of green sputum. Her symptoms have not improved with Mucinex and she has needed to use her albuterol inhaler more frequently.   Review of Systems  Constitutional:  Positive for malaise/fatigue. Negative for chills and fever.  Respiratory:  Positive for cough, sputum production, shortness of breath and wheezing.   All other systems reviewed and are negative.       Objective:    BP (!) 145/73   Pulse 68   Ht 5\' 4"  (1.626 m)   Wt 168 lb 3.2 oz (76.3 kg)   SpO2 97%   BMI 28.87 kg/m    Physical Exam Vitals reviewed.  Constitutional:      General: She is not in acute distress.    Appearance: Normal appearance. She is not toxic-appearing.  HENT:     Head: Normocephalic and atraumatic.     Right Ear: External ear normal.     Left Ear: External ear normal.     Nose: Nose normal. No congestion or rhinorrhea.     Mouth/Throat:     Mouth: Mucous membranes are moist.     Pharynx: Oropharynx is clear. No oropharyngeal exudate or posterior oropharyngeal erythema.  Eyes:     General: No scleral icterus.    Extraocular Movements: Extraocular movements intact.     Conjunctiva/sclera: Conjunctivae normal.     Pupils: Pupils are equal, round, and reactive to light.  Cardiovascular:     Rate and Rhythm: Normal rate and regular rhythm.     Pulses: Normal pulses.     Heart sounds: Normal heart sounds. No murmur heard.    No friction rub. No gallop.  Pulmonary:     Effort: Pulmonary effort is normal.     Breath sounds:  Wheezing (bilateral) present. No rhonchi or rales.  Abdominal:     General: Abdomen is flat. Bowel sounds are normal. There is no distension.     Palpations: Abdomen is soft.     Tenderness: There is no abdominal tenderness.  Musculoskeletal:        General: No swelling. Normal range of motion.     Cervical back: Normal range of motion.     Right lower leg: No edema.     Left lower leg: No edema.  Lymphadenopathy:     Cervical: No cervical adenopathy.  Skin:    General: Skin is warm and dry.     Capillary Refill: Capillary refill takes less than 2 seconds.     Coloration: Skin is not jaundiced.  Neurological:     General: No focal deficit present.     Mental Status: She is alert and oriented to person, place, and time.     Gait: Gait abnormal (Ambulates with walker).  Psychiatric:        Mood and Affect: Mood normal.        Behavior: Behavior normal.       Assessment & Plan:   Problem List Items Addressed This Visit       COPD,  mild - Primary    Presenting today for an acute visit endorsing a cough productive of green sputum with increasing shortness of breath.  She has a history of COPD and has needed to use her rescue inhaler more frequently.  Bilateral wheezes are appreciated on exam today.  Concern for COPD exacerbation. -Z-Pak and prednisone 40 mg x 5 days prescribed for treatment of COPD exacerbation -She is scheduled for routine follow-up with me next month but was instructed return to care if her symptoms worsen or fail to improve.       Meds ordered this encounter  Medications   predniSONE (DELTASONE) 20 MG tablet    Sig: Take 2 tablets (40 mg total) by mouth daily for 5 days.    Dispense:  10 tablet    Refill:  0   azithromycin (ZITHROMAX Z-PAK) 250 MG tablet    Sig: Take 2 tablets (500 mg) PO today, then 1 tablet (250 mg) PO daily x4 days.    Dispense:  6 tablet    Refill:  0    Return if symptoms worsen or fail to improve.  Johnette Abraham, MD

## 2022-05-29 NOTE — Patient Instructions (Signed)
It was a pleasure to see you today.  Thank you for giving [place] the opportunity to be involved in your care.  Below is a brief recap of your visit and next steps.  We will plan to see you again in April  Summary Prednisone and Zpak prescribed for COPD exacerbation Follow up with me next month

## 2022-06-04 ENCOUNTER — Encounter: Payer: Self-pay | Admitting: Internal Medicine

## 2022-06-04 NOTE — Assessment & Plan Note (Signed)
Presenting today for an acute visit endorsing a cough productive of green sputum with increasing shortness of breath.  She has a history of COPD and has needed to use her rescue inhaler more frequently.  Bilateral wheezes are appreciated on exam today.  Concern for COPD exacerbation. -Z-Pak and prednisone 40 mg x 5 days prescribed for treatment of COPD exacerbation -She is scheduled for routine follow-up with me next month but was instructed return to care if her symptoms worsen or fail to improve.

## 2022-06-23 ENCOUNTER — Encounter: Payer: Self-pay | Admitting: Internal Medicine

## 2022-06-23 ENCOUNTER — Encounter: Payer: Medicare Other | Admitting: Internal Medicine

## 2022-06-23 ENCOUNTER — Ambulatory Visit (INDEPENDENT_AMBULATORY_CARE_PROVIDER_SITE_OTHER): Payer: Medicare Other | Admitting: Internal Medicine

## 2022-06-23 VITALS — BP 129/73 | HR 63 | Ht 64.0 in | Wt 165.4 lb

## 2022-06-23 DIAGNOSIS — I10 Essential (primary) hypertension: Secondary | ICD-10-CM

## 2022-06-23 DIAGNOSIS — Z0001 Encounter for general adult medical examination with abnormal findings: Secondary | ICD-10-CM

## 2022-06-23 DIAGNOSIS — M810 Age-related osteoporosis without current pathological fracture: Secondary | ICD-10-CM

## 2022-06-23 DIAGNOSIS — Z1329 Encounter for screening for other suspected endocrine disorder: Secondary | ICD-10-CM | POA: Diagnosis not present

## 2022-06-23 DIAGNOSIS — Z2821 Immunization not carried out because of patient refusal: Secondary | ICD-10-CM

## 2022-06-23 DIAGNOSIS — E559 Vitamin D deficiency, unspecified: Secondary | ICD-10-CM

## 2022-06-23 DIAGNOSIS — J449 Chronic obstructive pulmonary disease, unspecified: Secondary | ICD-10-CM | POA: Diagnosis not present

## 2022-06-23 NOTE — Progress Notes (Signed)
Established Patient Office Visit  Subjective   Patient ID: Alexis Davis, female    DOB: 18-Mar-1940  Age: 82 y.o. MRN: [id number]  Chief Complaint  Patient presents with   Hypertension    Follow up    Alexis Davis returns to care today for routine follow-up.  Last evaluated by me for routine care on 1/22.  She was treated with Macrobid x 5 days for UTI at that time.  In the interim she has been seen by me for acute visits on 2 occasions, most recently 3/29 when she was treated for a COPD exacerbation.  She has also been seen by orthopedic surgery for follow-up.  There have otherwise been no acute interval events.  Alexis Davis reports feeling well today.  She is asymptomatic and has no acute concerns to discuss.  Past Medical History:  Diagnosis Date   Arthritis    Shoulder   Compression fracture of L4 lumbar vertebra    Pt did not have pain, osteoporosis   COPD (chronic obstructive pulmonary disease) (HCC) 03/02/2008   PFT   Hepatic cyst    Hypertension    Osteoporosis    Thrombophlebitis of superficial veins of left lower extremity 02/05/2022   Past Surgical History:  Procedure Laterality Date   ABDOMINAL HYSTERECTOMY     CATARACT EXTRACTION W/PHACO Right 08/23/2014   Procedure: CATARACT EXTRACTION PHACO AND INTRAOCULAR LENS PLACEMENT RIGHT EYE; CDE:  10.80;  Surgeon: Gemma Payor, MD;  Location: [facility];  Service: Ophthalmology;  Laterality: Right;   CATARACT EXTRACTION W/PHACO Left 10/08/2014   Procedure: CATARACT EXTRACTION PHACO AND INTRAOCULAR LENS PLACEMENT (IOC);  Surgeon: Gemma Payor, MD;  Location: [facility];  Service: Ophthalmology;  Laterality: Left;  CDE 6.60   TOTAL HIP ARTHROPLASTY Left 01/07/2022   Procedure: LEFT TOTAL HIP ARTHROPLASTY ANTERIOR APPROACH;  Surgeon: Eldred Manges, MD;  Location: [facility];  Service: Orthopedics;  Laterality: Left;   Social History   Tobacco Use   Smoking status: Former    Types: Cigarettes   Smokeless tobacco: Never   Tobacco comments:     She quit smoking in January 2022. Smoked for 20 years, a pack usually last her a week.  Vaping Use   Vaping Use: Never used  Substance Use Topics   Alcohol use: No   Drug use: No   Family History  Problem Relation Age of Onset   Diabetes Mother    Heart disease Father    Hypertension Sister    Hypertension Sister    Hypertension Sister    Hypertension Brother    Diabetes Mellitus II Son    Arthritis Other    Breast cancer Neg Hx    Colon cancer Neg Hx    Cancer - Lung Neg Hx    No Known Allergies    Review of Systems  Constitutional:  Negative for chills and fever.  HENT:  Negative for sore throat.   Respiratory:  Negative for cough and shortness of breath.   Cardiovascular:  Negative for chest pain, palpitations and leg swelling.  Gastrointestinal:  Negative for abdominal pain, blood in stool, constipation, diarrhea, nausea and vomiting.  Genitourinary:  Negative for dysuria and hematuria.  Musculoskeletal:  Negative for myalgias.  Skin:  Negative for itching and rash.  Neurological:  Negative for dizziness and headaches.  Psychiatric/Behavioral:  Negative for depression and suicidal ideas.      Objective:     BP 129/73   Pulse 63   Ht 5\' 4"  (1.626  m)   Wt 165 lb 6.4 oz (75 kg)   SpO2 97%   BMI 28.39 kg/m  BP Readings from Last 3 Encounters:  06/23/22 129/73  05/29/22 (!) 145/73  04/02/22 107/72   Physical Exam Vitals reviewed.  Constitutional:      General: She is not in acute distress.    Appearance: Normal appearance. She is not toxic-appearing.  HENT:     Head: Normocephalic and atraumatic.     Right Ear: External ear normal.     Left Ear: External ear normal.     Nose: Nose normal. No congestion or rhinorrhea.     Mouth/Throat:     Mouth: Mucous membranes are moist.     Pharynx: Oropharynx is clear. No oropharyngeal exudate or posterior oropharyngeal erythema.  Eyes:     General: No scleral icterus.    Extraocular Movements: Extraocular  movements intact.     Conjunctiva/sclera: Conjunctivae normal.     Pupils: Pupils are equal, round, and reactive to light.  Cardiovascular:     Rate and Rhythm: Normal rate and regular rhythm.     Pulses: Normal pulses.     Heart sounds: Normal heart sounds. No murmur heard.    No friction rub. No gallop.  Pulmonary:     Effort: Pulmonary effort is normal.     Breath sounds: Normal breath sounds. No wheezing, rhonchi or rales.  Abdominal:     General: Abdomen is flat. Bowel sounds are normal. There is no distension.     Palpations: Abdomen is soft.     Tenderness: There is no abdominal tenderness.  Musculoskeletal:        General: No swelling. Normal range of motion.     Cervical back: Normal range of motion.     Right lower leg: No edema.     Left lower leg: No edema.  Lymphadenopathy:     Cervical: No cervical adenopathy.  Skin:    General: Skin is warm and dry.     Capillary Refill: Capillary refill takes less than 2 seconds.     Coloration: Skin is not jaundiced.  Neurological:     General: No focal deficit present.     Mental Status: She is alert and oriented to person, place, and time.  Psychiatric:        Mood and Affect: Mood normal.        Behavior: Behavior normal.   Last CBC Lab Results  Component Value Date   WBC 8.6 04/02/2022   HGB 14.4 04/02/2022   HCT 45.4 04/02/2022   MCV 91 04/02/2022   MCH 28.9 04/02/2022   RDW 12.3 04/02/2022   PLT 249 04/02/2022   Last metabolic panel Lab Results  Component Value Date   GLUCOSE 130 (H) 01/08/2022   NA 133 (L) 01/08/2022   K 3.6 01/08/2022   CL 100 01/08/2022   CO2 24 01/08/2022   BUN 7 (L) 01/08/2022   CREATININE 0.58 01/08/2022   GFRNONAA >60 01/08/2022   CALCIUM 9.3 01/08/2022   PROT 6.4 07/01/2021   ALBUMIN 4.0 07/01/2021   LABGLOB 2.4 07/01/2021   AGRATIO 1.7 07/01/2021   BILITOT 0.3 07/01/2021   ALKPHOS 104 07/01/2021   AST 23 07/01/2021   ALT 10 07/01/2021   ANIONGAP 9 01/08/2022   Last  lipids Lab Results  Component Value Date   CHOL 167 07/01/2021   HDL 56 07/01/2021   LDLCALC 96 07/01/2021   TRIG 77 07/01/2021   CHOLHDL 3.0 07/01/2021  Last hemoglobin A1c Lab Results  Component Value Date   HGBA1C 5.6 07/01/2021   Last thyroid functions Lab Results  Component Value Date   TSH 1.520 07/01/2021   Last vitamin D Lab Results  Component Value Date   VD25OH 33.0 07/01/2021   Last vitamin B12 and Folate Lab Results  Component Value Date   VITAMINB12 449 07/19/2019   FOLATE 15.3 11/24/2017     Assessment & Plan:   Problem List Items Addressed This Visit       Essential hypertension, benign    BP remains well-controlled on current antihypertensive regimen of amlodipine 10 mg daily and HCTZ 25 mg daily. -No medication changes indicated today.      COPD, mild (HCC)    Recently treated for COPD exacerbation.  Has an albuterol inhaler for as needed use but is not currently on maintenance therapy.  States that she seldom needs her albuterol inhaler.  Her pulmonary exam today is unremarkable. -No medication changes today      Osteoporosis - Primary    T-score -2.5 on DEXA from 2014.  She is currently on Fosamax and calcium+ vitamin D supplementation. -Repeat DEXA ordered today       Return in about 6 months (around 12/23/2022).   Billie Lade, MD

## 2022-06-23 NOTE — Patient Instructions (Signed)
It was a pleasure to see you today.  Thank you for giving [place] the opportunity to be involved in your care.  Below is a brief recap of your visit and next steps.  We will plan to see you again in 6 months.  Summary No medication changes today Repeat labs have been ordered. Please have them completed after 07/02/22 Repeat DEXA scan ordered today Follow up in 6 months

## 2022-06-24 ENCOUNTER — Encounter: Payer: Self-pay | Admitting: Internal Medicine

## 2022-06-24 ENCOUNTER — Ambulatory Visit (INDEPENDENT_AMBULATORY_CARE_PROVIDER_SITE_OTHER): Payer: Medicare Other | Admitting: Internal Medicine

## 2022-06-24 DIAGNOSIS — Z0000 Encounter for general adult medical examination without abnormal findings: Secondary | ICD-10-CM | POA: Diagnosis not present

## 2022-06-24 NOTE — Patient Instructions (Signed)
  Alexis Davis , Thank you for taking time to come for your Medicare Wellness Visit. I appreciate your ongoing commitment to your health goals. Please review the following plan we discussed and let me know if I can assist you in the future.   These are the goals we discussed:  Goals      Patient Stated     Patient states that she will work on getting covid boosters up to date     Pharmacy Care Plan:     CARE PLAN ENTRY  Current Barriers:  Chronic Disease Management support, education, and care coordination needs related to Hypertension, COPD, and osteoarthritis.   Hypertension Pharmacist Clinical Goal(s): Over the next 180 days, patient will work with PharmD and providers to maintain BP goal <140/90 Current regimen:  Amlodipine  daily, HCTZ 25 mg daily Interventions: Initiate monitoring plan Comprehensive medication review Patient self care activities - Over the next 180 days, patient will: Purchase new BP monitor  Check BP once daily, document, and provide at future appointments Ensure daily salt intake < 2300 mg/day  COPD Pharmacist Clinical Goal(s) Over the next 180 days, patient will work with PharmD and providers to optimize medication related to COPD Current regimen:  Albuterol inhaler prn Breo Ellipta 100-9mcg Interventions: Comprehensive medication review Continue current therapy Patient self care activities - Over the next 180 days, patient will: Continue to take medications as prescribed Contact PharmD or PCP with any increase in SOB.  Osteoarthritis Pharmacist Clinical Goal(s) Over the next 180 days, patient will work with PharmD and providers to optimize medication related to pain/osteoarthritis. Current regimen:  Tramadol  every 8 hours as needed Interventions: Comprehensive medication review Continue current therapy Patient self care activities - Over the next 180 days, patient will: Continue to take medication as prescribed Contact PharmD  or PCP with any increase in pain symptoms or medication adverse effects   Osteoporosis Pharmacist Clinical Goal(s) Over the next 180 days, patient will work with PharmD and providers to optimize meds and minimize symptoms of Osteoporosis. Current regimen:  Alendronate  weekly Interventions: Recommended repeat bone density scan Counseled on appropriate use of med Patient self care activities - Over the next 180 days, patient will: Continue to take medication as directed Schedule updated bone density scan at [facility]   Please see past updates related to this goal by clicking on the "Past Updates" button in the selected goal          This is a list of the screening recommended for you and due dates:  Health Maintenance  Topic Date Due   Medicare Annual Wellness Visit  05/20/2022   Flu Shot  10/01/2022   Pneumonia Vaccine  Completed   DEXA scan (bone density measurement)  Completed   HPV Vaccine  Aged Out   DTaP/Tdap/Td vaccine  Discontinued   COVID-19 Vaccine  Discontinued   Zoster (Shingles) Vaccine  Discontinued

## 2022-06-24 NOTE — Progress Notes (Signed)
Subjective:  This is a telephone encounter between Alexis Davis and Alexis Davis on 06/24/2022 for AWV. The visit was conducted with the patient located at home and Alexis Davis at [facility]. The patient's identity was confirmed using their DOB and current address. The patient has consented to being evaluated through a telephone encounter and understands the associated risks (an examination cannot be done and the patient may need to come in for an appointment) / benefits (allows the patient to remain at home, decreasing exposure to coronavirus).     Alexis Davis is a 82 y.o. female who presents for Medicare Annual (Subsequent) preventive examination.  Review of Systems    Review of Systems  All other systems reviewed and are negative.    Objective:    There were no vitals filed for this visit. There is no height or weight on file to calculate BMI.     01/07/2022   10:05 AM 12/31/2021    9:55 AM 05/19/2021    3:39 PM 01/17/2019    9:33 AM 08/23/2014    6:52 AM 08/20/2014    8:24 AM  Advanced Directives  Does Patient Have a Medical Advance Directive? Yes Yes No No No No  Type of Advance Directive Living will Living will      Does patient want to make changes to medical advance directive? No - Patient declined No - Patient declined      Would patient like information on creating a medical advance directive?   Yes (ED - Information included in AVS) Yes (MAU/Ambulatory/Procedural Areas - Information given) No - patient declined information No - patient declined information    Current Medications (verified) Outpatient Encounter Medications as of 06/24/2022  Medication Sig   albuterol (VENTOLIN HFA) 108 (90 Base) MCG/ACT inhaler Inhale 2 puffs into the lungs every 4 (four) hours as needed for wheezing or shortness of breath.   alendronate (FOSAMAX) 70 MG tablet Take with a full glass of water on an empty stomach. (Patient taking differently: Take 70 mg by mouth every Sunday. Take  with a full glass of water on an empty stomach.)   amLODipine (NORVASC) 10 MG tablet Take 1 tablet (10 mg total) by mouth daily.   aspirin EC 325 MG tablet Take 325 mg by mouth daily.   Calcium Carbonate-Vitamin D (CALTRATE 600+D PO) Take 1 tablet by mouth 2 (two) times daily.   Cholecalciferol (VITAMIN D3) 25 MCG (1000 UT) CAPS Take 1 capsule (1,000 Units total) by mouth daily.   COD LIVER OIL PO Take 1 capsule by mouth daily.   hydrochlorothiazide (HYDRODIURIL) 25 MG tablet Take 1 tablet (25 mg total) by mouth daily.   No facility-administered encounter medications on file as of 06/24/2022.    Allergies (verified) Patient has no known allergies.   History: Past Medical History:  Diagnosis Date   Arthritis    Shoulder   Compression fracture of L4 lumbar vertebra    Pt did not have pain, osteoporosis   COPD (chronic obstructive pulmonary disease) 03/02/2008   PFT   Hepatic cyst    Hypertension    Osteoporosis    Thrombophlebitis of superficial veins of left lower extremity 02/05/2022   Past Surgical History:  Procedure Laterality Date   ABDOMINAL HYSTERECTOMY     CATARACT EXTRACTION W/PHACO Right 08/23/2014   Procedure: CATARACT EXTRACTION PHACO AND INTRAOCULAR LENS PLACEMENT RIGHT EYE; CDE:  10.80;  Surgeon: Gemma Payor, MD;  Location: [facility];  Service: Ophthalmology;  Laterality: Right;  CATARACT EXTRACTION W/PHACO Left 10/08/2014   Procedure: CATARACT EXTRACTION PHACO AND INTRAOCULAR LENS PLACEMENT (IOC);  Surgeon: Gemma Payor, MD;  Location: [facility];  Service: Ophthalmology;  Laterality: Left;  CDE 6.60   TOTAL HIP ARTHROPLASTY Left 01/07/2022   Procedure: LEFT TOTAL HIP ARTHROPLASTY ANTERIOR APPROACH;  Surgeon: Eldred Manges, MD;  Location: [facility];  Service: Orthopedics;  Laterality: Left;   Family History  Problem Relation Age of Onset   Diabetes Mother    Heart disease Father    Hypertension Sister    Hypertension Sister    Hypertension Sister    Hypertension Brother     Diabetes Mellitus II Son    Arthritis Other    Breast cancer Neg Hx    Colon cancer Neg Hx    Cancer - Lung Neg Hx    Social History   Socioeconomic History   Marital status: Married    Spouse name: Jomarie Longs   Number of children: 3   Years of education: 12   Highest education level: Not on file  Occupational History   Not on file  Tobacco Use   Smoking status: Former    Types: Cigarettes   Smokeless tobacco: Never   Tobacco comments:    She quit smoking in January 2022. Smoked for 20 years, a pack usually last her a week.  Vaping Use   Vaping Use: Never used  Substance and Sexual Activity   Alcohol use: No   Drug use: No   Sexual activity: Yes  Other Topics Concern   Not on file  Social History Narrative   Lives at home with her husband, has 3 children, pt is retired.   Social Determinants of Health   Financial Resource Strain: Low Risk  (07/18/2019)   Overall Financial Resource Strain (CARDIA)    Difficulty of Paying Living Expenses: Not very hard  Food Insecurity: No Food Insecurity (01/07/2022)   Hunger Vital Sign    Worried About Running Out of Food in the Last Year: Never true    Ran Out of Food in the Last Year: Never true  Transportation Needs: No Transportation Needs (01/07/2022)   PRAPARE - Administrator, Civil Service (Medical): No    Lack of Transportation (Non-Medical): No  Physical Activity: Not on file  Stress: Not on file  Social Connections: Not on file    Tobacco Counseling Counseling given: Not Answered Tobacco comments: She quit smoking in January 2022. Smoked for 20 years, a pack usually last her a week.   Clinical Intake:  Pre-visit preparation completed: Yes  Pain : No/denies pain     Diabetes: No  How often do you need to have someone help you when you read instructions, pamphlets, or other written materials from your doctor or pharmacy?: 1 - Never What is the last grade level you completed in school?: 12th  grade     Activities of Daily Living    06/24/2022   10:15 AM 01/07/2022    5:00 PM  In your present state of health, do you have any difficulty performing the following activities:  Hearing? 0 0  Vision? 0 0  Difficulty concentrating or making decisions? 0 0  Walking or climbing stairs? 0 0  Dressing or bathing? 0 0  Doing errands, shopping? 0 0    Patient Care Team: Billie Lade, MD as PCP - General (Internal Medicine) Maisie Fus, MD as PCP - Cardiology (Cardiology) Erroll Luna, [facility] as Pharmacist (Pharmacist)  Indicate any recent Medical Services you may have received from other than [facility] providers in the past year (date may be approximate).     Assessment:   This is a routine wellness examination for [facility].  Hearing/Vision screen No results found.  Dietary issues and exercise activities discussed:     Goals Addressed   None    Depression Screen    06/24/2022   10:15 AM 06/23/2022   10:27 AM 05/29/2022    2:48 PM 04/02/2022    9:01 AM 03/23/2022   10:24 AM 02/05/2022   10:37 AM 12/04/2021   10:40 AM  PHQ 2/9 Scores  PHQ - 2 Score 0 0 0 0 0 0 0  PHQ- 9 Score   3 3 0 0     Fall Risk    06/24/2022   10:15 AM 06/23/2022   10:26 AM 05/29/2022    2:48 PM 04/02/2022    9:01 AM 03/23/2022   10:24 AM  Fall Risk   Falls in the past year? 0 0 0 0 0  Number falls in past yr:  0 0 0 0  Injury with Fall?  0 0 0 0  Risk for fall due to :   No Fall Risks No Fall Risks No Fall Risks  Follow up   Falls evaluation completed Falls evaluation completed Falls evaluation completed    FALL RISK PREVENTION PERTAINING TO THE HOME:  Any stairs in or around the home? Yes  If so, are there any without handrails? Yes  Home free of loose throw rugs in walkways, pet beds, electrical cords, etc? Yes  Adequate lighting in your home to reduce risk of falls? Yes   ASSISTIVE DEVICES UTILIZED TO PREVENT FALLS:  Life alert? No  Use of a cane, walker or w/c? No  Grab  bars in the bathroom? Yes  Shower chair or bench in shower? No  Elevated toilet seat or a handicapped toilet? Yes    Cognitive Function:        06/24/2022   10:15 AM 05/19/2021    3:45 PM  6CIT Screen  What Year? 0 points 0 points  What month? 0 points 0 points  What time? 0 points 0 points  Count back from 20 2 points 0 points  Months in reverse 0 points 2 points  Repeat phrase 0 points 2 points  Total Score 2 points 4 points    Immunizations Immunization History  Administered Date(s) Administered   Influenza,inj,Quad PF,6+ Mos 12/09/2012   Moderna SARS-COV2 Booster Vaccination 01/30/2020   Moderna Sars-Covid-2 Vaccination 04/28/2019, 05/26/2019   Pneumococcal Conjugate-13 06/09/2013   Pneumococcal Polysaccharide-23 04/23/2005    TDAP status: Due, Education has been provided regarding the importance of this vaccine. Advised may receive this vaccine at local pharmacy or Health Dept. Aware to provide a copy of the vaccination record if obtained from local pharmacy or Health Dept. Verbalized acceptance and understanding.  Flu Vaccine status: Declined, Education has been provided regarding the importance of this vaccine but patient still declined. Advised may receive this vaccine at local pharmacy or Health Dept. Aware to provide a copy of the vaccination record if obtained from local pharmacy or Health Dept. Verbalized acceptance and understanding.  Pneumococcal vaccine status: Completed during today's visit.  Covid-19 vaccine status: Declined, Education has been provided regarding the importance of this vaccine but patient still declined. Advised may receive this vaccine at local pharmacy or Health Dept.or vaccine clinic. Aware to provide a copy of the vaccination record  if obtained from local pharmacy or Health Dept. Verbalized acceptance and understanding.  Qualifies for Shingles Vaccine? Yes   Zostavax completed No   Shingrix Completed?: No.    Education has been provided  regarding the importance of this vaccine. Patient has been advised to call insurance company to determine out of pocket expense if they have not yet received this vaccine. Advised may also receive vaccine at local pharmacy or Health Dept. Verbalized acceptance and understanding.  Screening Tests Health Maintenance  Topic Date Due   DTaP/Tdap/Td (1 - Tdap) Never done   Medicare Annual Wellness (AWV)  05/20/2022   INFLUENZA VACCINE  10/01/2022   Pneumonia Vaccine 75+ Years old  Completed   DEXA SCAN  Completed   HPV VACCINES  Aged Out   COVID-19 Vaccine  Discontinued   Zoster Vaccines- Shingrix  Discontinued    Health Maintenance  Health Maintenance Due  Topic Date Due   DTaP/Tdap/Td (1 - Tdap) Never done   Medicare Annual Wellness (AWV)  05/20/2022    Colorectal cancer screening: No longer required.   Mammogram status: No longer required due to age.  Bone Density status: Ordered 06/29/2022. Pt provided with contact info and advised to call to schedule appt.  Lung Cancer Screening: (Low Dose CT Chest recommended if Age 76-80 years, 30 pack-year currently smoking OR have quit w/in 15years.) does not qualify.    Additional Screening:  Hepatitis C Screening: does not qualify Vision Screening: Recommended annual ophthalmology exams for early detection of glaucoma and other disorders of the eye. Is the patient up to date with their annual eye exam?  Yes  Who is the provider or what is the name of the office in which the patient attends annual eye exams? [facility] If pt is not established with a provider, would they like to be referred to a provider to establish care? No .   Dental Screening: Recommended annual dental exams for proper oral hygiene  Community Resource Referral / Chronic Care Management: CRR required this visit?  No   CCM required this visit?  No      Plan:     I have personally reviewed and noted the following in the patient's chart:   Medical and social  history Use of alcohol, tobacco or illicit drugs  Current medications and supplements including opioid prescriptions. Patient is not currently taking opioid prescriptions. Functional ability and status Nutritional status Physical activity Advanced directives List of other physicians Hospitalizations, surgeries, and ER visits in previous 12 months Vitals Screenings to include cognitive, depression, and falls Referrals and appointments  In addition, I have reviewed and discussed with patient certain preventive protocols, quality metrics, and best practice recommendations. A written personalized care plan for preventive services as well as general preventive health recommendations were provided to patient.     Alexis Banister, MD   06/24/2022

## 2022-06-29 ENCOUNTER — Encounter: Payer: Self-pay | Admitting: Internal Medicine

## 2022-06-29 ENCOUNTER — Ambulatory Visit (HOSPITAL_COMMUNITY)
Admission: RE | Admit: 2022-06-29 | Discharge: 2022-06-29 | Disposition: A | Discharge status: Home / Self Care | Payer: Medicare Other | Source: Ambulatory Visit | Attending: Internal Medicine | Admitting: Internal Medicine

## 2022-06-29 ENCOUNTER — Other Ambulatory Visit: Payer: Self-pay | Admitting: Internal Medicine

## 2022-06-29 DIAGNOSIS — M810 Age-related osteoporosis without current pathological fracture: Secondary | ICD-10-CM | POA: Diagnosis not present

## 2022-06-29 DIAGNOSIS — M8589 Other specified disorders of bone density and structure, multiple sites: Secondary | ICD-10-CM | POA: Diagnosis not present

## 2022-06-29 DIAGNOSIS — Z780 Asymptomatic menopausal state: Secondary | ICD-10-CM | POA: Diagnosis not present

## 2022-06-29 NOTE — Assessment & Plan Note (Signed)
BP remains well-controlled on current antihypertensive regimen of amlodipine 10 mg daily and HCTZ 25 mg daily. -No medication changes indicated today.

## 2022-06-29 NOTE — Assessment & Plan Note (Signed)
Recently treated for COPD exacerbation.  Has an albuterol inhaler for as needed use but is not currently on maintenance therapy.  States that she seldom needs her albuterol inhaler.  Her pulmonary exam today is unremarkable. -No medication changes today

## 2022-06-29 NOTE — Assessment & Plan Note (Signed)
T-score -2.5 on DEXA from 2014.  She is currently on Fosamax and calcium+ vitamin D supplementation. -Repeat DEXA ordered today

## 2022-09-02 ENCOUNTER — Ambulatory Visit: Payer: Medicare Other | Admitting: Internal Medicine

## 2022-09-02 ENCOUNTER — Inpatient Hospital Stay (HOSPITAL_COMMUNITY)
Admission: EM | Admit: 2022-09-02 | Discharge: 2022-09-05 | DRG: 379 | Disposition: A | Discharge status: Home / Self Care | Payer: Medicare Other | Attending: Internal Medicine | Admitting: Internal Medicine

## 2022-09-02 ENCOUNTER — Emergency Department (HOSPITAL_COMMUNITY): Payer: Medicare Other

## 2022-09-02 ENCOUNTER — Other Ambulatory Visit: Payer: Self-pay

## 2022-09-02 ENCOUNTER — Encounter (HOSPITAL_COMMUNITY): Payer: Self-pay | Admitting: Emergency Medicine

## 2022-09-02 DIAGNOSIS — Z79899 Other long term (current) drug therapy: Secondary | ICD-10-CM

## 2022-09-02 DIAGNOSIS — Z66 Do not resuscitate: Secondary | ICD-10-CM | POA: Diagnosis not present

## 2022-09-02 DIAGNOSIS — D125 Benign neoplasm of sigmoid colon: Secondary | ICD-10-CM | POA: Diagnosis present

## 2022-09-02 DIAGNOSIS — K5731 Diverticulosis of large intestine without perforation or abscess with bleeding: Secondary | ICD-10-CM | POA: Diagnosis not present

## 2022-09-02 DIAGNOSIS — Z8261 Family history of arthritis: Secondary | ICD-10-CM | POA: Diagnosis not present

## 2022-09-02 DIAGNOSIS — Z87891 Personal history of nicotine dependence: Secondary | ICD-10-CM

## 2022-09-02 DIAGNOSIS — K922 Gastrointestinal hemorrhage, unspecified: Secondary | ICD-10-CM | POA: Diagnosis not present

## 2022-09-02 DIAGNOSIS — K921 Melena: Secondary | ICD-10-CM | POA: Diagnosis not present

## 2022-09-02 DIAGNOSIS — I10 Essential (primary) hypertension: Secondary | ICD-10-CM | POA: Diagnosis not present

## 2022-09-02 DIAGNOSIS — K625 Hemorrhage of anus and rectum: Secondary | ICD-10-CM | POA: Insufficient documentation

## 2022-09-02 DIAGNOSIS — N201 Calculus of ureter: Secondary | ICD-10-CM | POA: Diagnosis present

## 2022-09-02 DIAGNOSIS — Z8249 Family history of ischemic heart disease and other diseases of the circulatory system: Secondary | ICD-10-CM | POA: Diagnosis not present

## 2022-09-02 DIAGNOSIS — Z833 Family history of diabetes mellitus: Secondary | ICD-10-CM | POA: Diagnosis not present

## 2022-09-02 DIAGNOSIS — K769 Liver disease, unspecified: Secondary | ICD-10-CM | POA: Diagnosis not present

## 2022-09-02 DIAGNOSIS — D128 Benign neoplasm of rectum: Secondary | ICD-10-CM | POA: Diagnosis present

## 2022-09-02 DIAGNOSIS — Z96642 Presence of left artificial hip joint: Secondary | ICD-10-CM | POA: Diagnosis present

## 2022-09-02 DIAGNOSIS — G629 Polyneuropathy, unspecified: Secondary | ICD-10-CM | POA: Diagnosis present

## 2022-09-02 DIAGNOSIS — K449 Diaphragmatic hernia without obstruction or gangrene: Secondary | ICD-10-CM | POA: Diagnosis not present

## 2022-09-02 DIAGNOSIS — M810 Age-related osteoporosis without current pathological fracture: Secondary | ICD-10-CM | POA: Diagnosis not present

## 2022-09-02 DIAGNOSIS — J449 Chronic obstructive pulmonary disease, unspecified: Secondary | ICD-10-CM | POA: Diagnosis present

## 2022-09-02 DIAGNOSIS — Z7982 Long term (current) use of aspirin: Secondary | ICD-10-CM

## 2022-09-02 DIAGNOSIS — K5730 Diverticulosis of large intestine without perforation or abscess without bleeding: Secondary | ICD-10-CM | POA: Diagnosis not present

## 2022-09-02 LAB — CBC
HCT: 39.6 % (ref 36.0–46.0)
Hemoglobin: 13 g/dL (ref 12.0–15.0)
MCH: 30 pg (ref 26.0–34.0)
MCHC: 32.8 g/dL (ref 30.0–36.0)
MCV: 91.5 fL (ref 80.0–100.0)
Platelets: 228 10*3/uL (ref 150–400)
RBC: 4.33 MIL/uL (ref 3.87–5.11)
RDW: 13.2 % (ref 11.5–15.5)
WBC: 9.3 10*3/uL (ref 4.0–10.5)
nRBC: 0 % (ref 0.0–0.2)

## 2022-09-02 LAB — COMPREHENSIVE METABOLIC PANEL
ALT: 15 U/L (ref 0–44)
AST: 20 U/L (ref 15–41)
Albumin: 3.5 g/dL (ref 3.5–5.0)
Alkaline Phosphatase: 60 U/L (ref 38–126)
Anion gap: 8 (ref 5–15)
BUN: 15 mg/dL (ref 8–23)
CO2: 25 mmol/L (ref 22–32)
Calcium: 9.2 mg/dL (ref 8.9–10.3)
Chloride: 103 mmol/L (ref 98–111)
Creatinine, Ser: 0.58 mg/dL (ref 0.44–1.00)
GFR, Estimated: >60 mL/min (ref >60)
Glucose, Bld: 103 mg/dL — ABNORMAL HIGH (ref 70–99)
Potassium: 3.8 mmol/L (ref 3.5–5.1)
Sodium: 136 mmol/L (ref 135–145)
Total Bilirubin: 0.5 mg/dL (ref 0.3–1.2)
Total Protein: 6.4 g/dL — ABNORMAL LOW (ref 6.5–8.1)

## 2022-09-02 LAB — TYPE AND SCREEN
ABO/RH(D): B POS
Antibody Screen: NEGATIVE

## 2022-09-02 LAB — POC OCCULT BLOOD, ED: Fecal Occult Bld: POSITIVE — AB

## 2022-09-02 MED ORDER — HYDROCHLOROTHIAZIDE 25 MG PO TABS
25.0000 mg | ORAL_TABLET | Freq: Every day | ORAL | Status: DC
  Administered 2022-09-02: 25 mg via ORAL
  Filled 2022-09-02 (×2): qty 1

## 2022-09-02 MED ORDER — IOHEXOL 300 MG/ML  SOLN
100.0000 mL | Freq: Once | INTRAMUSCULAR | Status: AC | PRN
  Administered 2022-09-02: 100 mL via INTRAVENOUS

## 2022-09-02 MED ORDER — AMLODIPINE BESYLATE 5 MG PO TABS
10.0000 mg | ORAL_TABLET | Freq: Every day | ORAL | Status: DC
  Administered 2022-09-02: 10 mg via ORAL
  Filled 2022-09-02 (×2): qty 2

## 2022-09-02 MED ORDER — PANTOPRAZOLE SODIUM 40 MG IV SOLR
40.0000 mg | INTRAVENOUS | Status: DC
  Administered 2022-09-02 – 2022-09-03 (×2): 40 mg via INTRAVENOUS
  Filled 2022-09-02 (×2): qty 10

## 2022-09-02 MED ORDER — ALBUTEROL SULFATE (2.5 MG/3ML) 0.083% IN NEBU: 3.0000 mL | INHALATION_SOLUTION | RESPIRATORY_TRACT | Status: DC | PRN

## 2022-09-02 MED ORDER — PANTOPRAZOLE SODIUM 40 MG IV SOLR: 40.0000 mg | Freq: Once | INTRAVENOUS | Status: DC

## 2022-09-02 NOTE — ED Provider Notes (Signed)
[place] EMERGENCY DEPARTMENT AT [facility] Provider Note   CSN: [id number] Arrival date & time: 09/02/22  1210     History  Chief Complaint  Patient presents with   Rectal Bleeding    Alexis Davis is a 82 y.o. female with a history including hypertension, COPD osteoarthritis and leg neuropathy takes a daily adult aspirin 325 mg, no other blood thinning medications presenting with a 3-day history of rectal bleeding, stating she is seeing dark red blood with clots in her stools, twice per day since her symptoms began.  She denies history of GERD, PUD, also denies abdominal pain, nausea or vomiting, no history of hemorrhoids.  She denies diarrhea, reporting is having fairly formed stools.  She was seen by her primary provider today and per patient report she was able to show them a bloody stool with a bowel movement they are and was promptly sent here for further evaluation.  She states she was feeling lightheaded but this resolved after her last bowel movement.  The history is provided by the patient.       Home Medications Prior to Admission medications   Medication Sig Start Date End Date Taking? Authorizing Provider  albuterol (VENTOLIN HFA) 108 (90 Base) MCG/ACT inhaler Inhale 2 puffs into the lungs every 4 (four) hours as needed for wheezing or shortness of breath. 09/28/18   Torrance, Velna Hatchet, MD  amLODipine (NORVASC) 10 MG tablet Take 1 tablet (10 mg total) by mouth daily. 03/23/22   Billie Lade, MD  aspirin EC 325 MG tablet Take 325 mg by mouth daily.    [provider]  Calcium Carbonate-Vitamin D (CALTRATE 600+D PO) Take 1 tablet by mouth 2 (two) times daily.    [provider]  Cholecalciferol (VITAMIN D3) 25 MCG (1000 UT) CAPS Take 1 capsule (1,000 Units total) by mouth daily. 03/14/21   Paseda, Baird Kay, FNP  COD LIVER OIL PO Take 1 capsule by mouth daily.    [provider]  hydrochlorothiazide (HYDRODIURIL) 25 MG tablet  Take 1 tablet (25 mg total) by mouth daily. 03/23/22   Billie Lade, MD      Allergies    Patient has no known allergies.    Review of Systems   Review of Systems  Constitutional:  Negative for fever.  HENT:  Negative for congestion and sore throat.   Eyes: Negative.   Respiratory:  Negative for chest tightness and shortness of breath.   Cardiovascular:  Negative for chest pain.  Gastrointestinal:  Positive for blood in stool. Negative for abdominal pain, constipation, diarrhea, nausea, rectal pain and vomiting.  Genitourinary: Negative.   Musculoskeletal:  Negative for arthralgias, joint swelling and neck pain.  Skin: Negative.  Negative for rash and wound.  Neurological:  Negative for dizziness, weakness, light-headedness, numbness and headaches.  Psychiatric/Behavioral: Negative.      Physical Exam Updated Vital Signs BP (!) 145/75   Pulse 63   Temp 97.6 F (36.4 C)   Resp 18   Ht 5\' 4"  (1.626 m)   Wt 74.4 kg   SpO2 100%   BMI 28.15 kg/m  Physical Exam Vitals and nursing note reviewed.  Constitutional:      Appearance: She is well-developed.  HENT:     Head: Normocephalic and atraumatic.  Eyes:     Conjunctiva/sclera: Conjunctivae normal.  Cardiovascular:     Rate and Rhythm: Normal rate and regular rhythm.     Heart sounds: Normal heart sounds.  Pulmonary:     Effort: Pulmonary effort is normal.     Breath sounds: Normal breath sounds. No wheezing.  Abdominal:     General: Bowel sounds are normal.     Palpations: Abdomen is soft.     Tenderness: There is no abdominal tenderness. There is no guarding or rebound.  Musculoskeletal:        General: Normal range of motion.     Cervical back: Normal range of motion.  Skin:    General: Skin is warm and dry.  Neurological:     Mental Status: She is alert.     ED Results / Procedures / Treatments   Labs (all labs ordered are listed, but only abnormal results are displayed) Labs Reviewed  COMPREHENSIVE  METABOLIC PANEL - Abnormal; Notable for the following components:      Result Value   Glucose, Bld 103 (*)    Total Protein 6.4 (*)    All other components within normal limits  POC OCCULT BLOOD, ED - Abnormal; Notable for the following components:   Fecal Occult Bld POSITIVE (*)    All other components within normal limits  CBC  TYPE AND SCREEN    EKG None  Radiology CT ABDOMEN PELVIS W CONTRAST  Result Date: 09/02/2022 CLINICAL DATA:  Rectal bleeding for 5 days. Worsening today and yesterday. EXAM: CT ABDOMEN AND PELVIS WITH CONTRAST TECHNIQUE: Multidetector CT imaging of the abdomen and pelvis was performed using the standard protocol following bolus administration of intravenous contrast. RADIATION DOSE REDUCTION: This exam was performed according to the departmental dose-optimization program which includes automated exposure control, adjustment of the mA and/or kV according to patient size and/or use of iterative reconstruction technique. CONTRAST:  Abdominal MRI 07/04/2004 COMPARISON:  None Available. FINDINGS: Lower chest: The lung bases are clear. The imaged heart is unremarkable. Hepatobiliary: There is a peripherally calcified hypodense lesion in the right hepatic lobe measuring up to 2.9 cm. This is in the location of a large cyst seen on the abdominal MRI from 2006. Findings are consistent with a benign lesion requiring no specific imaging follow-up. The liver is otherwise unremarkable. The gallbladder is unremarkable. There is no biliary ductal dilatation. Pancreas: Unremarkable. Spleen: Unremarkable. Adrenals/Urinary Tract: There is a 1.5 cm left adrenal nodule measuring 55 Hounsfield units on the portal venous phase and 14 Hounsfield units on the delayed phase, most in keeping with a benign adenoma requiring no specific imaging follow-up. The right adrenal is unremarkable. There is a 0.8 cm stone in the proximal right ureter with mild fullness of the collecting system. No other stones  are seen in either kidney or along the course of either ureter. There is no left hydronephrosis or hydroureter. There are no suspicious parenchymal lesions. The bladder is unremarkable. Stomach/Bowel: There is a small hiatal hernia. The stomach is otherwise unremarkable. There is no evidence of bowel obstruction. There is no evidence of bowel obstruction. There is no abnormal bowel wall thickening or inflammatory change. There is extensive colonic diverticulosis without evidence of acute diverticulitis. Note that evaluation for active extravasation into the bowel is limited on this single-phase study. The appendix is not definitively identified. Vascular/Lymphatic: There is extensive calcified plaque throughout the nonaneurysmal abdominal aorta. The major branch vessels are patent. The main portal and splenic veins are patent. Incidental note is made of a circumaortic left renal vein. There is no abdominopelvic lymphadenopathy. Reproductive: The uterus is surgically absent. There is no adnexal mass. Other: There is no ascites or free  air. Musculoskeletal: Postsurgical changes reflecting left hip arthroplasty are noted. There is age-indeterminate compression deformity of the L4 and L5 vertebral bodies. Otherwise, there is no evidence of acute osseous abnormality or suspicious osseous lesion. IMPRESSION: 1. Extensive colonic diverticulosis without evidence of acute diverticulitis. Note that evaluation for active extravasation into the bowel is limited on this single-phase study. 2. 0.8 cm stone in the proximal right ureter with mild fullness of the collecting system. 3. Age-indeterminate compression deformities of the L4 and L5 vertebral bodies. Correlate with point tenderness. Electronically Signed   By: Lesia Hausen M.D.   On: 09/02/2022 15:25    Procedures Procedures    Medications Ordered in ED Medications  pantoprazole (PROTONIX) injection 40 mg (40 mg Intravenous Given 09/02/22 1701)  iohexol (OMNIPAQUE)  300 MG/ML solution 100 mL (100 mLs Intravenous Contrast Given 09/02/22 1452)    ED Course/ Medical Decision Making/ A&P Clinical Course as of 09/02/22 1949  Wed Sep 02, 2022  1442 Pleasant 82 year old female not on blood thinners here with painless rectal bleeding.  She is otherwise well-appearing with benign abdominal exam.  Hemoglobin and white count normal.  Will get a CT and reviewed with GI regarding if inpatient versus outpatient follow-up appropriate. [MB]    Clinical Course User Index [MB] Terrilee Files, MD                             Medical Decision Making Patient presenting with painless rectal bleeding with bowel movements, passing dark blood and clots over the past 5 days.  2 bowel movements daily on average with the symptom.  Labs and CT imaging are reassuring, although she does have significant diverticular disease on the CT scan.  Her labs are reassuring with a normal hemoglobin at 13.0.  C-Met normal, she is grossly visibly Hemoccult positive on rectal exam.  No hemorrhoids or other external source visualized.  Amount and/or Complexity of Data Reviewed Labs: ordered.    Details: Per above. Radiology: ordered.    Details: Diverticulosis, no other acute findings. Discussion of management or test interpretation with external provider(s): Patient discussed with Dr. Jena Gauss who has provided a GI consult in the ED, recommended overnight observation to ensure that patient is stable with this rectal bleeding, he would not consider emergent colonoscopy unless her symptoms worsen or her hemoglobin drops.  Could potentially be discharged home with close follow-up with him if she is stable overnight.  Call placed to Dr. Debby Bud who accepts patient for admission.  Risk Decision regarding hospitalization.           Final Clinical Impression(s) / ED Diagnoses Final diagnoses:  Lower GI bleed    Rx / DC Orders ED Discharge Orders     None         Victoriano Lain 09/02/22 1950    Terrilee Files, MD 09/03/22 [phone]

## 2022-09-02 NOTE — H&P (Signed)
History and Physical    MCKINLEIGH BELD ZOX:[id number] DOB: 07-May-1940 DOA: 09/02/2022  DOS: the patient was seen and examined on 09/02/2022  PCP: Billie Lade, MD   Patient coming from: Home  I have personally briefly reviewed patient's old medical records in [facility]  Mrs. Wimes, a very pleasant 82 y/o with OA, peripheral polyneuropathy and no GI history started having blood per rectum 48 hrs prior to admission. She has had no pain or discomfort, no fevers, no change in bowel habit prior to the onset of symptoms, no weight loss. She went to her PCP, Dr. Edilia Bo, and had a blood stool there and was subsequently referred to [facility] for evaluation.    ED Course: T 97.6  145/75  HR 63 RR 18. Gen'l - WNWD woman in no distress. Patient with Hgb 13.0, Cmet nl. CT reveals diverticulosis. Ms Jeanmarie Hubert, AGNP-C for Dr. Jena Gauss has seen patient. TRH asked to bring in on observation status to monitor for continued rectal bleeding.   Review of Systems:  Review of Systems  Constitutional: Negative.   HENT: Negative.    Eyes: Negative.   Respiratory: Negative.    Cardiovascular: Negative.   Gastrointestinal: Negative.   Genitourinary: Negative.   Musculoskeletal: Negative.   Skin: Negative.   Neurological: Negative.   Endo/Heme/Allergies: Negative.   Psychiatric/Behavioral: Negative.      Past Medical History:  Diagnosis Date   Arthritis    Shoulder   Compression fracture of L4 lumbar vertebra    Pt did not have pain, osteoporosis   COPD (chronic obstructive pulmonary disease) (HCC) 03/02/2008   PFT   Hepatic cyst    Hypertension    Osteoporosis    Thrombophlebitis of superficial veins of left lower extremity 02/05/2022    Past Surgical History:  Procedure Laterality Date   ABDOMINAL HYSTERECTOMY     CATARACT EXTRACTION W/PHACO Right 08/23/2014   Procedure: CATARACT EXTRACTION PHACO AND INTRAOCULAR LENS PLACEMENT RIGHT EYE; CDE:  10.80;  Surgeon: Gemma Payor, MD;  Location:  [facility];  Service: Ophthalmology;  Laterality: Right;   CATARACT EXTRACTION W/PHACO Left 10/08/2014   Procedure: CATARACT EXTRACTION PHACO AND INTRAOCULAR LENS PLACEMENT (IOC);  Surgeon: Gemma Payor, MD;  Location: [facility];  Service: Ophthalmology;  Laterality: Left;  CDE 6.60   TOTAL HIP ARTHROPLASTY Left 01/07/2022   Procedure: LEFT TOTAL HIP ARTHROPLASTY ANTERIOR APPROACH;  Surgeon: Eldred Manges, MD;  Location: [facility];  Service: Orthopedics;  Laterality: Left;   Soc Hx - married 62 years. 3 children, 7 grandchildren. She worked for [employer]. Lives with her husband, I-ADLs    reports that she has quit smoking. Her smoking use included cigarettes. She has never used smokeless tobacco. She reports that she does not drink alcohol and does not use drugs.  No Known Allergies  Family History  Problem Relation Age of Onset   Diabetes Mother    Heart disease Father    Hypertension Sister    Hypertension Sister    Hypertension Sister    Hypertension Brother    Diabetes Mellitus II Son    Arthritis Other    Breast cancer Neg Hx    Colon cancer Neg Hx    Cancer - Lung Neg Hx     Prior to Admission medications   Medication Sig Start Date End Date Taking? Authorizing Provider  albuterol (VENTOLIN HFA) 108 (90 Base) MCG/ACT inhaler Inhale 2 puffs into the lungs every 4 (four) hours as needed for wheezing or shortness  of breath. 09/28/18   Salley Scarlet, MD  amLODipine (NORVASC) 10 MG tablet Take 1 tablet (10 mg total) by mouth daily. 03/23/22   Billie Lade, MD  aspirin EC 325 MG tablet Take 325 mg by mouth daily.    [provider]  Calcium Carbonate-Vitamin D (CALTRATE 600+D PO) Take 1 tablet by mouth 2 (two) times daily.    [provider]  Cholecalciferol (VITAMIN D3) 25 MCG (1000 UT) CAPS Take 1 capsule (1,000 Units total) by mouth daily. 03/14/21   Paseda, Baird Kay, FNP  COD LIVER OIL PO Take 1 capsule by mouth daily.    [provider]   hydrochlorothiazide (HYDRODIURIL) 25 MG tablet Take 1 tablet (25 mg total) by mouth daily. 03/23/22   Billie Lade, MD    Physical Exam: Vitals:   09/02/22 1830 09/02/22 1930 09/02/22 2105 09/02/22 2110  BP: 128/71 (!) 145/75 132/70   Pulse: (!) 57 63 61 63  Resp:  18 18   Temp:    98 F (36.7 C)  TempSrc:    Oral  SpO2: 97% 100% 99% 98%  Weight:      Height:        Physical Exam Vitals and nursing note reviewed.  Constitutional:      General: She is not in acute distress.    Appearance: Normal appearance. She is normal weight. She is not ill-appearing.  HENT:     Head: Normocephalic and atraumatic.     Mouth/Throat:     Mouth: Mucous membranes are moist.     Pharynx: Oropharynx is clear. No oropharyngeal exudate.  Eyes:     Extraocular Movements: Extraocular movements intact.     Pupils: Pupils are equal, round, and reactive to light.  Cardiovascular:     Rate and Rhythm: Normal rate and regular rhythm.     Pulses: Normal pulses.     Heart sounds: Normal heart sounds.  Pulmonary:     Effort: Pulmonary effort is normal.     Breath sounds: Normal breath sounds. No wheezing or rales.  Abdominal:     General: Bowel sounds are normal. There is no distension.     Palpations: Abdomen is soft.     Tenderness: There is no abdominal tenderness. There is no guarding.  Musculoskeletal:        General: Normal range of motion.     Cervical back: Normal range of motion and neck supple.  Skin:    General: Skin is warm and dry.  Neurological:     General: No focal deficit present.     Mental Status: She is alert and oriented to person, place, and time. Mental status is at baseline.     Cranial Nerves: No cranial nerve deficit.  Psychiatric:        Mood and Affect: Mood normal.        Behavior: Behavior normal.      Labs on Admission: I have personally reviewed following labs and imaging studies  CBC: Recent Labs  Lab 09/02/22 1315  WBC 9.3  HGB 13.0  HCT 39.6   MCV 91.5  PLT 228   Basic Metabolic Panel: Recent Labs  Lab 09/02/22 1315  NA 136  K 3.8  CL 103  CO2 25  GLUCOSE 103*  BUN 15  CREATININE 0.58  CALCIUM 9.2   GFR: Estimated Creatinine Clearance: 52.7 mL/min (by C-G formula based on SCr of 0.58 mg/dL). Liver Function Tests: Recent Labs  Lab 09/02/22 1315  AST 20  ALT 15  ALKPHOS 60  BILITOT 0.5  PROT 6.4*  ALBUMIN 3.5   No results for input(s): "LIPASE", "AMYLASE" in the last 168 hours. No results for input(s): "AMMONIA" in the last 168 hours. Coagulation Profile: No results for input(s): "INR", "PROTIME" in the last 168 hours. Cardiac Enzymes: No results for input(s): "CKTOTAL", "CKMB", "CKMBINDEX", "TROPONINI" in the last 168 hours. BNP (last 3 results) No results for input(s): "PROBNP" in the last 8760 hours. HbA1C: No results for input(s): "HGBA1C" in the last 72 hours. CBG: No results for input(s): "GLUCAP" in the last 168 hours. Lipid Profile: No results for input(s): "CHOL", "HDL", "LDLCALC", "TRIG", "CHOLHDL", "LDLDIRECT" in the last 72 hours. Thyroid Function Tests: No results for input(s): "TSH", "T4TOTAL", "FREET4", "T3FREE", "THYROIDAB" in the last 72 hours. Anemia Panel: No results for input(s): "VITAMINB12", "FOLATE", "FERRITIN", "TIBC", "IRON", "RETICCTPCT" in the last 72 hours. Urine analysis:    Component Value Date/Time   COLORURINE YELLOW 01/09/2022 1215   APPEARANCEUR Clear 03/23/2022 1059   LABSPEC 1.012 01/09/2022 1215   PHURINE 6.0 01/09/2022 1215   GLUCOSEU Negative 03/23/2022 1059   HGBUR SMALL (A) 01/09/2022 1215   BILIRUBINUR Negative 03/23/2022 1059   KETONESUR NEGATIVE 01/09/2022 1215   PROTEINUR Negative 03/23/2022 1059   PROTEINUR NEGATIVE 01/09/2022 1215   NITRITE Negative 03/23/2022 1059   NITRITE NEGATIVE 01/09/2022 1215   LEUKOCYTESUR Trace (A) 03/23/2022 1059   LEUKOCYTESUR NEGATIVE 01/09/2022 1215    Radiological Exams on Admission: I have personally reviewed  images CT ABDOMEN PELVIS W CONTRAST  Result Date: 09/02/2022 CLINICAL DATA:  Rectal bleeding for 5 days. Worsening today and yesterday. EXAM: CT ABDOMEN AND PELVIS WITH CONTRAST TECHNIQUE: Multidetector CT imaging of the abdomen and pelvis was performed using the standard protocol following bolus administration of intravenous contrast. RADIATION DOSE REDUCTION: This exam was performed according to the departmental dose-optimization program which includes automated exposure control, adjustment of the mA and/or kV according to patient size and/or use of iterative reconstruction technique. CONTRAST:  Abdominal MRI 07/04/2004 COMPARISON:  None Available. FINDINGS: Lower chest: The lung bases are clear. The imaged heart is unremarkable. Hepatobiliary: There is a peripherally calcified hypodense lesion in the right hepatic lobe measuring up to 2.9 cm. This is in the location of a large cyst seen on the abdominal MRI from 2006. Findings are consistent with a benign lesion requiring no specific imaging follow-up. The liver is otherwise unremarkable. The gallbladder is unremarkable. There is no biliary ductal dilatation. Pancreas: Unremarkable. Spleen: Unremarkable. Adrenals/Urinary Tract: There is a 1.5 cm left adrenal nodule measuring 55 Hounsfield units on the portal venous phase and 14 Hounsfield units on the delayed phase, most in keeping with a benign adenoma requiring no specific imaging follow-up. The right adrenal is unremarkable. There is a 0.8 cm stone in the proximal right ureter with mild fullness of the collecting system. No other stones are seen in either kidney or along the course of either ureter. There is no left hydronephrosis or hydroureter. There are no suspicious parenchymal lesions. The bladder is unremarkable. Stomach/Bowel: There is a small hiatal hernia. The stomach is otherwise unremarkable. There is no evidence of bowel obstruction. There is no evidence of bowel obstruction. There is no abnormal  bowel wall thickening or inflammatory change. There is extensive colonic diverticulosis without evidence of acute diverticulitis. Note that evaluation for active extravasation into the bowel is limited on this single-phase study. The appendix is not definitively identified. Vascular/Lymphatic: There is extensive calcified plaque  throughout the nonaneurysmal abdominal aorta. The major branch vessels are patent. The main portal and splenic veins are patent. Incidental note is made of a circumaortic left renal vein. There is no abdominopelvic lymphadenopathy. Reproductive: The uterus is surgically absent. There is no adnexal mass. Other: There is no ascites or free air. Musculoskeletal: Postsurgical changes reflecting left hip arthroplasty are noted. There is age-indeterminate compression deformity of the L4 and L5 vertebral bodies. Otherwise, there is no evidence of acute osseous abnormality or suspicious osseous lesion. IMPRESSION: 1. Extensive colonic diverticulosis without evidence of acute diverticulitis. Note that evaluation for active extravasation into the bowel is limited on this single-phase study. 2. 0.8 cm stone in the proximal right ureter with mild fullness of the collecting system. 3. Age-indeterminate compression deformities of the L4 and L5 vertebral bodies. Correlate with point tenderness. Electronically Signed   By: Lesia Hausen M.D.   On: 09/02/2022 15:25    EKG: I have personally reviewed EKG: no EKG done  Assessment/Plan Principal Problem:   PRB (rectal bleeding) Active Problems:   Essential hypertension, benign   COPD, mild (HCC)    Assessment and Plan: * PRB (rectal bleeding) No prior h/o rectal bleeding. No pain, no fever, no abdominal tenderness. CT reveals diverticulosis. Symptoms consistent with diverticular bleed. Dr. Luvenia Starch PA has seen patient. Type and hold sent to blood bank  Plan Observation admit  H/H q8 x 3  GI will follow-up  COPD, mild (HCC) Former smoker. Very  stable with only occasional symptoms for which she uses SABA.  Plan Continue SABA prn  Essential hypertension, benign BP well controlled  Plan Continue home regimen       DVT prophylaxis:  TED hose Code Status: DNR/DNI(Do NOT Intubate)-discussed with patient and her husband Family Communication: husband present during interview and exam  Disposition Plan: home 24 hrs  Consults called: GI- MS. Carlan for Dr. Jena Gauss  Admission status: Observation, Med-Surg   Illene Regulus, MD [facility] 09/02/2022, 9:22 PM

## 2022-09-02 NOTE — ED Triage Notes (Signed)
Pt via POV sent by PCP due to rectal bleeding x 5 days, worse today and yesterday. Pt denies pain. Pt states blood is dark red with clots. Denies diarrhea, constipation, hemorrhoids.

## 2022-09-02 NOTE — Consult Note (Signed)
Gastroenterology Consult   Referring Provider: No ref. provider found Primary Care Physician:  Billie Lade, MD Primary Gastroenterologist:  not established   Patient ID: Alexis Davis; [id number]; 1940-06-09   Admit date: 09/02/2022  LOS: 0 days   Date of Consultation: 09/02/2022  Reason for Consultation:  rectal bleeding   History of Present Illness   Alexis Davis is a 82 y.o. year old female with pmh of COPD, OA, neuropathy, HTN, on daily 325mg  ASA but no other ACs who presented to the ED today with rectal bleeding x3 days, seeing dark red blood with clots. Saw PCP who referred her to the ED for further evaluation. Endorsed previous lightheadedness that resolved after last BM.  FOBT positive Hgb 13 CT A/P with extensive colonic diverticulosis without evidence of acute diverticulitis, limited evaluation for active extravasation due to single phase study  Consult: Patient states that she began having rectal bleeding on Saturday. She notes large appearing volume of blood in the toilet. Typically having a BM twice per day. She notes some constipation that began about 2 weeks ago. She took some stool softeners which seemed to help. She notes more straining to defecate recently though not in the past few days. Denies any rectal pain or abdominal pain. Denies nausea, vomiting, changes in appetite or weight loss. She does not take any NSAIDs besides 325mg  baby ASA. Denies any history of rectal bleeding in the past. She does not think she has ever had a colonoscopy despite colonoscopy listed in her chart from 2015 though no report available. No SOB or dizziness. She notes last episode of rectal bleeding was around lunch time today when she was at PCPs office and had a BM. No rectal bleeding here in the ED.  PA in ED reports nursing staff collected FOBT, no obvious lesions or hemorrhoids noted externally.   Past Medical History:  Diagnosis Date   Arthritis    Shoulder    Compression fracture of L4 lumbar vertebra    Pt did not have pain, osteoporosis   COPD (chronic obstructive pulmonary disease) (HCC) 03/02/2008   PFT   Hepatic cyst    Hypertension    Osteoporosis    Thrombophlebitis of superficial veins of left lower extremity 02/05/2022    Past Surgical History:  Procedure Laterality Date   ABDOMINAL HYSTERECTOMY     CATARACT EXTRACTION W/PHACO Right 08/23/2014   Procedure: CATARACT EXTRACTION PHACO AND INTRAOCULAR LENS PLACEMENT RIGHT EYE; CDE:  10.80;  Surgeon: Gemma Payor, MD;  Location: [facility];  Service: Ophthalmology;  Laterality: Right;   CATARACT EXTRACTION W/PHACO Left 10/08/2014   Procedure: CATARACT EXTRACTION PHACO AND INTRAOCULAR LENS PLACEMENT (IOC);  Surgeon: Gemma Payor, MD;  Location: [facility];  Service: Ophthalmology;  Laterality: Left;  CDE 6.60   TOTAL HIP ARTHROPLASTY Left 01/07/2022   Procedure: LEFT TOTAL HIP ARTHROPLASTY ANTERIOR APPROACH;  Surgeon: Eldred Manges, MD;  Location: [facility];  Service: Orthopedics;  Laterality: Left;    Prior to Admission medications   Medication Sig Start Date End Date Taking? Authorizing Provider  albuterol (VENTOLIN HFA) 108 (90 Base) MCG/ACT inhaler Inhale 2 puffs into the lungs every 4 (four) hours as needed for wheezing or shortness of breath. 09/28/18   [place], Velna Hatchet, MD  amLODipine (NORVASC) 10 MG tablet Take 1 tablet (10 mg total) by mouth daily. 03/23/22   Billie Lade, MD  aspirin EC 325 MG tablet Take 325 mg by mouth daily.    [provider]  Calcium Carbonate-Vitamin D (CALTRATE 600+D PO) Take 1 tablet by mouth 2 (two) times daily.    [provider]  Cholecalciferol (VITAMIN D3) 25 MCG (1000 UT) CAPS Take 1 capsule (1,000 Units total) by mouth daily. 03/14/21   Paseda, Baird Kay, FNP  COD LIVER OIL PO Take 1 capsule by mouth daily.    [provider]  hydrochlorothiazide (HYDRODIURIL) 25 MG tablet Take 1 tablet (25 mg total) by mouth daily. 03/23/22   Billie Lade, MD    Current Facility-Administered Medications  Medication Dose Route Frequency Provider Last Rate Last Admin   pantoprazole (PROTONIX) injection 40 mg  40 mg Intravenous Once Burgess Amor, PA-C       Current Outpatient Medications  Medication Sig Dispense Refill   albuterol (VENTOLIN HFA) 108 (90 Base) MCG/ACT inhaler Inhale 2 puffs into the lungs every 4 (four) hours as needed for wheezing or shortness of breath. 18 g 1   amLODipine (NORVASC) 10 MG tablet Take 1 tablet (10 mg total) by mouth daily. 90 tablet 0   aspirin EC 325 MG tablet Take 325 mg by mouth daily.     Calcium Carbonate-Vitamin D (CALTRATE 600+D PO) Take 1 tablet by mouth 2 (two) times daily.     Cholecalciferol (VITAMIN D3) 25 MCG (1000 UT) CAPS Take 1 capsule (1,000 Units total) by mouth daily. 60 capsule 3   COD LIVER OIL PO Take 1 capsule by mouth daily.     hydrochlorothiazide (HYDRODIURIL) 25 MG tablet Take 1 tablet (25 mg total) by mouth daily. 90 tablet 2    Allergies as of 09/02/2022   (No Known Allergies)    Family History  Problem Relation Age of Onset   Diabetes Mother    Heart disease Father    Hypertension Sister    Hypertension Sister    Hypertension Sister    Hypertension Brother    Diabetes Mellitus II Son    Arthritis Other    Breast cancer Neg Hx    Colon cancer Neg Hx    Cancer - Lung Neg Hx     Social History   Socioeconomic History   Marital status: Married    Spouse name: Jomarie Longs   Number of children: 3   Years of education: 12   Highest education level: Not on file  Occupational History   Not on file  Tobacco Use   Smoking status: Former    Types: Cigarettes   Smokeless tobacco: Never   Tobacco comments:    She quit smoking in January 2022. Smoked for 20 years, a pack usually last her a week.  Vaping Use   Vaping Use: Never used  Substance and Sexual Activity   Alcohol use: No   Drug use: No   Sexual activity: Yes  Other Topics Concern   Not on file   Social History Narrative   Lives at home with her husband, has 3 children, pt is retired.   Social Determinants of Health   Financial Resource Strain: Low Risk  (07/18/2019)   Overall Financial Resource Strain (CARDIA)    Difficulty of Paying Living Expenses: Not very hard  Food Insecurity: No Food Insecurity (01/07/2022)   Hunger Vital Sign    Worried About Running Out of Food in the Last Year: Never true    Ran Out of Food in the Last Year: Never true  Transportation Needs: No Transportation Needs (01/07/2022)   PRAPARE - Administrator, Civil Service (Medical): No  Lack of Transportation (Non-Medical): No  Physical Activity: Not on file  Stress: Not on file  Social Connections: Not on file  Intimate Partner Violence: Not At Risk (01/07/2022)   Humiliation, Afraid, Rape, and Kick questionnaire    Fear of Current or Ex-Partner: No    Emotionally Abused: No    Physically Abused: No    Sexually Abused: No     Review of Systems   Gen: Denies any fever, chills, loss of appetite, change in weight or weight loss CV: Denies chest pain, heart palpitations, syncope, edema  Resp: Denies shortness of breath with rest, cough, wheezing, coughing up blood, and pleurisy. GI: +rectal bleeding  GU : Denies urinary burning, blood in urine, urinary frequency, and urinary incontinence. MS: Denies joint pain, limitation of movement, swelling, cramps, and atrophy.  Derm: Denies rash, itching, dry skin, hives. Psych: Denies depression, anxiety, memory loss, hallucinations, and confusion. Heme: Denies bruising or bleeding Neuro:  Denies any headaches, dizziness, paresthesias, shaking  Physical Exam   Vital Signs in last 24 hours: Temp:  [97.7 F (36.5 C)] 97.7 F (36.5 C) (07/03 1232) Pulse Rate:  [73] 73 (07/03 1232) Resp:  [18] 18 (07/03 1232) BP: (116)/(76) 116/76 (07/03 1232) SpO2:  [98 %] 98 % (07/03 1232) Weight:  [74.4 kg] 74.4 kg (07/03 1229)   General:   Alert,   Well-developed, well-nourished, pleasant and cooperative in NAD Head:  Normocephalic and atraumatic. Eyes:  Sclera clear, no icterus.   Conjunctiva pink. Lungs:  Clear throughout to auscultation.   No wheezes, crackles, or rhonchi. No acute distress. Heart:  Regular rate and rhythm; no murmurs, clicks, rubs,  or gallops. Abdomen:  Soft, nontender and nondistended. No masses, hepatosplenomegaly or hernias noted. Normal bowel sounds, without guarding, and without rebound.   Rectal: ED Tech present as witness. No obvious lesions or masses, DRE tolerated well by patient. No internal masses noted. Sphincter tone WNL. No frank blood noted on exam.   Msk:  Symmetrical without gross deformities. Normal posture. Extremities:  Without clubbing or edema. Neurologic:  Alert and  oriented x4. Skin:  Intact without significant lesions or rashes. Psych:  Alert and cooperative. Normal mood and affect.   Labs/Studies   Recent Labs Recent Labs    09/02/22 1315  WBC 9.3  HGB 13.0  HCT 39.6  PLT 228   BMET Recent Labs    09/02/22 1315  NA 136  K 3.8  CL 103  CO2 25  GLUCOSE 103*  BUN 15  CREATININE 0.58  CALCIUM 9.2   LFT Recent Labs    09/02/22 1315  PROT 6.4*  ALBUMIN 3.5  AST 20  ALT 15  ALKPHOS 60  BILITOT 0.5    Radiology/Studies CT ABDOMEN PELVIS W CONTRAST  Result Date: 09/02/2022 CLINICAL DATA:  Rectal bleeding for 5 days. Worsening today and yesterday. EXAM: CT ABDOMEN AND PELVIS WITH CONTRAST TECHNIQUE: Multidetector CT imaging of the abdomen and pelvis was performed using the standard protocol following bolus administration of intravenous contrast. RADIATION DOSE REDUCTION: This exam was performed according to the departmental dose-optimization program which includes automated exposure control, adjustment of the mA and/or kV according to patient size and/or use of iterative reconstruction technique. CONTRAST:  Abdominal MRI 07/04/2004 COMPARISON:  None Available. FINDINGS:  Lower chest: The lung bases are clear. The imaged heart is unremarkable. Hepatobiliary: There is a peripherally calcified hypodense lesion in the right hepatic lobe measuring up to 2.9 cm. This is in the location of a large  cyst seen on the abdominal MRI from 2006. Findings are consistent with a benign lesion requiring no specific imaging follow-up. The liver is otherwise unremarkable. The gallbladder is unremarkable. There is no biliary ductal dilatation. Pancreas: Unremarkable. Spleen: Unremarkable. Adrenals/Urinary Tract: There is a 1.5 cm left adrenal nodule measuring 55 Hounsfield units on the portal venous phase and 14 Hounsfield units on the delayed phase, most in keeping with a benign adenoma requiring no specific imaging follow-up. The right adrenal is unremarkable. There is a 0.8 cm stone in the proximal right ureter with mild fullness of the collecting system. No other stones are seen in either kidney or along the course of either ureter. There is no left hydronephrosis or hydroureter. There are no suspicious parenchymal lesions. The bladder is unremarkable. Stomach/Bowel: There is a small hiatal hernia. The stomach is otherwise unremarkable. There is no evidence of bowel obstruction. There is no evidence of bowel obstruction. There is no abnormal bowel wall thickening or inflammatory change. There is extensive colonic diverticulosis without evidence of acute diverticulitis. Note that evaluation for active extravasation into the bowel is limited on this single-phase study. The appendix is not definitively identified. Vascular/Lymphatic: There is extensive calcified plaque throughout the nonaneurysmal abdominal aorta. The major branch vessels are patent. The main portal and splenic veins are patent. Incidental note is made of a circumaortic left renal vein. There is no abdominopelvic lymphadenopathy. Reproductive: The uterus is surgically absent. There is no adnexal mass. Other: There is no ascites or free  air. Musculoskeletal: Postsurgical changes reflecting left hip arthroplasty are noted. There is age-indeterminate compression deformity of the L4 and L5 vertebral bodies. Otherwise, there is no evidence of acute osseous abnormality or suspicious osseous lesion. IMPRESSION: 1. Extensive colonic diverticulosis without evidence of acute diverticulitis. Note that evaluation for active extravasation into the bowel is limited on this single-phase study. 2. 0.8 cm stone in the proximal right ureter with mild fullness of the collecting system. 3. Age-indeterminate compression deformities of the L4 and L5 vertebral bodies. Correlate with point tenderness. Electronically Signed   By: Lesia Hausen M.D.   On: 09/02/2022 15:25     Assessment   CHANTASIA BROGE is a 82 y.o. year old female who presented to the ED today with rectal bleeding x3 days, seeing dark red blood with clots. FOBT positive, hemoglobin stable. GI consulted for further evaluation.  Rectal bleeding: painless, x3 days. Hemoglobin stable at 13. No ACs. CT A/P with extensive diverticulosis, no diverticulitis. Denies any associated symptoms. Constipation and straining over the past few weeks but none the past few days. No other otc NSAIDs besides 34m ASA. Denies previous colonoscopy. No changes in appetite or weight loss. Differentials at this time include diverticular bleed, AMVs, bleeding polyps, internal hemorrhoids, malignancy. For now will hold 325mg  ASA, plan for observation overnight, monitor for overt GI bleeding and trend hemoglobin. If bleeding tapers off and hemoglobin remains stable, may be able to discharge tomorrow with outpatient GI follow up/colonoscopy. If she were to have recurrent bleeding/drop in hemoglobin, may consider inpatient colonoscopy.    Plan / Recommendations   PPI daily Monitor for overt GI bleeding Trend h&h, transfuse hgb<7 Consider outpatient colonoscopy/inpatient if bleeding persists/hemoglobin declines  5.  Hold 325mg  aspirin for now    Cyris Maalouf L. Jeanmarie Hubert, MSN, APRN, AGNP-C Adult-Gerontology Nurse Practitioner [facility] at [facility]

## 2022-09-02 NOTE — Assessment & Plan Note (Deleted)
No prior h/o rectal bleeding. No pain, no fever, no abdominal tenderness. CT reveals diverticulosis. Symptoms consistent with diverticular bleed. Dr. Luvenia Starch PA has seen patient. Type and hold sent to blood bank  Plan Observation admit  H/H q8 x 3  GI will follow-up

## 2022-09-02 NOTE — H&P (View-Only) (Signed)
 Gastroenterology Consult   Referring Provider: No ref. provider found Primary Care Physician:  Dixon, Phillip E, MD Primary Gastroenterologist:  not established   Patient ID: Alexis Davis; [id number]; 08/16/1940   Admit date: 09/02/2022  LOS: 0 days   Date of Consultation: 09/02/2022  Reason for Consultation:  rectal bleeding   History of Present Illness   Alexis Davis is a 82 y.o. year old female with pmh of COPD, OA, neuropathy, HTN, on daily 325mg ASA but no other ACs who presented to the ED today with rectal bleeding x3 days, seeing dark red blood with clots. Saw PCP who referred her to the ED for further evaluation. Endorsed previous lightheadedness that resolved after last BM.  FOBT positive Hgb 13 CT A/P with extensive colonic diverticulosis without evidence of acute diverticulitis, limited evaluation for active extravasation due to single phase study  Consult: Patient states that she began having rectal bleeding on Saturday. She notes large appearing volume of blood in the toilet. Typically having a BM twice per day. She notes some constipation that began about 2 weeks ago. She took some stool softeners which seemed to help. She notes more straining to defecate recently though not in the past few days. Denies any rectal pain or abdominal pain. Denies nausea, vomiting, changes in appetite or weight loss. She does not take any NSAIDs besides 325mg baby ASA. Denies any history of rectal bleeding in the past. She does not think she has ever had a colonoscopy despite colonoscopy listed in her chart from 2015 though no report available. No SOB or dizziness. She notes last episode of rectal bleeding was around lunch time today when she was at PCPs office and had a BM. No rectal bleeding here in the ED.  PA in ED reports nursing staff collected FOBT, no obvious lesions or hemorrhoids noted externally.   Past Medical History:  Diagnosis Date   Arthritis    Shoulder    Compression fracture of L4 lumbar vertebra    Pt did not have pain, osteoporosis   COPD (chronic obstructive pulmonary disease) (HCC) 03/02/2008   PFT   Hepatic cyst    Hypertension    Osteoporosis    Thrombophlebitis of superficial veins of left lower extremity 02/05/2022    Past Surgical History:  Procedure Laterality Date   ABDOMINAL HYSTERECTOMY     CATARACT EXTRACTION W/PHACO Right 08/23/2014   Procedure: CATARACT EXTRACTION PHACO AND INTRAOCULAR LENS PLACEMENT RIGHT EYE; CDE:  10.80;  Surgeon: Kerry Hunt, MD;  Location: [facility];  Service: Ophthalmology;  Laterality: Right;   CATARACT EXTRACTION W/PHACO Left 10/08/2014   Procedure: CATARACT EXTRACTION PHACO AND INTRAOCULAR LENS PLACEMENT (IOC);  Surgeon: Kerry Hunt, MD;  Location: [facility];  Service: Ophthalmology;  Laterality: Left;  CDE 6.60   TOTAL HIP ARTHROPLASTY Left 01/07/2022   Procedure: LEFT TOTAL HIP ARTHROPLASTY ANTERIOR APPROACH;  Surgeon: Yates, Mark C, MD;  Location: [facility];  Service: Orthopedics;  Laterality: Left;    Prior to Admission medications   Medication Sig Start Date End Date Taking? Authorizing Provider  albuterol (VENTOLIN HFA) 108 (90 Base) MCG/ACT inhaler Inhale 2 puffs into the lungs every 4 (four) hours as needed for wheezing or shortness of breath. 09/28/18   , Kawanta F, MD  amLODipine (NORVASC) 10 MG tablet Take 1 tablet (10 mg total) by mouth daily. 03/23/22   Dixon, Phillip E, MD  aspirin EC 325 MG tablet Take 325 mg by mouth daily.    [provider]    Calcium Carbonate-Vitamin D (CALTRATE 600+D PO) Take 1 tablet by mouth 2 (two) times daily.    [provider]  Cholecalciferol (VITAMIN D3) 25 MCG (1000 UT) CAPS Take 1 capsule (1,000 Units total) by mouth daily. 03/14/21   Paseda, Folashade R, FNP  COD LIVER OIL PO Take 1 capsule by mouth daily.    [provider]  hydrochlorothiazide (HYDRODIURIL) 25 MG tablet Take 1 tablet (25 mg total) by mouth daily. 03/23/22   Dixon,  Phillip E, MD    Current Facility-Administered Medications  Medication Dose Route Frequency Provider Last Rate Last Admin   pantoprazole (PROTONIX) injection 40 mg  40 mg Intravenous Once Idol, Julie, PA-C       Current Outpatient Medications  Medication Sig Dispense Refill   albuterol (VENTOLIN HFA) 108 (90 Base) MCG/ACT inhaler Inhale 2 puffs into the lungs every 4 (four) hours as needed for wheezing or shortness of breath. 18 g 1   amLODipine (NORVASC) 10 MG tablet Take 1 tablet (10 mg total) by mouth daily. 90 tablet 0   aspirin EC 325 MG tablet Take 325 mg by mouth daily.     Calcium Carbonate-Vitamin D (CALTRATE 600+D PO) Take 1 tablet by mouth 2 (two) times daily.     Cholecalciferol (VITAMIN D3) 25 MCG (1000 UT) CAPS Take 1 capsule (1,000 Units total) by mouth daily. 60 capsule 3   COD LIVER OIL PO Take 1 capsule by mouth daily.     hydrochlorothiazide (HYDRODIURIL) 25 MG tablet Take 1 tablet (25 mg total) by mouth daily. 90 tablet 2    Allergies as of 09/02/2022   (No Known Allergies)    Family History  Problem Relation Age of Onset   Diabetes Mother    Heart disease Father    Hypertension Sister    Hypertension Sister    Hypertension Sister    Hypertension Brother    Diabetes Mellitus II Son    Arthritis Other    Breast cancer Neg Hx    Colon cancer Neg Hx    Cancer - Lung Neg Hx     Social History   Socioeconomic History   Marital status: Married    Spouse name: Joseph   Number of children: 3   Years of education: 12   Highest education level: Not on file  Occupational History   Not on file  Tobacco Use   Smoking status: Former    Types: Cigarettes   Smokeless tobacco: Never   Tobacco comments:    She quit smoking in January 2022. Smoked for 20 years, a pack usually last her a week.  Vaping Use   Vaping Use: Never used  Substance and Sexual Activity   Alcohol use: No   Drug use: No   Sexual activity: Yes  Other Topics Concern   Not on file   Social History Narrative   Lives at home with her husband, has 3 children, pt is retired.   Social Determinants of Health   Financial Resource Strain: Low Risk  (07/18/2019)   Overall Financial Resource Strain (CARDIA)    Difficulty of Paying Living Expenses: Not very hard  Food Insecurity: No Food Insecurity (01/07/2022)   Hunger Vital Sign    Worried About Running Out of Food in the Last Year: Never true    Ran Out of Food in the Last Year: Never true  Transportation Needs: No Transportation Needs (01/07/2022)   PRAPARE - Transportation    Lack of Transportation (Medical): No      Lack of Transportation (Non-Medical): No  Physical Activity: Not on file  Stress: Not on file  Social Connections: Not on file  Intimate Partner Violence: Not At Risk (01/07/2022)   Humiliation, Afraid, Rape, and Kick questionnaire    Fear of Current or Ex-Partner: No    Emotionally Abused: No    Physically Abused: No    Sexually Abused: No     Review of Systems   Gen: Denies any fever, chills, loss of appetite, change in weight or weight loss CV: Denies chest pain, heart palpitations, syncope, edema  Resp: Denies shortness of breath with rest, cough, wheezing, coughing up blood, and pleurisy. GI: +rectal bleeding  GU : Denies urinary burning, blood in urine, urinary frequency, and urinary incontinence. MS: Denies joint pain, limitation of movement, swelling, cramps, and atrophy.  Derm: Denies rash, itching, dry skin, hives. Psych: Denies depression, anxiety, memory loss, hallucinations, and confusion. Heme: Denies bruising or bleeding Neuro:  Denies any headaches, dizziness, paresthesias, shaking  Physical Exam   Vital Signs in last 24 hours: Temp:  [97.7 F (36.5 C)] 97.7 F (36.5 C) (07/03 1232) Pulse Rate:  [73] 73 (07/03 1232) Resp:  [18] 18 (07/03 1232) BP: (116)/(76) 116/76 (07/03 1232) SpO2:  [98 %] 98 % (07/03 1232) Weight:  [74.4 kg] 74.4 kg (07/03 1229)   General:   Alert,   Well-developed, well-nourished, pleasant and cooperative in NAD Head:  Normocephalic and atraumatic. Eyes:  Sclera clear, no icterus.   Conjunctiva pink. Lungs:  Clear throughout to auscultation.   No wheezes, crackles, or rhonchi. No acute distress. Heart:  Regular rate and rhythm; no murmurs, clicks, rubs,  or gallops. Abdomen:  Soft, nontender and nondistended. No masses, hepatosplenomegaly or hernias noted. Normal bowel sounds, without guarding, and without rebound.   Rectal: ED Tech present as witness. No obvious lesions or masses, DRE tolerated well by patient. No internal masses noted. Sphincter tone WNL. No frank blood noted on exam.   Msk:  Symmetrical without gross deformities. Normal posture. Extremities:  Without clubbing or edema. Neurologic:  Alert and  oriented x4. Skin:  Intact without significant lesions or rashes. Psych:  Alert and cooperative. Normal mood and affect.   Labs/Studies   Recent Labs Recent Labs    09/02/22 1315  WBC 9.3  HGB 13.0  HCT 39.6  PLT 228   BMET Recent Labs    09/02/22 1315  NA 136  K 3.8  CL 103  CO2 25  GLUCOSE 103*  BUN 15  CREATININE 0.58  CALCIUM 9.2   LFT Recent Labs    09/02/22 1315  PROT 6.4*  ALBUMIN 3.5  AST 20  ALT 15  ALKPHOS 60  BILITOT 0.5    Radiology/Studies CT ABDOMEN PELVIS W CONTRAST  Result Date: 09/02/2022 CLINICAL DATA:  Rectal bleeding for 5 days. Worsening today and yesterday. EXAM: CT ABDOMEN AND PELVIS WITH CONTRAST TECHNIQUE: Multidetector CT imaging of the abdomen and pelvis was performed using the standard protocol following bolus administration of intravenous contrast. RADIATION DOSE REDUCTION: This exam was performed according to the departmental dose-optimization program which includes automated exposure control, adjustment of the mA and/or kV according to patient size and/or use of iterative reconstruction technique. CONTRAST:  Abdominal MRI 07/04/2004 COMPARISON:  None Available. FINDINGS:  Lower chest: The lung bases are clear. The imaged heart is unremarkable. Hepatobiliary: There is a peripherally calcified hypodense lesion in the right hepatic lobe measuring up to 2.9 cm. This is in the location of a large   cyst seen on the abdominal MRI from 2006. Findings are consistent with a benign lesion requiring no specific imaging follow-up. The liver is otherwise unremarkable. The gallbladder is unremarkable. There is no biliary ductal dilatation. Pancreas: Unremarkable. Spleen: Unremarkable. Adrenals/Urinary Tract: There is a 1.5 cm left adrenal nodule measuring 55 Hounsfield units on the portal venous phase and 14 Hounsfield units on the delayed phase, most in keeping with a benign adenoma requiring no specific imaging follow-up. The right adrenal is unremarkable. There is a 0.8 cm stone in the proximal right ureter with mild fullness of the collecting system. No other stones are seen in either kidney or along the course of either ureter. There is no left hydronephrosis or hydroureter. There are no suspicious parenchymal lesions. The bladder is unremarkable. Stomach/Bowel: There is a small hiatal hernia. The stomach is otherwise unremarkable. There is no evidence of bowel obstruction. There is no evidence of bowel obstruction. There is no abnormal bowel wall thickening or inflammatory change. There is extensive colonic diverticulosis without evidence of acute diverticulitis. Note that evaluation for active extravasation into the bowel is limited on this single-phase study. The appendix is not definitively identified. Vascular/Lymphatic: There is extensive calcified plaque throughout the nonaneurysmal abdominal aorta. The major branch vessels are patent. The main portal and splenic veins are patent. Incidental note is made of a circumaortic left renal vein. There is no abdominopelvic lymphadenopathy. Reproductive: The uterus is surgically absent. There is no adnexal mass. Other: There is no ascites or free  air. Musculoskeletal: Postsurgical changes reflecting left hip arthroplasty are noted. There is age-indeterminate compression deformity of the L4 and L5 vertebral bodies. Otherwise, there is no evidence of acute osseous abnormality or suspicious osseous lesion. IMPRESSION: 1. Extensive colonic diverticulosis without evidence of acute diverticulitis. Note that evaluation for active extravasation into the bowel is limited on this single-phase study. 2. 0.8 cm stone in the proximal right ureter with mild fullness of the collecting system. 3. Age-indeterminate compression deformities of the L4 and L5 vertebral bodies. Correlate with point tenderness. Electronically Signed   By: Peter  Noone M.D.   On: 09/02/2022 15:25     Assessment   Alexis Davis is a 82 y.o. year old female who presented to the ED today with rectal bleeding x3 days, seeing dark red blood with clots. FOBT positive, hemoglobin stable. GI consulted for further evaluation.  Rectal bleeding: painless, x3 days. Hemoglobin stable at 13. No ACs. CT A/P with extensive diverticulosis, no diverticulitis. Denies any associated symptoms. Constipation and straining over the past few weeks but none the past few days. No other otc NSAIDs besides 325m ASA. Denies previous colonoscopy. No changes in appetite or weight loss. Differentials at this time include diverticular bleed, AMVs, bleeding polyps, internal hemorrhoids, malignancy. For now will hold 325mg ASA, plan for observation overnight, monitor for overt GI bleeding and trend hemoglobin. If bleeding tapers off and hemoglobin remains stable, may be able to discharge tomorrow with outpatient GI follow up/colonoscopy. If she were to have recurrent bleeding/drop in hemoglobin, may consider inpatient colonoscopy.    Plan / Recommendations   PPI daily Monitor for overt GI bleeding Trend h&h, transfuse hgb<7 Consider outpatient colonoscopy/inpatient if bleeding persists/hemoglobin declines  5.  Hold 325mg aspirin for now    Raeford Brandenburg L. Johndaniel Catlin, MSN, APRN, AGNP-C Adult-Gerontology Nurse Practitioner [facility] [place]   

## 2022-09-02 NOTE — Assessment & Plan Note (Addendum)
Continue home antihypertensive agents -Heart healthy diet discussed with patient.

## 2022-09-02 NOTE — Subjective & Objective (Signed)
Alexis Davis, a very pleasant 82 y/o with OA, peripheral polyneuropathy and no GI history started having blood per rectum 48 hrs prior to admission. She has had no pain or discomfort, no fevers, no change in bowel habit prior to the onset of symptoms, no weight loss. She went to her PCP, Dr. Edilia Bo, and had a blood stool there and was subsequently referred to [facility] for evaluation.

## 2022-09-02 NOTE — Assessment & Plan Note (Addendum)
No signs of exacerbation, continue with as needed albuterol.

## 2022-09-03 DIAGNOSIS — K625 Hemorrhage of anus and rectum: Secondary | ICD-10-CM | POA: Diagnosis not present

## 2022-09-03 LAB — HEMOGLOBIN AND HEMATOCRIT, BLOOD
HCT: 37 % (ref 36.0–46.0)
HCT: 37.5 % (ref 36.0–46.0)
HCT: 37.9 % (ref 36.0–46.0)
Hemoglobin: 12.1 g/dL (ref 12.0–15.0)
Hemoglobin: 12.5 g/dL (ref 12.0–15.0)
Hemoglobin: 12.6 g/dL (ref 12.0–15.0)

## 2022-09-03 MED ORDER — PEG 3350-KCL-NA BICARB-NACL 420 G PO SOLR
4000.0000 mL | Freq: Once | ORAL | Status: AC
  Administered 2022-09-03: 4000 mL via ORAL

## 2022-09-03 NOTE — TOC CM/SW Note (Signed)
Transition of Care [facility]) - Inpatient Brief Assessment   Patient Details  Name: Alexis Davis MRN: [id number] Date of Birth: 06-15-1940  Transition of Care [facility]) CM/SW Contact:    Elliot Gault, LCSW Phone Number: 09/03/2022, 11:19 AM   Clinical Narrative:  Transition of Care Department [facility]) has reviewed patient and no TOC needs have been identified at this time. We will continue to monitor patient advancement through interdisciplinary progression rounds. If new patient transition needs arise, please place a TOC consult.  Transition of Care Asessment: Insurance and Status: Insurance coverage has been reviewed Patient has primary care physician: Yes Home environment has been reviewed: from home with spouse Prior level of function:: independent Prior/Current Home Services: No current home services Social Determinants of Health Reivew: SDOH reviewed no interventions necessary Readmission risk has been reviewed: Yes Transition of care needs: no transition of care needs at this time

## 2022-09-03 NOTE — Progress Notes (Signed)
Patient stable overnight.  Denies abdominal pain 1 small bloody bowel movement this morning.  Hemoglobin remains normal at 12.5.  She is adamant that she has never had a colonoscopy.  Vital signs in last 24 hours: Temp:  [97.5 F (36.4 C)-98.6 F (37 C)] 98.3 F (36.8 C) (07/04 0427) Pulse Rate:  [52-77] 73 (07/04 0601) Resp:  [16-18] 18 (07/04 0427) BP: (93-146)/(56-76) 99/60 (07/04 0601) SpO2:  [94 %-100 %] 98 % (07/04 0427) Weight:  [73.5 kg-74.4 kg] 73.5 kg (07/03 2137) Last BM Date : 09/02/22 General:   Alert,   well-nourished, pleasant and cooperative in NAD Abdomen:  Soft, nontender and nondistended.  Normal bowel sounds, without guarding, and without rebound.  No mass or organomegaly.   Intake/Output from previous day: No intake/output data recorded. Intake/Output this shift: No intake/output data recorded.  Lab Results: Recent Labs    09/02/22 1315 09/02/22 2358 09/03/22 0805  WBC 9.3  --   --   HGB 13.0 12.1 12.5  HCT 39.6 37.0 37.5  PLT 228  --   --    BMET Recent Labs    09/02/22 1315  NA 136  K 3.8  CL 103  CO2 25  GLUCOSE 103*  BUN 15  CREATININE 0.58  CALCIUM 9.2   LFT Recent Labs    09/02/22 1315  PROT 6.4*  ALBUMIN 3.5  AST 20  ALT 15  ALKPHOS 60  BILITOT 0.5   PT/INR No results for input(s): "LABPROT", "INR" in the last 72 hours. Hepatitis Panel No results for input(s): "HEPBSAG", "HCVAB", "HEPAIGM", "HEPBIGM" in the last 72 hours. C-Diff No results for input(s): "CDIFFTOX" in the last 72 hours.  Studies/Results: CT ABDOMEN PELVIS W CONTRAST  Result Date: 09/02/2022 CLINICAL DATA:  Rectal bleeding for 5 days. Worsening today and yesterday. EXAM: CT ABDOMEN AND PELVIS WITH CONTRAST TECHNIQUE: Multidetector CT imaging of the abdomen and pelvis was performed using the standard protocol following bolus administration of intravenous contrast. RADIATION DOSE REDUCTION: This exam was performed according to the departmental  dose-optimization program which includes automated exposure control, adjustment of the mA and/or kV according to patient size and/or use of iterative reconstruction technique. CONTRAST:  Abdominal MRI 07/04/2004 COMPARISON:  None Available. FINDINGS: Lower chest: The lung bases are clear. The imaged heart is unremarkable. Hepatobiliary: There is a peripherally calcified hypodense lesion in the right hepatic lobe measuring up to 2.9 cm. This is in the location of a large cyst seen on the abdominal MRI from 2006. Findings are consistent with a benign lesion requiring no specific imaging follow-up. The liver is otherwise unremarkable. The gallbladder is unremarkable. There is no biliary ductal dilatation. Pancreas: Unremarkable. Spleen: Unremarkable. Adrenals/Urinary Tract: There is a 1.5 cm left adrenal nodule measuring 55 Hounsfield units on the portal venous phase and 14 Hounsfield units on the delayed phase, most in keeping with a benign adenoma requiring no specific imaging follow-up. The right adrenal is unremarkable. There is a 0.8 cm stone in the proximal right ureter with mild fullness of the collecting system. No other stones are seen in either kidney or along the course of either ureter. There is no left hydronephrosis or hydroureter. There are no suspicious parenchymal lesions. The bladder is unremarkable. Stomach/Bowel: There is a small hiatal hernia. The stomach is otherwise unremarkable. There is no evidence of bowel obstruction. There is no evidence of bowel obstruction. There is no abnormal bowel wall thickening or inflammatory change. There is extensive colonic diverticulosis without evidence of acute diverticulitis.  Note that evaluation for active extravasation into the bowel is limited on this single-phase study. The appendix is not definitively identified. Vascular/Lymphatic: There is extensive calcified plaque throughout the nonaneurysmal abdominal aorta. The major branch vessels are patent. The  main portal and splenic veins are patent. Incidental note is made of a circumaortic left renal vein. There is no abdominopelvic lymphadenopathy. Reproductive: The uterus is surgically absent. There is no adnexal mass. Other: There is no ascites or free air. Musculoskeletal: Postsurgical changes reflecting left hip arthroplasty are noted. There is age-indeterminate compression deformity of the L4 and L5 vertebral bodies. Otherwise, there is no evidence of acute osseous abnormality or suspicious osseous lesion. IMPRESSION: 1. Extensive colonic diverticulosis without evidence of acute diverticulitis. Note that evaluation for active extravasation into the bowel is limited on this single-phase study. 2. 0.8 cm stone in the proximal right ureter with mild fullness of the collecting system. 3. Age-indeterminate compression deformities of the L4 and L5 vertebral bodies. Correlate with point tenderness. Electronically Signed   By: Lesia Hausen M.D.   On: 09/02/2022 15:25    Impression: Pleasant elderly 82 year old lady admitted with painless rectal bleeding.  She was admitted overnight for observation.  She has extensive diverticulosis on CT.  Site of bleeding not identified.  Hemoglobin remains in the normal range.  I suspect a rather trivial lower GI bleed likely diverticula in origin. However,  other possibility including hemorrhoids, neoplasia, etc. not excluded.  I recommended to this nice lady she have a colonoscopy.  This can be done before she goes home or could be done in close interval outpatient follow-up.  I have recommended she continue to hold her 325 mg aspirin until GI evaluation is been completed.  She has opted to go ahead and get her colonoscopy while she is here just to get it done and get to the bottom of the bleeding.  This is certainly not unreasonable.   Recommendations:  Will plan for colonoscopy tomorrow.  The risk, benefits, limitations have been reviewed.  All of her questions have been  answered she is agreeable.  Will change her diet to clear liquids.  Start prep this afternoon.  Further recommendations to follow.

## 2022-09-03 NOTE — Plan of Care (Signed)
  Problem: Education: Goal: Knowledge of General Education information will improve Description Including pain rating scale, medication(s)/side effects and non-pharmacologic comfort measures Outcome: Progressing   

## 2022-09-03 NOTE — Progress Notes (Signed)
BP soft this a.m., other vitals stable. MD notified. No c/o pain noted.

## 2022-09-03 NOTE — Progress Notes (Signed)
PROGRESS NOTE Alexis Davis  YNW:[id number] DOB: February 03, 1941 DOA: 09/02/2022 PCP: Billie Lade, MD  Brief Narrative/Hospital Course: 82 y/o with OA, peripheral polyneuropathy and no GI history started having blood per rectum 48 hrs prior to admission. She has had no pain or discomfort, no fevers, no change in bowel habit prior to the onset of symptoms, no weight loss. She went to her PCP, Dr. Edilia Bo, and had a blood stool there and was subsequently referred to [facility] for evaluation.   In ED:T 97.6  145/75  HR 63 RR 18. Gen'l - WNWD woman in no distress. Patient with Hgb 13.0, Cmet nl. CT reveals diverticulosis. Ms Jeanmarie Hubert, AGNP-C for Dr. Jena Gauss has seen patient and admitted under [facility] for further management  Subjective: Seen and examined this morning Reports still having bleeding this morning but appears slightly No bowel movement overnight Overnight BP slightly soft lab work from last night stable hb.  Assessment and Plan: Principal Problem:   PRB (rectal bleeding) Active Problems:   Essential hypertension, benign   COPD, mild (HCC)   Rectal bleeding: Painless bleeding x 3 days, hemoglobin is stable CT abdomen pelvis with extensive diverticulosis no diverticulitis likely diverticular bleeding.  Avoid NSAIDs anticoagulant.  No previous colonoscopy history.  Recheck H&H appears stable continue to monitor, GI following closely-noted plan for colonoscopy tomorrow  COPD, mild: Former smoker. Very stable with only occasional symptoms for which she uses SABA.  Essential hypertension, benign BP soft. Hold amlodipine/HCTZ  DVT prophylaxis: Place TED hose Start: 09/02/22 2056 Code Status:   Code Status: DNR Family Communication: plan of care discussed with patient at bedside.  Patient status is:  admitted as observation but remains hospitalized for ongoing  because of rectal bleeding Level of care: Med-Surg   Dispo: The patient is from: home            Anticipated disposition: home  1-2 days Objective: Vitals last 24 hrs: Vitals:   09/02/22 2137 09/03/22 0203 09/03/22 0427 09/03/22 0601  BP: (!) 146/58 (!) 110/59 (!) 93/56 99/60  Pulse: (!) 58 75 77 73  Resp: 18 16 18    Temp: (!) 97.5 F (36.4 C) 98.6 F (37 C) 98.3 F (36.8 C)   TempSrc: Oral Oral Oral   SpO2: 100% 94% 98%   Weight: 73.5 kg     Height:       Weight change:   Physical Examination: General exam: alert awake, older than stated age HEENT:Oral mucosa moist, Ear/Nose WNL grossly Respiratory system: bilaterally c;ear BS, no use of accessory muscle Cardiovascular system: S1 & S2 +, No JVD. Gastrointestinal system: Abdomen soft,NT,ND, BS+ Nervous System:Alert, awake, moving extremities. Extremities: LE edema neg,distal peripheral pulses palpable.  Skin: No rashes,no icterus. MSK: Normal muscle bulk,tone, power  Medications reviewed:  Scheduled Meds:  amLODipine  10 mg Oral Daily   hydrochlorothiazide  25 mg Oral Daily   pantoprazole (PROTONIX) IV  40 mg Intravenous Q24H   Continuous Infusions:    Diet Order             Diet regular Room service appropriate? Yes; Fluid consistency: Thin  Diet effective now                           No intake or output data in the 24 hours ending 09/03/22 0748 Net IO Since Admission: No IO data has been entered for this period [09/03/22 0748]  Wt Readings from Last 3 Encounters:  09/02/22 73.5  kg  06/23/22 75 kg  05/29/22 76.3 kg     Unresulted Labs (From admission, onward)     Start     Ordered   09/02/22 2359  Hemoglobin and hematocrit, blood  Now then every 8 hours,   R (with TIMED occurrences)      09/02/22 2057          Data Reviewed: I have personally reviewed following labs and imaging studies CBC: Recent Labs  Lab 09/02/22 1315 09/02/22 2358  WBC 9.3  --   HGB 13.0 12.1  HCT 39.6 37.0  MCV 91.5  --   PLT 228  --    Basic Metabolic Panel: Recent Labs  Lab 09/02/22 1315  NA 136  K 3.8  CL 103  CO2 25   GLUCOSE 103*  BUN 15  CREATININE 0.58  CALCIUM 9.2   GFR: Estimated Creatinine Clearance: 52.3 mL/min (by C-G formula based on SCr of 0.58 mg/dL). Liver Function Tests: Recent Labs  Lab 09/02/22 1315  AST 20  ALT 15  ALKPHOS 60  BILITOT 0.5  PROT 6.4*  ALBUMIN 3.5    No results found for this or any previous visit (from the past 240 hour(s)).  Antimicrobials: Anti-infectives (From admission, onward)    None      Culture/Microbiology No results found for: "SDES", "SPECREQUEST", "CULT", "REPTSTATUS"   Radiology Studies: CT ABDOMEN PELVIS W CONTRAST  Result Date: 09/02/2022 CLINICAL DATA:  Rectal bleeding for 5 days. Worsening today and yesterday. EXAM: CT ABDOMEN AND PELVIS WITH CONTRAST TECHNIQUE: Multidetector CT imaging of the abdomen and pelvis was performed using the standard protocol following bolus administration of intravenous contrast. RADIATION DOSE REDUCTION: This exam was performed according to the departmental dose-optimization program which includes automated exposure control, adjustment of the mA and/or kV according to patient size and/or use of iterative reconstruction technique. CONTRAST:  Abdominal MRI 07/04/2004 COMPARISON:  None Available. FINDINGS: Lower chest: The lung bases are clear. The imaged heart is unremarkable. Hepatobiliary: There is a peripherally calcified hypodense lesion in the right hepatic lobe measuring up to 2.9 cm. This is in the location of a large cyst seen on the abdominal MRI from 2006. Findings are consistent with a benign lesion requiring no specific imaging follow-up. The liver is otherwise unremarkable. The gallbladder is unremarkable. There is no biliary ductal dilatation. Pancreas: Unremarkable. Spleen: Unremarkable. Adrenals/Urinary Tract: There is a 1.5 cm left adrenal nodule measuring 55 Hounsfield units on the portal venous phase and 14 Hounsfield units on the delayed phase, most in keeping with a benign adenoma requiring no  specific imaging follow-up. The right adrenal is unremarkable. There is a 0.8 cm stone in the proximal right ureter with mild fullness of the collecting system. No other stones are seen in either kidney or along the course of either ureter. There is no left hydronephrosis or hydroureter. There are no suspicious parenchymal lesions. The bladder is unremarkable. Stomach/Bowel: There is a small hiatal hernia. The stomach is otherwise unremarkable. There is no evidence of bowel obstruction. There is no evidence of bowel obstruction. There is no abnormal bowel wall thickening or inflammatory change. There is extensive colonic diverticulosis without evidence of acute diverticulitis. Note that evaluation for active extravasation into the bowel is limited on this single-phase study. The appendix is not definitively identified. Vascular/Lymphatic: There is extensive calcified plaque throughout the nonaneurysmal abdominal aorta. The major branch vessels are patent. The main portal and splenic veins are patent. Incidental note is made of a circumaortic  left renal vein. There is no abdominopelvic lymphadenopathy. Reproductive: The uterus is surgically absent. There is no adnexal mass. Other: There is no ascites or free air. Musculoskeletal: Postsurgical changes reflecting left hip arthroplasty are noted. There is age-indeterminate compression deformity of the L4 and L5 vertebral bodies. Otherwise, there is no evidence of acute osseous abnormality or suspicious osseous lesion. IMPRESSION: 1. Extensive colonic diverticulosis without evidence of acute diverticulitis. Note that evaluation for active extravasation into the bowel is limited on this single-phase study. 2. 0.8 cm stone in the proximal right ureter with mild fullness of the collecting system. 3. Age-indeterminate compression deformities of the L4 and L5 vertebral bodies. Correlate with point tenderness. Electronically Signed   By: Lesia Hausen M.D.   On: 09/02/2022  15:25     LOS: 0 days   Lanae Boast, MD [facility]  09/03/2022, 7:48 AM

## 2022-09-04 ENCOUNTER — Encounter (HOSPITAL_COMMUNITY): Payer: Self-pay | Admitting: Internal Medicine

## 2022-09-04 ENCOUNTER — Observation Stay (HOSPITAL_COMMUNITY): Payer: Medicare Other | Admitting: Certified Registered Nurse Anesthetist

## 2022-09-04 ENCOUNTER — Encounter (HOSPITAL_COMMUNITY)
Admission: EM | Disposition: A | Discharge status: Home / Self Care | Payer: Self-pay | Source: Home / Self Care | Attending: Internal Medicine

## 2022-09-04 DIAGNOSIS — K625 Hemorrhage of anus and rectum: Secondary | ICD-10-CM | POA: Diagnosis not present

## 2022-09-04 DIAGNOSIS — J449 Chronic obstructive pulmonary disease, unspecified: Secondary | ICD-10-CM

## 2022-09-04 DIAGNOSIS — Z833 Family history of diabetes mellitus: Secondary | ICD-10-CM | POA: Diagnosis not present

## 2022-09-04 DIAGNOSIS — N201 Calculus of ureter: Secondary | ICD-10-CM | POA: Diagnosis present

## 2022-09-04 DIAGNOSIS — K921 Melena: Secondary | ICD-10-CM | POA: Diagnosis not present

## 2022-09-04 DIAGNOSIS — Z96642 Presence of left artificial hip joint: Secondary | ICD-10-CM | POA: Diagnosis present

## 2022-09-04 DIAGNOSIS — D128 Benign neoplasm of rectum: Secondary | ICD-10-CM

## 2022-09-04 DIAGNOSIS — G629 Polyneuropathy, unspecified: Secondary | ICD-10-CM | POA: Diagnosis present

## 2022-09-04 DIAGNOSIS — I10 Essential (primary) hypertension: Secondary | ICD-10-CM

## 2022-09-04 DIAGNOSIS — D123 Benign neoplasm of transverse colon: Secondary | ICD-10-CM | POA: Diagnosis not present

## 2022-09-04 DIAGNOSIS — Z8249 Family history of ischemic heart disease and other diseases of the circulatory system: Secondary | ICD-10-CM | POA: Diagnosis not present

## 2022-09-04 DIAGNOSIS — D126 Benign neoplasm of colon, unspecified: Secondary | ICD-10-CM | POA: Diagnosis not present

## 2022-09-04 DIAGNOSIS — K922 Gastrointestinal hemorrhage, unspecified: Secondary | ICD-10-CM | POA: Diagnosis not present

## 2022-09-04 DIAGNOSIS — Z87891 Personal history of nicotine dependence: Secondary | ICD-10-CM

## 2022-09-04 DIAGNOSIS — K5731 Diverticulosis of large intestine without perforation or abscess with bleeding: Secondary | ICD-10-CM | POA: Diagnosis present

## 2022-09-04 DIAGNOSIS — Z7982 Long term (current) use of aspirin: Secondary | ICD-10-CM | POA: Diagnosis not present

## 2022-09-04 DIAGNOSIS — K5730 Diverticulosis of large intestine without perforation or abscess without bleeding: Secondary | ICD-10-CM | POA: Diagnosis not present

## 2022-09-04 DIAGNOSIS — Z79899 Other long term (current) drug therapy: Secondary | ICD-10-CM | POA: Diagnosis not present

## 2022-09-04 DIAGNOSIS — K621 Rectal polyp: Secondary | ICD-10-CM | POA: Diagnosis not present

## 2022-09-04 DIAGNOSIS — K635 Polyp of colon: Secondary | ICD-10-CM | POA: Diagnosis not present

## 2022-09-04 DIAGNOSIS — M810 Age-related osteoporosis without current pathological fracture: Secondary | ICD-10-CM | POA: Diagnosis present

## 2022-09-04 DIAGNOSIS — D125 Benign neoplasm of sigmoid colon: Secondary | ICD-10-CM | POA: Diagnosis not present

## 2022-09-04 DIAGNOSIS — Z66 Do not resuscitate: Secondary | ICD-10-CM | POA: Diagnosis present

## 2022-09-04 DIAGNOSIS — Z8261 Family history of arthritis: Secondary | ICD-10-CM | POA: Diagnosis not present

## 2022-09-04 HISTORY — PX: POLYPECTOMY: SHX5525

## 2022-09-04 HISTORY — PX: COLONOSCOPY WITH PROPOFOL: SHX5780

## 2022-09-04 LAB — BASIC METABOLIC PANEL
Anion gap: 5 (ref 5–15)
BUN: 11 mg/dL (ref 8–23)
CO2: 27 mmol/L (ref 22–32)
Calcium: 8.6 mg/dL — ABNORMAL LOW (ref 8.9–10.3)
Chloride: 104 mmol/L (ref 98–111)
Creatinine, Ser: 0.54 mg/dL (ref 0.44–1.00)
GFR, Estimated: >60 mL/min (ref >60)
Glucose, Bld: 91 mg/dL (ref 70–99)
Potassium: 3.5 mmol/L (ref 3.5–5.1)
Sodium: 136 mmol/L (ref 135–145)

## 2022-09-04 LAB — CBC
HCT: 34.7 % — ABNORMAL LOW (ref 36.0–46.0)
Hemoglobin: 11.4 g/dL — ABNORMAL LOW (ref 12.0–15.0)
MCH: 30.3 pg (ref 26.0–34.0)
MCHC: 32.9 g/dL (ref 30.0–36.0)
MCV: 92.3 fL (ref 80.0–100.0)
Platelets: 194 10*3/uL (ref 150–400)
RBC: 3.76 MIL/uL — ABNORMAL LOW (ref 3.87–5.11)
RDW: 13.2 % (ref 11.5–15.5)
WBC: 7.8 10*3/uL (ref 4.0–10.5)
nRBC: 0 % (ref 0.0–0.2)

## 2022-09-04 SURGERY — COLONOSCOPY WITH PROPOFOL
Anesthesia: General

## 2022-09-04 MED ORDER — HYDROCHLOROTHIAZIDE 25 MG PO TABS
25.0000 mg | ORAL_TABLET | Freq: Every day | ORAL | Status: DC
  Administered 2022-09-04 – 2022-09-05 (×2): 25 mg via ORAL
  Filled 2022-09-04 (×2): qty 1

## 2022-09-04 MED ORDER — SODIUM CHLORIDE 0.9 % IV SOLN: INTRAVENOUS | Status: DC

## 2022-09-04 MED ORDER — AMLODIPINE BESYLATE 5 MG PO TABS
10.0000 mg | ORAL_TABLET | Freq: Every day | ORAL | Status: DC
  Administered 2022-09-04 – 2022-09-05 (×2): 10 mg via ORAL
  Filled 2022-09-04 (×2): qty 2

## 2022-09-04 MED ORDER — LACTATED RINGERS IV SOLN: INTRAVENOUS | Status: DC | PRN

## 2022-09-04 MED ORDER — PROPOFOL 500 MG/50ML IV EMUL
INTRAVENOUS | Status: AC
  Filled 2022-09-04: qty 50

## 2022-09-04 MED ORDER — PROPOFOL 10 MG/ML IV BOLUS
INTRAVENOUS | Status: DC | PRN
  Administered 2022-09-04: 40 mg via INTRAVENOUS

## 2022-09-04 MED ORDER — PANTOPRAZOLE SODIUM 40 MG PO TBEC
40.0000 mg | DELAYED_RELEASE_TABLET | Freq: Every day | ORAL | Status: DC
  Administered 2022-09-04 – 2022-09-05 (×2): 40 mg via ORAL
  Filled 2022-09-04 (×2): qty 1

## 2022-09-04 MED ORDER — PROPOFOL 500 MG/50ML IV EMUL
INTRAVENOUS | Status: DC | PRN
  Administered 2022-09-04: 125 ug/kg/min via INTRAVENOUS

## 2022-09-04 NOTE — Transfer of Care (Signed)
Immediate Anesthesia Transfer of Care Note  Patient: Alexis Davis  Procedure(s) Performed: COLONOSCOPY WITH PROPOFOL POLYPECTOMY  Patient Location: PACU  Anesthesia Type:General  Level of Consciousness: drowsy, patient cooperative, and responds to stimulation  Airway & Oxygen Therapy: Patient Spontanous Breathing  Post-op Assessment: Report given to RN, Post -op Vital signs reviewed and stable, Patient moving all extremities X 4, and Patient able to stick tongue midline  Post vital signs: Reviewed  Last Vitals:  Vitals Value Taken Time  BP 114/72   Temp 97.5   Pulse 52 09/04/22 1555  Resp 25 09/04/22 1555  SpO2 100 % 09/04/22 1555  Vitals shown include unvalidated device data.  Last Pain:  Vitals:   09/04/22 1528  TempSrc:   PainSc: 0-No pain         Complications: No notable events documented.

## 2022-09-04 NOTE — Plan of Care (Signed)
  Problem: Education: Goal: Knowledge of General Education information will improve Description Including pain rating scale, medication(s)/side effects and non-pharmacologic comfort measures Outcome: Progressing   

## 2022-09-04 NOTE — Anesthesia Preprocedure Evaluation (Signed)
Anesthesia Evaluation  Patient identified by MRN, date of birth, ID band Patient awake    Reviewed: Allergy & Precautions, H&P , NPO status , Patient's Chart, lab work & pertinent test results, reviewed documented beta blocker date and time   Airway Mallampati: II  TM Distance: >3 FB Neck ROM: full    Dental no notable dental hx.    Pulmonary neg pulmonary ROS, COPD, former smoker   Pulmonary exam normal breath sounds clear to auscultation       Cardiovascular Exercise Tolerance: Good hypertension, negative cardio ROS  Rhythm:regular Rate:Normal     Neuro/Psych  Neuromuscular disease negative neurological ROS  negative psych ROS   GI/Hepatic negative GI ROS, Neg liver ROS,,,  Endo/Other  negative endocrine ROS    Renal/GU negative Renal ROS  negative genitourinary   Musculoskeletal   Abdominal   Peds  Hematology negative hematology ROS (+)   Anesthesia Other Findings   Reproductive/Obstetrics negative OB ROS                             Anesthesia Physical Anesthesia Plan  ASA: 4 and emergent  Anesthesia Plan: General   Post-op Pain Management:    Induction:   PONV Risk Score and Plan: Propofol infusion  Airway Management Planned:   Additional Equipment:   Intra-op Plan:   Post-operative Plan:   Informed Consent: I have reviewed the patients History and Physical, chart, labs and discussed the procedure including the risks, benefits and alternatives for the proposed anesthesia with the patient or authorized representative who has indicated his/her understanding and acceptance.     Dental Advisory Given  Plan Discussed with: CRNA  Anesthesia Plan Comments:        Anesthesia Quick Evaluation

## 2022-09-04 NOTE — Anesthesia Postprocedure Evaluation (Signed)
Anesthesia Post Note  Patient: Alexis Davis  Procedure(s) Performed: COLONOSCOPY WITH PROPOFOL POLYPECTOMY  Patient location during evaluation: Phase II Anesthesia Type: General Level of consciousness: awake Pain management: pain level controlled Vital Signs Assessment: post-procedure vital signs reviewed and stable Respiratory status: spontaneous breathing and respiratory function stable Cardiovascular status: blood pressure returned to baseline and stable Postop Assessment: no headache and no apparent nausea or vomiting Anesthetic complications: no Comments: Late entry   No notable events documented.   Last Vitals:  Vitals:   09/04/22 1615 09/04/22 1634  BP: (!) 140/70 138/68  Pulse: (!) 48 (!) 57  Resp: (!) 22 20  Temp: (!) 36.4 C 36.5 C  SpO2: 100% 100%    Last Pain:  Vitals:   09/04/22 1615  TempSrc:   PainSc: 0-No pain                 Windell Norfolk

## 2022-09-04 NOTE — Progress Notes (Signed)
  Progress Note   Patient: Alexis Davis ZOX:[id number] DOB: Oct 06, 1940 DOA: 09/02/2022     0 DOS: the patient was seen and examined on 09/04/2022   Brief hospital course: Alexis Davis was admitted to the hospital with the working diagnosis of lower GI bleeding.   82 yo female with the past medical history of peripheral neuropathy, who presented with bright red blood per rectum for 48 hrs, no associated abdominal pain. She was evaluated by her primary care and was referred to the hospital for further evaluation. On her initial physical examination her blood pressure was 145/75. HR 63, RR 18 and 02 saturation 97%, lungs with no wheezing or rales, cardiovascular with S1 and S2 present and rhythmic, abdomen with no distention, no lower extremity edema.   Na 136, K 3,8 Cl 103 bicarbonate 25, glucose 103 bun 15 cr 0,58  Wbc 9,3 hgb 13,0 plt 228  CT abdomen with extensive colonic diverticulosis with no evidence of acute diverticulitis.  0,8 cm stone in the proximal right ureter with mild fullness of the collecting system.  Age indeterminate compression deformities L4 and L5 vertebral bodies.    GI was consulted and underwent colonoscopy 07/05.   Assessment and Plan: * Acute lower GI bleeding Colonic diverticulosis. No signs of ongoing bleeding.  Colonoscopy confirming colonic diverticulosis.  Polyps in the rectum, distal rectum and sigmoid colon removed.  Recommendations to hold aspirin until 07.10, low dose aspirin preferred.  Advance diet as tolerated.   Essential hypertension, benign Blood pressure control with amlodipine and hydrochlorothiazide.  COPD, mild (HCC) No signs of exacerbation, continue with as needed albuterol.         Subjective: Patient is feeling better, no chest pain or dyspnea, no further bleeding.   Physical Exam: Vitals:   09/04/22 1554 09/04/22 1600 09/04/22 1615 09/04/22 1634  BP: 114/72 116/64 (!) 140/70 138/68  Pulse: (!) 51 (!) 50 (!) 48 (!) 57   Resp: 18 15 (!) 22 20  Temp: (!) 97.5 F (36.4 C)  (!) 97.5 F (36.4 C) 97.7 F (36.5 C)  TempSrc:      SpO2: 100% 100% 100% 100%  Weight:      Height:       Neurology awake and alert ENT With mild pallor Cardiovascular with S1 and S2 present and rhythmic with  no gallops, rubs or murmurs Respiratory with no rales or wheezing, no rhonchi Abdomen with no distention, or tenderness No lower extremity edema  Data Reviewed:    Family Communication: I spoke with patient's family at the bedside, we talked in detail about patient's condition, plan of care and prognosis and all questions were addressed.   Disposition: Status is: Inpatient Remains inpatient appropriate because: follow up H&H  Planned Discharge Destination: Home     Author: Coralie Keens, MD 09/04/2022 5:09 PM  For on call review [email or web address].

## 2022-09-04 NOTE — Assessment & Plan Note (Signed)
Colonic diverticulosis. No signs of ongoing bleeding.  Colonoscopy confirming colonic diverticulosis.  Polyps in the rectum, distal rectum and sigmoid colon removed.  Recommendations to hold aspirin until 07.10, low dose aspirin preferred.  Advance diet as tolerated.

## 2022-09-04 NOTE — Care Management Obs Status (Signed)
MEDICARE OBSERVATION STATUS NOTIFICATION   Patient Details  Name: Alexis Davis MRN: [id number] Date of Birth: May 14, 1940   Medicare Observation Status Notification Given:  Yes    Corey Harold 09/04/2022, 9:10 AM

## 2022-09-04 NOTE — Progress Notes (Signed)
Patient informed consent for colonoscopy performed by Dr. Jena Gauss , signed and then witnessed by this writer and placed in medical record. Patient remains NPO and completed most of her prep

## 2022-09-04 NOTE — Interval H&P Note (Signed)
History and Physical Interval Note:  09/04/2022 3:19 PM  Alexis Davis  has presented today for surgery, with the diagnosis of Hematochezia.  The various methods of treatment have been discussed with the patient and family. After consideration of risks, benefits and other options for treatment, the patient has consented to  Procedure(s): COLONOSCOPY WITH PROPOFOL (N/A) as a surgical intervention.  The patient's history has been reviewed, patient examined, no change in status, stable for surgery.  I have reviewed the patient's chart and labs.  Questions were answered to the patient's satisfaction.     Eula Listen     Seen and examined in short stay.  Hemoglobin 11.4.  Bleeding has tapered off.  Diagnostic colonoscopy today per plan. The risks, benefits, limitations, alternatives and imponderables have been reviewed with the patient. Questions have been answered. All parties are agreeable.

## 2022-09-04 NOTE — Progress Notes (Signed)
   09/04/22 1634  Vitals  Temp 97.7 F (36.5 C)  BP 138/68  Pulse Rate (!) 57  Resp 20  MEWS COLOR  MEWS Score Color Green  Oxygen Therapy  SpO2 100 %  O2 Device Room Air  MEWS Score  MEWS Temp 0  MEWS Systolic 0  MEWS Pulse 0  MEWS RR 0  MEWS LOC 0  MEWS Score 0

## 2022-09-04 NOTE — Progress Notes (Signed)
Returned from colonoscopy alert and oriented x 4. Able to make needs known no c/o pain or discomfort

## 2022-09-04 NOTE — Op Note (Signed)
Alexis Davis Patient Name: Alexis Davis Procedure Date: 09/04/2022 3:06 PM MRN: [id number] Date of Birth: Feb 22, 1941 Attending MD: Gennette Pac , MD, [id number] CSN: [id number] Age: 82 Admit Type: Outpatient Procedure:                Colonoscopy Indications:              Hematochezia Providers:                Gennette Pac, MD, Buel Ream. Thomasena Edis RN, RN,                            Dyann Ruddle Referring MD:              Medicines:                Propofol per Anesthesia Complications:            No immediate complications. Estimated Blood Loss:     Estimated blood loss was minimal. Procedure:                Pre-Anesthesia Assessment:                           - Prior to the procedure, a History and Physical                            was performed, and patient medications and                            allergies were reviewed. The patient's tolerance of                            previous anesthesia was also reviewed. The risks                            and benefits of the procedure and the sedation                            options and risks were discussed with the patient.                            All questions were answered, and informed consent                            was obtained. Prior Anticoagulants: The patient has                            taken no anticoagulant or antiplatelet agents. ASA                            Grade Assessment: III - A patient with severe                            systemic disease. After reviewing the risks and  benefits, the patient was deemed in satisfactory                            condition to undergo the procedure.                           After obtaining informed consent, the colonoscope                            was passed under direct vision. Throughout the                            procedure, the patient's blood pressure, pulse, and                            oxygen saturations were  monitored continuously. The                            [id number] scope was introduced through                            the anus and advanced to the the cecum, identified                            by appendiceal orifice and ileocecal valve. The                            colonoscopy was performed without difficulty. The                            patient tolerated the procedure well. The quality                            of the bowel preparation was adequate. The                            ileocecal valve, appendiceal orifice, and rectum                            were photographed. The entire colon was well                            visualized. Scope In: 3:30:28 PM Scope Out: 3:49:55 PM Scope Withdrawal Time: 0 hours 8 minutes 55 seconds  Total Procedure Duration: 0 hours 19 minutes 27 seconds  Findings:      The perianal and digital rectal examinations were normal.      Many large-mouthed diverticula were found in the entire colon. Very       densely populated sigmoid diverticula. There was scant clot and old       blood in this segment. No actively bleeding lesion. No suspect bleeding       lesion.      A 15 mm polyp was found in the sigmoid colon. The polyp was sessile. The       polyp was removed with a hot  snare. Resection and retrieval were       complete. Estimated blood loss: none.      Two sessile polyps were found in the rectum, distal rectum, sigmoid       colon and proximal sigmoid colon. The polyps were 3 to 4 mm in size.       These polyps were removed with a cold snare. Resection and retrieval       were complete. Estimated blood loss was minimal.      The exam was otherwise without abnormality on direct and retroflexion       views. Impression:               - Diverticulosis in the entire examined colon.                           - One 15 mm polyp in the sigmoid colon, removed                            with a hot snare. Resected and retrieved.                            - Two 3 to 4 mm polyps in the rectum, in the distal                            rectum, in the sigmoid colon and in the proximal                            sigmoid colon, removed with a cold snare. Resected                            and retrieved.                           - The examination was otherwise normal on direct                            and retroflexion views. I suspect a self-limiting                            diverticular bleed. Polyps were incidental findings. Moderate Sedation:      Moderate (conscious) sedation was personally administered by an       anesthesia professional. The following parameters were monitored: oxygen       saturation, heart rate, blood pressure, respiratory rate, EKG, adequacy       of pulmonary ventilation, and response to care. Recommendation:           - Return patient to hospital ward for ongoing care.                           - Advance diet as tolerated. Follow-up on pathology.                           - Continue present medications. Hold aspirin until  July 10. If continued antiplatelet therapy needed,                            would prefer only a low-dose aspirin in the future.                           - Repeat colonoscopy date to be determined after                            pending pathology results are reviewed for                            surveillance.                           - Return to GI office (date not yet determined). If                            she continues to do well, could be discharged July 6 Procedure Code(s):        --- Professional ---                           [id number], Colonoscopy, flexible; with removal of                            tumor(s), polyp(s), or other lesion(s) by snare                            technique Diagnosis Code(s):        --- Professional ---                           D12.5, Benign neoplasm of sigmoid colon                           D12.8, Benign  neoplasm of rectum                           K92.1, Melena (includes Hematochezia)                           K57.30, Diverticulosis of large intestine without                            perforation or abscess without bleeding CPT copyright 2022 [employer]. All rights reserved. The codes documented in this report are preliminary and upon coder review may  be revised to meet current compliance requirements. Gerrit Friends. Heyden Jaber, MD Gennette Pac, MD 09/04/2022 4:11:54 PM This report has been signed electronically. Number of Addenda: 0

## 2022-09-04 NOTE — Hospital Course (Signed)
Alexis Davis was admitted to the hospital with the working diagnosis of lower GI bleeding.   82 yo female with the past medical history of peripheral neuropathy, who presented with bright red blood per rectum for 48 hrs, no associated abdominal pain. She was evaluated by her primary care and was referred to the hospital for further evaluation. On her initial physical examination her blood pressure was 145/75. HR 63, RR 18 and 02 saturation 97%, lungs with no wheezing or rales, cardiovascular with S1 and S2 present and rhythmic, abdomen with no distention, no lower extremity edema.   Na 136, K 3,8 Cl 103 bicarbonate 25, glucose 103 bun 15 cr 0,58  Wbc 9,3 hgb 13,0 plt 228  CT abdomen with extensive colonic diverticulosis with no evidence of acute diverticulitis.  0,8 cm stone in the proximal right ureter with mild fullness of the collecting system.  Age indeterminate compression deformities L4 and L5 vertebral bodies.    GI was consulted and underwent colonoscopy 07/05.

## 2022-09-05 DIAGNOSIS — J449 Chronic obstructive pulmonary disease, unspecified: Secondary | ICD-10-CM | POA: Diagnosis not present

## 2022-09-05 DIAGNOSIS — K5730 Diverticulosis of large intestine without perforation or abscess without bleeding: Secondary | ICD-10-CM | POA: Diagnosis not present

## 2022-09-05 DIAGNOSIS — I10 Essential (primary) hypertension: Secondary | ICD-10-CM | POA: Diagnosis not present

## 2022-09-05 DIAGNOSIS — K625 Hemorrhage of anus and rectum: Secondary | ICD-10-CM | POA: Diagnosis not present

## 2022-09-05 DIAGNOSIS — K922 Gastrointestinal hemorrhage, unspecified: Secondary | ICD-10-CM | POA: Diagnosis not present

## 2022-09-05 LAB — HEMOGLOBIN AND HEMATOCRIT, BLOOD
HCT: 34.4 % — ABNORMAL LOW (ref 36.0–46.0)
Hemoglobin: 11.4 g/dL — ABNORMAL LOW (ref 12.0–15.0)

## 2022-09-05 MED ORDER — PANTOPRAZOLE SODIUM 40 MG PO TBEC: 40.0000 mg | DELAYED_RELEASE_TABLET | Freq: Every day | ORAL | 1 refills | Status: AC

## 2022-09-05 MED ORDER — ALBUTEROL SULFATE HFA 108 (90 BASE) MCG/ACT IN AERS: 2.0000 | INHALATION_SPRAY | RESPIRATORY_TRACT | 1 refills | Status: AC | PRN

## 2022-09-05 NOTE — Plan of Care (Signed)

## 2022-09-05 NOTE — Progress Notes (Signed)
Patient had a good night.  Tolerating diet.  No blood per rectum.  No abdominal pain  Hemoglobin stable at 11.4 over the past 24 hours  Patient notes she started her aspirin 325 mg last fall when she had hip surgery.  She has been on it ever since.  Vital signs in last 24 hours: Temp:  [97.5 F (36.4 C)-98.8 F (37.1 C)] 98.4 F (36.9 C) (07/06 0417) Pulse Rate:  [48-66] 57 (07/06 0417) Resp:  [15-22] 20 (07/06 0417) BP: (110-140)/(56-72) 117/56 (07/06 0417) SpO2:  [97 %-100 %] 97 % (07/06 0417) Last BM Date : 09/02/22 General:   Alert,  Well-developed, well-nourished, pleasant and cooperative in NAD; multiple family members present. Abdomen: Abdomen soft and nontender  extremities:  Without clubbing or edema.    Intake/Output from previous day: 07/05 0701 - 07/06 0700 In: 600 [P.O.:200; I.V.:400] Out: 0  Intake/Output this shift: No intake/output data recorded.  Lab Results: Recent Labs    09/02/22 1315 09/02/22 2358 09/03/22 1628 09/04/22 0520 09/05/22 0415  WBC 9.3  --   --  7.8  --   HGB 13.0   < > 12.6 11.4* 11.4*  HCT 39.6   < > 37.9 34.7* 34.4*  PLT 228  --   --  194  --    < > = values in this interval not displayed.   BMET Recent Labs    09/02/22 1315 09/04/22 0520  NA 136 136  K 3.8 3.5  CL 103 104  CO2 25 27  GLUCOSE 103* 91  BUN 15 11  CREATININE 0.58 0.54  CALCIUM 9.2 8.6*   LFT Recent Labs    09/02/22 1315  PROT 6.4*  ALBUMIN 3.5  AST 20  ALT 15  ALKPHOS 60  BILITOT 0.5   PT/INR No results for input(s): "LABPROT", "INR" in the last 72 hours. Hepatitis Panel No results for input(s): "HEPBSAG", "HCVAB", "HEPAIGM", "HEPBIGM" in the last 72 hours. C-Diff No results for input(s): "CDIFFTOX" in the last 72 hours.  Studies/Results: No results found.   Impression: Very pleasant 82 year old lady admitted with painless rectal bleeding CTA negative for bleeder.  Colonoscopy yesterday demonstrated extensive colonic diverticulosis.  Some  clot and old blood in the colon but no suspect lesion found.  Incidentally, found to have multiple colonic polyps which were resected.  I suspect diverticular bleed.  Bleeding has resolved clinically.  Recommendations:  From a GI standpoint, can be discharged today.  Need to hold aspirin any dose until seen by PCP.  She may not need aspirin at all moving forward, certainly not full dose therapy.  Follow-up on pathology report from polyps removed.  Office visit with [place] in 2 months  - I will arrange.

## 2022-09-05 NOTE — Discharge Summary (Signed)
Physician Discharge Summary   Patient: Alexis Davis MRN: [id number] DOB: 02-23-1941  Admit date:     09/02/2022  Discharge date:   Discharge Physician: Vassie Loll   PCP: Billie Lade, MD   Recommendations at discharge:  Repeat CBC to follow hemoglobin trend/stability Make sure patient follow-up with gastroenterology service as instructed Final decision regarding discontinuation of aspirin; or at least using low-dose enteric coated aspirin instead of full dose.   Discharge Diagnoses: Principal Problem:   Acute lower GI bleeding Active Problems:   Essential hypertension, benign   COPD, mild (HCC)  Resolved Problems:   * No resolved hospital problems. [facility] Course: Alexis Davis was admitted to the hospital with the working diagnosis of lower GI bleeding.   82 yo female with the past medical history of peripheral neuropathy, who presented with bright red blood per rectum for 48 hrs, no associated abdominal pain. She was evaluated by her primary care and was referred to the hospital for further evaluation. On her initial physical examination her blood pressure was 145/75. HR 63, RR 18 and 02 saturation 97%, lungs with no wheezing or rales, cardiovascular with S1 and S2 present and rhythmic, abdomen with no distention, no lower extremity edema.   Na 136, K 3,8 Cl 103 bicarbonate 25, glucose 103 bun 15 cr 0,58  Wbc 9,3 hgb 13,0 plt 228  CT abdomen with extensive colonic diverticulosis with no evidence of acute diverticulitis.  0,8 cm stone in the proximal right ureter with mild fullness of the collecting system.  Age indeterminate compression deformities L4 and L5 vertebral bodies.    GI was consulted and underwent colonoscopy 07/05.   Assessment and Plan: * Acute lower GI bleeding Colonic diverticulosis. No signs of ongoing bleeding appreciated; polyps removed and GI will follow on culture results.. Recommendations to hold aspirin until follow up with PCP and if  needed maybe using only enteric coated baby aspirin.. Patient discharge on daily protonix. Follow-up with gastroenterology service in 56-month.  Essential hypertension, benign Continue home antihypertensive agents -Heart healthy diet discussed with patient.  COPD, mild (HCC) -Continue as needed bronchodilator management -Continue patient follow-up with pulmonologist. -No acute exacerbation appreciated during this hospitalization.   Consultants: GI service Procedures performed: Colonoscopy; see below for x-ray reports. Disposition: Home Diet recommendation: Heart healthy diet.  DISCHARGE MEDICATION: Allergies as of 09/05/2022   No Known Allergies      Medication List     STOP taking these medications    aspirin EC 325 MG tablet       TAKE these medications    albuterol 108 (90 Base) MCG/ACT inhaler Commonly known as: VENTOLIN HFA Inhale 2 puffs into the lungs every 4 (four) hours as needed for wheezing or shortness of breath.   alendronate 70 MG tablet Commonly known as: FOSAMAX Take 70 mg by mouth once a week. Monday   amLODipine 10 MG tablet Commonly known as: NORVASC Take 1 tablet (10 mg total) by mouth daily.   CALTRATE 600+D PO Take 1 tablet by mouth 2 (two) times daily.   COD LIVER OIL PO Take 1 capsule by mouth daily.   hydrochlorothiazide 25 MG tablet Commonly known as: HYDRODIURIL Take 1 tablet (25 mg total) by mouth daily.   pantoprazole 40 MG tablet Commonly known as: PROTONIX Take 1 tablet (40 mg total) by mouth daily. Start taking on: September 06, 2022   Vitamin D3 25 MCG (1000 UT) Caps Take 1 capsule (1,000 Units total) by mouth daily.  Follow-up Information     Billie Lade, MD. Schedule an appointment as soon as possible for a visit in 10 day(s).   Specialty: Internal Medicine Contact information: [address] [place] [address] [phone]                Discharge Exam: Ceasar Mons Weights   09/02/22  1229 09/02/22 2137  Weight: 74.4 kg 73.5 kg   General exam: Alert, awake, oriented x 3 Respiratory system: Clear to auscultation. Respiratory effort normal. Cardiovascular system:RRR. No murmurs, rubs, gallops. Gastrointestinal system: Abdomen is nondistended, soft and nontender. No organomegaly or masses felt. Normal bowel sounds heard. Central nervous system: Alert and oriented. No focal neurological deficits. Extremities: No C/C/E, +pedal pulses Skin: No rashes, lesions or ulcers Psychiatry: Judgement and insight appear normal. Mood & affect appropriate.    Condition at discharge: Stable and improved.  The results of significant diagnostics from this hospitalization (including imaging, microbiology, ancillary and laboratory) are listed below for reference.   Imaging Studies: CT ABDOMEN PELVIS W CONTRAST  Result Date: 09/02/2022 CLINICAL DATA:  Rectal bleeding for 5 days. Worsening today and yesterday. EXAM: CT ABDOMEN AND PELVIS WITH CONTRAST TECHNIQUE: Multidetector CT imaging of the abdomen and pelvis was performed using the standard protocol following bolus administration of intravenous contrast. RADIATION DOSE REDUCTION: This exam was performed according to the departmental dose-optimization program which includes automated exposure control, adjustment of the mA and/or kV according to patient size and/or use of iterative reconstruction technique. CONTRAST:  Abdominal MRI 07/04/2004 COMPARISON:  None Available. FINDINGS: Lower chest: The lung bases are clear. The imaged heart is unremarkable. Hepatobiliary: There is a peripherally calcified hypodense lesion in the right hepatic lobe measuring up to 2.9 cm. This is in the location of a large cyst seen on the abdominal MRI from 2006. Findings are consistent with a benign lesion requiring no specific imaging follow-up. The liver is otherwise unremarkable. The gallbladder is unremarkable. There is no biliary ductal dilatation. Pancreas:  Unremarkable. Spleen: Unremarkable. Adrenals/Urinary Tract: There is a 1.5 cm left adrenal nodule measuring 55 Hounsfield units on the portal venous phase and 14 Hounsfield units on the delayed phase, most in keeping with a benign adenoma requiring no specific imaging follow-up. The right adrenal is unremarkable. There is a 0.8 cm stone in the proximal right ureter with mild fullness of the collecting system. No other stones are seen in either kidney or along the course of either ureter. There is no left hydronephrosis or hydroureter. There are no suspicious parenchymal lesions. The bladder is unremarkable. Stomach/Bowel: There is a small hiatal hernia. The stomach is otherwise unremarkable. There is no evidence of bowel obstruction. There is no evidence of bowel obstruction. There is no abnormal bowel wall thickening or inflammatory change. There is extensive colonic diverticulosis without evidence of acute diverticulitis. Note that evaluation for active extravasation into the bowel is limited on this single-phase study. The appendix is not definitively identified. Vascular/Lymphatic: There is extensive calcified plaque throughout the nonaneurysmal abdominal aorta. The major branch vessels are patent. The main portal and splenic veins are patent. Incidental note is made of a circumaortic left renal vein. There is no abdominopelvic lymphadenopathy. Reproductive: The uterus is surgically absent. There is no adnexal mass. Other: There is no ascites or free air. Musculoskeletal: Postsurgical changes reflecting left hip arthroplasty are noted. There is age-indeterminate compression deformity of the L4 and L5 vertebral bodies. Otherwise, there is no evidence of acute osseous abnormality or suspicious osseous  lesion. IMPRESSION: 1. Extensive colonic diverticulosis without evidence of acute diverticulitis. Note that evaluation for active extravasation into the bowel is limited on this single-phase study. 2. 0.8 cm stone in  the proximal right ureter with mild fullness of the collecting system. 3. Age-indeterminate compression deformities of the L4 and L5 vertebral bodies. Correlate with point tenderness. Electronically Signed   By: Lesia Hausen M.D.   On: 09/02/2022 15:25    Microbiology: Results for orders placed or performed in visit on 03/23/22  Microscopic Examination     Status: None   Collection Time: 03/23/22 10:59 AM  Result Value Ref Range Status   WBC, UA 0-5 0 - 5 /hpf Final   RBC, Urine None seen 0 - 2 /hpf Final   Epithelial Cells (non renal) 0-10 0 - 10 /hpf Final   Casts None seen None seen /lpf Final   Bacteria, UA Few None seen/Few Final  Urine Culture, Reflex     Status: None   Collection Time: 03/23/22 10:59 AM  Result Value Ref Range Status   Urine Culture, Routine Final report  Final   Organism ID, Bacteria No growth  Final    Labs: CBC: Recent Labs  Lab 09/02/22 1315 09/02/22 2358 09/03/22 0805 09/03/22 1628 09/04/22 0520 09/05/22 0415  WBC 9.3  --   --   --  7.8  --   HGB 13.0 12.1 12.5 12.6 11.4* 11.4*  HCT 39.6 37.0 37.5 37.9 34.7* 34.4*  MCV 91.5  --   --   --  92.3  --   PLT 228  --   --   --  194  --    Basic Metabolic Panel: Recent Labs  Lab 09/02/22 1315 09/04/22 0520  NA 136 136  K 3.8 3.5  CL 103 104  CO2 25 27  GLUCOSE 103* 91  BUN 15 11  CREATININE 0.58 0.54  CALCIUM 9.2 8.6*   Liver Function Tests: Recent Labs  Lab 09/02/22 1315  AST 20  ALT 15  ALKPHOS 60  BILITOT 0.5  PROT 6.4*  ALBUMIN 3.5   CBG: No results for input(s): "GLUCAP" in the last 168 hours.  Discharge time spent: greater than 30 minutes.  Signed: Vassie Loll, MD [facility] 09/05/2022

## 2022-09-07 ENCOUNTER — Telehealth: Payer: Self-pay

## 2022-09-07 ENCOUNTER — Encounter: Payer: Self-pay | Admitting: Gastroenterology

## 2022-09-07 NOTE — Transitions of Care (Post Inpatient/ED Visit) (Addendum)
   09/07/2022  Name: Alexis Davis MRN: [id number] DOB: 07-Apr-1940  Today's TOC FU Call Status: Today's TOC FU Call Status:: Unsuccessul Call (1st Attempt) Unsuccessful Call (1st Attempt) Date: 09/07/22  Attempted to reach the patient regarding the most recent Inpatient/ED visit.  Follow Up Plan: Additional outreach attempts will be made to reach the patient to complete the Transitions of Care (Post Inpatient/ED visit) call.   Jodelle Gross, RN, BSN, CCM Care Management Coordinator [place]/[facility] Phone: [phone]/Fax: [phone]

## 2022-09-08 ENCOUNTER — Encounter (HOSPITAL_COMMUNITY): Payer: Self-pay | Admitting: Internal Medicine

## 2022-09-08 ENCOUNTER — Telehealth: Payer: Self-pay

## 2022-09-08 NOTE — Transitions of Care (Post Inpatient/ED Visit) (Signed)
   09/08/2022  Name: Alexis Davis MRN: [id number] DOB: [birth year]  Today's TOC FU Call Status: Today's TOC FU Call Status:: Unsuccessful Call (2nd Attempt) Unsuccessful Call (2nd Attempt) Date: 09/08/22  Attempted to reach the patient regarding the most recent Inpatient/ED visit.  Follow Up Plan: Additional outreach attempts will be made to reach the patient to complete the Transitions of Care (Post Inpatient/ED visit) call.   Jodelle Gross, RN, BSN, CCM Care Management Coordinator [place]/[facility] Phone: [phone]/Fax: [phone]

## 2022-09-09 ENCOUNTER — Telehealth: Payer: Self-pay

## 2022-09-09 LAB — SURGICAL PATHOLOGY

## 2022-09-09 NOTE — Transitions of Care (Post Inpatient/ED Visit) (Signed)
   09/09/2022  Name: Alexis Davis MRN: [id number] DOB: 1940-05-26  Today's TOC FU Call Status: Today's TOC FU Call Status:: Unsuccessful Call (3rd Attempt) Unsuccessful Call (3rd Attempt) Date: 09/09/22  Attempted to reach the patient regarding the most recent Inpatient/ED visit.  Follow Up Plan: No further outreach attempts will be made at this time. We have been unable to contact the patient.  Jodelle Gross, RN, BSN, CCM Care Management Coordinator [place]/[facility]

## 2022-09-12 ENCOUNTER — Encounter: Payer: Self-pay | Admitting: Internal Medicine

## 2022-09-14 ENCOUNTER — Inpatient Hospital Stay: Payer: Medicare Other | Admitting: Internal Medicine

## 2022-09-22 ENCOUNTER — Ambulatory Visit (INDEPENDENT_AMBULATORY_CARE_PROVIDER_SITE_OTHER): Payer: Medicare Other | Admitting: Internal Medicine

## 2022-09-22 ENCOUNTER — Encounter: Payer: Self-pay | Admitting: Internal Medicine

## 2022-09-22 VITALS — BP 120/75 | HR 79 | Ht 64.0 in | Wt 162.1 lb

## 2022-09-22 DIAGNOSIS — K625 Hemorrhage of anus and rectum: Secondary | ICD-10-CM | POA: Diagnosis not present

## 2022-09-22 NOTE — Patient Instructions (Signed)
Thank you, Ms.Alexis Davis for allowing [place] to provide your care today.   Go by the lab.  Go to bed at the same time and wake up at the same time. Aim for 8 hours of sleep per day.  We will check your CBC today Follow up with Dr.Dixon sooner if your fatigue does not improve in a month You have follow up scheduled 10/23 with Dr.Dixon     Thurmon Fair, M.D.

## 2022-09-22 NOTE — Assessment & Plan Note (Addendum)
Patient here for follow up after hospital admission for rectal bleeding. No black or red stools since discharge. She has some fatigue which started around hospital admission. Her hgb was always 11 to 12.  She appears well on exam today. She was on Asprin after hip replacement in November 2023. No history of stroke or MI. She will not restart Asprin at this time. I suspect fatigue is related to recent illness, but should improve over the next month. We will check CBC to insure patient has stable hemoglobin.   Check CBC Aim for 8 hours of sleep per day. Set a time to go to bed and a time to wake up.  Follow up with Dr.Dixon sooner if your fatigue does not improve in a month You have follow up scheduled 10/23 with Dr.Dixon

## 2022-09-22 NOTE — Progress Notes (Signed)
   HPI:Ms.MARTIZA SPETH is a 82 y.o. female who presents for evaluation of Follow-up (Toc f/u d/c on 09/12/22, pt reports feeling good. )    Physical Exam: Vitals:   09/22/22 0920  BP: 120/75  Pulse: 79  SpO2: 97%  Weight: 162 lb 1.9 oz (73.5 kg)  Height: 5\' 4"  (1.626 m)     Physical Exam Constitutional:      General: She is not in acute distress.    Appearance: She is not ill-appearing.  Eyes:     Conjunctiva/sclera: Conjunctivae normal.  Cardiovascular:     Rate and Rhythm: Normal rate and regular rhythm.     Heart sounds: No murmur heard. Pulmonary:     Effort: Pulmonary effort is normal.     Breath sounds: No wheezing or rales.  Abdominal:     General: Bowel sounds are normal.     Palpations: Abdomen is soft.     Tenderness: There is no abdominal tenderness.      Assessment & Plan:   Kaitlynn was seen today for follow-up.  Rectal bleeding Assessment & Plan: Patient here for follow up after hospital admission for rectal bleeding. No black or red stools since discharge. She has some fatigue which started around hospital admission. Her hgb was always 11 to 12.  She appears well on exam today. She was on Asprin after hip replacement in November 2023. No history of stroke or MI. She will not restart Asprin at this time. I suspect fatigue is related to recent illness, but should improve over the next month. We will check CBC to insure patient has stable hemoglobin.   Check CBC Aim for 8 hours of sleep per day. Set a time to go to bed and a time to wake up.  Follow up with Dr.Dixon sooner if your fatigue does not improve in a month You have follow up scheduled 10/23 with Dr.Dixon  Orders: -     CBC      Milus Banister, MD

## 2022-09-23 LAB — CBC
Hematocrit: 38 % (ref 34.0–46.6)
Hemoglobin: 12.6 g/dL (ref 11.1–15.9)
MCH: 30.7 pg (ref 26.6–33.0)
MCHC: 33.2 g/dL (ref 31.5–35.7)
MCV: 93 fL (ref 79–97)
Platelets: 273 10*3/uL (ref 150–450)
RBC: 4.11 x10E6/uL (ref 3.77–5.28)
RDW: 12.5 % (ref 11.7–15.4)
WBC: 7.1 10*3/uL (ref 3.4–10.8)

## 2022-10-01 ENCOUNTER — Encounter: Payer: Self-pay | Admitting: Internal Medicine

## 2022-10-28 ENCOUNTER — Encounter: Payer: Self-pay | Admitting: Family Medicine

## 2022-10-28 ENCOUNTER — Ambulatory Visit: Payer: Medicare Other | Admitting: Family Medicine

## 2022-10-28 VITALS — BP 128/72 | HR 74 | Temp 97.7°F | Ht 64.0 in | Wt 159.0 lb

## 2022-10-28 DIAGNOSIS — R051 Acute cough: Secondary | ICD-10-CM

## 2022-10-28 DIAGNOSIS — R059 Cough, unspecified: Secondary | ICD-10-CM | POA: Insufficient documentation

## 2022-10-28 MED ORDER — PREDNISONE 20 MG PO TABS: 20.0000 mg | ORAL_TABLET | Freq: Two times a day (BID) | ORAL | 0 refills | Status: AC

## 2022-10-28 MED ORDER — GUAIFENESIN 100 MG/5ML PO LIQD
5.0000 mL | ORAL | 0 refills | Status: AC | PRN
Start: 2022-10-28 — End: ?

## 2022-10-28 NOTE — Assessment & Plan Note (Signed)
Trial on Prednisone 20 mg  twice a day x 5 days Robitussin syrup for cough Advise Symptomatic treatment, rest, increase oral fluid intake. Take OTC tylenol for pain or fever Follow-up for worsening or persistent symptoms. Patient verbalizes understanding regarding plan of care and all questions answered

## 2022-10-28 NOTE — Patient Instructions (Signed)

## 2022-10-28 NOTE — Progress Notes (Signed)
Patient Office Visit   Subjective   Patient ID: Alexis Davis, female    DOB: Jun 20, 1940  Age: 82 y.o. MRN: [id number]  CC:  Chief Complaint  Patient presents with   Nasal Congestion   Cough    X 3 days, congestion feels loose but cannot get it up     HPI Alexis Davis 82 year old female, presents to the clinic for worsening cough started 3 days ago and now worsening.She  has a past medical history of Arthritis, Compression fracture of L4 lumbar vertebra, COPD (chronic obstructive pulmonary disease) (HCC) (03/02/2008), Hepatic cyst, Hypertension, Osteoporosis, and Thrombophlebitis of superficial veins of left lower extremity (02/05/2022).  Cough This is a new problem. The current episode started in the past 7 days. The problem has been gradually worsening. The problem occurs constantly. The cough is Non-productive. Pertinent negatives include no chest pain, chills, fever, headaches, hemoptysis, nasal congestion, rhinorrhea, shortness of breath or sweats. The symptoms are aggravated by lying down. She has tried OTC cough suppressant for the symptoms. The treatment provided no relief. There is no history of asthma, bronchiectasis, bronchitis or environmental allergies.      Outpatient Encounter Medications as of 10/28/2022  Medication Sig   albuterol (VENTOLIN HFA) 108 (90 Base) MCG/ACT inhaler Inhale 2 puffs into the lungs every 4 (four) hours as needed for wheezing or shortness of breath.   alendronate (FOSAMAX) 70 MG tablet Take 70 mg by mouth once a week. Monday   amLODipine (NORVASC) 10 MG tablet Take 1 tablet (10 mg total) by mouth daily.   Calcium Carbonate-Vitamin D (CALTRATE 600+D PO) Take 1 tablet by mouth 2 (two) times daily.   Cholecalciferol (VITAMIN D3) 25 MCG (1000 UT) CAPS Take 1 capsule (1,000 Units total) by mouth daily.   COD LIVER OIL PO Take 1 capsule by mouth daily.   guaiFENesin (ROBITUSSIN) 100 MG/5ML liquid Take 5 mLs by mouth every 4 (four) hours as  needed for cough or to loosen phlegm.   hydrochlorothiazide (HYDRODIURIL) 25 MG tablet Take 1 tablet (25 mg total) by mouth daily.   pantoprazole (PROTONIX) 40 MG tablet Take 1 tablet (40 mg total) by mouth daily.   predniSONE (DELTASONE) 20 MG tablet Take 1 tablet (20 mg total) by mouth 2 (two) times daily with a meal for 5 days.   No facility-administered encounter medications on file as of 10/28/2022.    Past Surgical History:  Procedure Laterality Date   ABDOMINAL HYSTERECTOMY     CATARACT EXTRACTION W/PHACO Right 08/23/2014   Procedure: CATARACT EXTRACTION PHACO AND INTRAOCULAR LENS PLACEMENT RIGHT EYE; CDE:  10.80;  Surgeon: Gemma Payor, MD;  Location: [facility];  Service: Ophthalmology;  Laterality: Right;   CATARACT EXTRACTION W/PHACO Left 10/08/2014   Procedure: CATARACT EXTRACTION PHACO AND INTRAOCULAR LENS PLACEMENT (IOC);  Surgeon: Gemma Payor, MD;  Location: [facility];  Service: Ophthalmology;  Laterality: Left;  CDE 6.60   COLONOSCOPY WITH PROPOFOL N/A 09/04/2022   Procedure: COLONOSCOPY WITH PROPOFOL;  Surgeon: Corbin Ade, MD;  Location: [facility];  Service: Endoscopy;  Laterality: N/A;   POLYPECTOMY  09/04/2022   Procedure: POLYPECTOMY;  Surgeon: Corbin Ade, MD;  Location: [facility];  Service: Endoscopy;;  hot snare   TOTAL HIP ARTHROPLASTY Left 01/07/2022   Procedure: LEFT TOTAL HIP ARTHROPLASTY ANTERIOR APPROACH;  Surgeon: Eldred Manges, MD;  Location: [facility] OR;  Service: Orthopedics;  Laterality: Left;    Review of Systems  Constitutional:  Negative for  chills and fever.  HENT:  Negative for rhinorrhea.   Eyes:  Negative for blurred vision.  Respiratory:  Positive for cough. Negative for hemoptysis and shortness of breath.   Cardiovascular:  Negative for chest pain.  Neurological:  Negative for dizziness and headaches.  Endo/Heme/Allergies:  Negative for environmental allergies.      Objective    BP 128/72   Pulse 74   Temp 97.7 F (36.5 C) (Oral)   Ht 5\' 4"   (1.626 m)   Wt 159 lb (72.1 kg)   SpO2 93%   BMI 27.29 kg/m   Physical Exam Vitals reviewed.  Constitutional:      General: She is not in acute distress.    Appearance: Normal appearance. She is not ill-appearing, toxic-appearing or diaphoretic.  HENT:     Head: Normocephalic.  Eyes:     General:        Right eye: No discharge.        Left eye: No discharge.     Conjunctiva/sclera: Conjunctivae normal.  Cardiovascular:     Rate and Rhythm: Normal rate.     Pulses: Normal pulses.     Heart sounds: Normal heart sounds.  Pulmonary:     Effort: Pulmonary effort is normal. No respiratory distress.     Breath sounds: Normal breath sounds.  Chest:     Chest wall: No tenderness.  Musculoskeletal:        General: Normal range of motion.     Cervical back: Normal range of motion.  Skin:    General: Skin is warm and dry.     Capillary Refill: Capillary refill takes less than 2 seconds.  Neurological:     General: No focal deficit present.     Mental Status: She is alert.     Coordination: Coordination normal.     Gait: Gait normal.  Psychiatric:        Mood and Affect: Mood normal.        Behavior: Behavior normal.       Assessment & Plan:  Acute cough Assessment & Plan: Trial on Prednisone 20 mg  twice a day x 5 days Robitussin syrup for cough Advise Symptomatic treatment, rest, increase oral fluid intake. Take OTC tylenol for pain or fever Follow-up for worsening or persistent symptoms. Patient verbalizes understanding regarding plan of care and all questions answered   Orders: -     COVID-19, Flu A+B and RSV -     predniSONE; Take 1 tablet (20 mg total) by mouth 2 (two) times daily with a meal for 5 days.  Dispense: 10 tablet; Refill: 0 -     guaiFENesin; Take 5 mLs by mouth every 4 (four) hours as needed for cough or to loosen phlegm.  Dispense: 120 mL; Refill: 0    Return if symptoms worsen or fail to improve.   Cruzita Lederer Newman Nip, FNP

## 2022-10-30 LAB — COVID-19, FLU A+B AND RSV
Influenza A, NAA: NOT DETECTED
Influenza B, NAA: NOT DETECTED
RSV, NAA: NOT DETECTED
SARS-CoV-2, NAA: DETECTED — AB

## 2022-10-31 NOTE — Progress Notes (Signed)
Please inform patient,  Results positive for Covid.   I advise Rest and Hydration:  Rest: Ensure you get plenty of rest to help your body fight the virus. Avoid strenuous activities  Drink plenty of fluids, such as water, herbal teas, or broths, to stay hydrated. This helps maintain your body's functions and can alleviate some symptoms.  Fever and Pain: Over-the-counter medications like acetaminophen (Tylenol) can help manage fever, headaches, and body aches. Cough and Sore Throat: Stay hydrated and consider using a humidifier or saline nasal spray to soothe a sore throat and ease coughing. Honey and warm tea can also provide relief.   Stay Isolated: Remain at home and avoid contact with others as much as possible to prevent spreading the virus.   Wear a Mask: If you must be around others, wear a mask to reduce the risk of transmission

## 2022-11-04 ENCOUNTER — Encounter: Payer: Self-pay | Admitting: Internal Medicine

## 2022-11-05 ENCOUNTER — Telehealth: Payer: Self-pay | Admitting: Internal Medicine

## 2022-11-05 ENCOUNTER — Other Ambulatory Visit: Payer: Self-pay | Admitting: Family Medicine

## 2022-11-05 MED ORDER — BENZONATATE 200 MG PO CAPS: 200.0000 mg | ORAL_CAPSULE | Freq: Two times a day (BID) | ORAL | 0 refills | Status: DC | PRN

## 2022-11-05 NOTE — Telephone Encounter (Signed)
Patient called in regard to visit on 8/27 w. Tenna Child Was told by provider that if symptoms  persisted to call back.  Patient is still experiencing cough and unable to cough up any phlegm  Which is causing under rib pain. Wants to see if provider can send in something else to help with cough.

## 2022-11-05 NOTE — Telephone Encounter (Signed)
Patient advised.

## 2022-11-05 NOTE — Telephone Encounter (Signed)
I sent in benzonatate 200 mg PRN to her pharmacy

## 2022-11-06 DIAGNOSIS — R0789 Other chest pain: Secondary | ICD-10-CM | POA: Diagnosis not present

## 2022-12-05 NOTE — Progress Notes (Unsigned)
Referring Provider: Billie Lade, MD Primary Care Physician:  Billie Lade, MD Primary GI Physician: Dr. Jena Gauss  No chief complaint on file.   HPI:   Alexis Davis is a 82 y.o. female with history of COPD, neuropathy, HTN, arthritis, presenting today for hospital follow-up of diverticular bleed.   Patient presented to the ER 7/3 with 3 days of painless dark red blood per rectum with clots. Denied any associated symptoms. Constipation and straining over the past few weeks but none the past few days. No other otc NSAIDs besides 389m ASA.  Hgb was stable at 13 on admission. CT A/P with extensive diverticulosis, no diverticulitis. Hgb declined to 11.4. Colonoscopy completed 7/5 with pancolonic diverticulosis, 15 mm polyp  removed from sigmoid, two 3-4 mm polyps removed from rectum/sigmoid colon. Suspected self limiting diverticular bleed. Polyps were incidental findings.Recommended holding aspirin until f/u with PCP and resuming 81 mg aspirin only if needed.   Colonoscopy pathology showed tubular adenomas ans 1 rectal hyperplastic polyp. Recommend repeat colonoscopy in 3 years, if overall health permits.   Hgb improved to 12.6 on 09/22/22.     Past Medical History:  Diagnosis Date   Arthritis    Shoulder   Compression fracture of L4 lumbar vertebra    Pt did not have pain, osteoporosis   COPD (chronic obstructive pulmonary disease) (HCC) 03/02/2008   PFT   Hepatic cyst    Hypertension    Osteoporosis    Thrombophlebitis of superficial veins of left lower extremity 02/05/2022    Past Surgical History:  Procedure Laterality Date   ABDOMINAL HYSTERECTOMY     CATARACT EXTRACTION W/PHACO Right 08/23/2014   Procedure: CATARACT EXTRACTION PHACO AND INTRAOCULAR LENS PLACEMENT RIGHT EYE; CDE:  10.80;  Surgeon: Gemma Payor, MD;  Location: [facility];  Service: Ophthalmology;  Laterality: Right;   CATARACT EXTRACTION W/PHACO Left 10/08/2014   Procedure: CATARACT EXTRACTION PHACO AND  INTRAOCULAR LENS PLACEMENT (IOC);  Surgeon: Gemma Payor, MD;  Location: [facility];  Service: Ophthalmology;  Laterality: Left;  CDE 6.60   COLONOSCOPY WITH PROPOFOL N/A 09/04/2022   Procedure: COLONOSCOPY WITH PROPOFOL;  Surgeon: Corbin Ade, MD;  Location: [facility];  Service: Endoscopy;  Laterality: N/A;   POLYPECTOMY  09/04/2022   Procedure: POLYPECTOMY;  Surgeon: Corbin Ade, MD;  Location: [facility];  Service: Endoscopy;;  hot snare   TOTAL HIP ARTHROPLASTY Left 01/07/2022   Procedure: LEFT TOTAL HIP ARTHROPLASTY ANTERIOR APPROACH;  Surgeon: Eldred Manges, MD;  Location: [facility] OR;  Service: Orthopedics;  Laterality: Left;    Current Outpatient Medications  Medication Sig Dispense Refill   albuterol (VENTOLIN HFA) 108 (90 Base) MCG/ACT inhaler Inhale 2 puffs into the lungs every 4 (four) hours as needed for wheezing or shortness of breath. 18 g 1   alendronate (FOSAMAX) 70 MG tablet Take 70 mg by mouth once a week. Monday     amLODipine (NORVASC) 10 MG tablet Take 1 tablet (10 mg total) by mouth daily. 90 tablet 0   benzonatate (TESSALON) 200 MG capsule Take 1 capsule (200 mg total) by mouth 2 (two) times daily as needed for cough. 20 capsule 0   Calcium Carbonate-Vitamin D (CALTRATE 600+D PO) Take 1 tablet by mouth 2 (two) times daily.     Cholecalciferol (VITAMIN D3) 25 MCG (1000 UT) CAPS Take 1 capsule (1,000 Units total) by mouth daily. 60 capsule 3   COD LIVER OIL PO Take 1 capsule by mouth daily.  guaiFENesin (ROBITUSSIN) 100 MG/5ML liquid Take 5 mLs by mouth every 4 (four) hours as needed for cough or to loosen phlegm. 120 mL 0   hydrochlorothiazide (HYDRODIURIL) 25 MG tablet Take 1 tablet (25 mg total) by mouth daily. 90 tablet 2   pantoprazole (PROTONIX) 40 MG tablet Take 1 tablet (40 mg total) by mouth daily. 30 tablet 1   No current facility-administered medications for this visit.    Allergies as of 12/07/2022   (No Known Allergies)    Family History  Problem  Relation Age of Onset   Diabetes Mother    Heart disease Father    Hypertension Sister    Hypertension Sister    Hypertension Sister    Hypertension Brother    Diabetes Mellitus II Son    Arthritis Other    Breast cancer Neg Hx    Colon cancer Neg Hx    Cancer - Lung Neg Hx     Social History   Socioeconomic History   Marital status: Married    Spouse name: Jomarie Longs   Number of children: 3   Years of education: 12   Highest education level: Not on file  Occupational History   Not on file  Tobacco Use   Smoking status: Former    Types: Cigarettes   Smokeless tobacco: Never   Tobacco comments:    She quit smoking in January 2022. Smoked for 20 years, a pack usually last her a week.  Vaping Use   Vaping status: Never Used  Substance and Sexual Activity   Alcohol use: No   Drug use: No   Sexual activity: Yes  Other Topics Concern   Not on file  Social History Narrative   Lives at home with her husband, has 3 children, pt is retired.   Social Determinants of Health   Financial Resource Strain: Low Risk  (07/18/2019)   Overall Financial Resource Strain (CARDIA)    Difficulty of Paying Living Expenses: Not very hard  Food Insecurity: No Food Insecurity (09/02/2022)   Hunger Vital Sign    Worried About Running Out of Food in the Last Year: Never true    Ran Out of Food in the Last Year: Never true  Transportation Needs: No Transportation Needs (09/02/2022)   PRAPARE - Administrator, Civil Service (Medical): No    Lack of Transportation (Non-Medical): No  Physical Activity: Not on file  Stress: Not on file  Social Connections: Not on file    Review of Systems: Gen: Denies fever, chills, anorexia. Denies fatigue, weakness, weight loss.  CV: Denies chest pain, palpitations, syncope, peripheral edema, and claudication. Resp: Denies dyspnea at rest, cough, wheezing, coughing up blood, and pleurisy. GI: Denies vomiting blood, jaundice, and fecal incontinence.    Denies dysphagia or odynophagia. Derm: Denies rash, itching, dry skin Psych: Denies depression, anxiety, memory loss, confusion. No homicidal or suicidal ideation.  Heme: Denies bruising, bleeding, and enlarged lymph nodes.  Physical Exam: There were no vitals taken for this visit. General:   Alert and oriented. No distress noted. Pleasant and cooperative.  Head:  Normocephalic and atraumatic. Eyes:  Conjuctiva clear without scleral icterus. Heart:  S1, S2 present without murmurs appreciated. Lungs:  Clear to auscultation bilaterally. No wheezes, rales, or rhonchi. No distress.  Abdomen:  +BS, soft, non-tender and non-distended. No rebound or guarding. No HSM or masses noted. Msk:  Symmetrical without gross deformities. Normal posture. Extremities:  Without edema. Neurologic:  Alert and  oriented x4 Psych:  Normal mood and affect.    Assessment:     Plan:  ***   Ermalinda Memos, PA-C [facility] 12/07/2022

## 2022-12-07 ENCOUNTER — Encounter: Payer: Self-pay | Admitting: Gastroenterology

## 2022-12-07 ENCOUNTER — Ambulatory Visit (INDEPENDENT_AMBULATORY_CARE_PROVIDER_SITE_OTHER): Payer: Medicare Other | Admitting: Gastroenterology

## 2022-12-07 VITALS — BP 119/68 | HR 76 | Temp 98.1°F | Ht 64.0 in | Wt 156.6 lb

## 2022-12-07 DIAGNOSIS — Z09 Encounter for follow-up examination after completed treatment for conditions other than malignant neoplasm: Secondary | ICD-10-CM | POA: Diagnosis not present

## 2022-12-07 DIAGNOSIS — Z8719 Personal history of other diseases of the digestive system: Secondary | ICD-10-CM

## 2022-12-07 DIAGNOSIS — Z860101 Personal history of adenomatous and serrated colon polyps: Secondary | ICD-10-CM | POA: Diagnosis not present

## 2022-12-07 NOTE — Patient Instructions (Addendum)
Will plan to follow-up with you as needed.  As Dr. Jena Gauss recommended, we can consider 1 more colonoscopy in 3 years if your overall health permits.  You should receive a letter closer to time for this.   It was nice to meet you today!   Ermalinda Memos, PA-C [facility]

## 2022-12-23 ENCOUNTER — Encounter: Payer: Self-pay | Admitting: Internal Medicine

## 2022-12-23 ENCOUNTER — Ambulatory Visit: Payer: Medicare Other | Admitting: Internal Medicine

## 2022-12-23 VITALS — BP 111/67 | HR 73 | Ht 64.0 in | Wt 156.0 lb

## 2022-12-23 DIAGNOSIS — I10 Essential (primary) hypertension: Secondary | ICD-10-CM

## 2022-12-23 DIAGNOSIS — Z2821 Immunization not carried out because of patient refusal: Secondary | ICD-10-CM | POA: Diagnosis not present

## 2022-12-23 DIAGNOSIS — J449 Chronic obstructive pulmonary disease, unspecified: Secondary | ICD-10-CM | POA: Diagnosis not present

## 2022-12-23 DIAGNOSIS — Z8719 Personal history of other diseases of the digestive system: Secondary | ICD-10-CM

## 2022-12-23 DIAGNOSIS — D539 Nutritional anemia, unspecified: Secondary | ICD-10-CM | POA: Diagnosis not present

## 2022-12-23 DIAGNOSIS — R5383 Other fatigue: Secondary | ICD-10-CM | POA: Diagnosis not present

## 2022-12-23 DIAGNOSIS — R42 Dizziness and giddiness: Secondary | ICD-10-CM | POA: Insufficient documentation

## 2022-12-23 DIAGNOSIS — M810 Age-related osteoporosis without current pathological fracture: Secondary | ICD-10-CM | POA: Diagnosis not present

## 2022-12-23 MED ORDER — MECLIZINE HCL 25 MG PO TABS: 25.0000 mg | ORAL_TABLET | Freq: Three times a day (TID) | ORAL | 0 refills | Status: AC | PRN

## 2022-12-23 NOTE — Patient Instructions (Signed)
It was a pleasure to see you today.  Thank you for giving [place] the opportunity to be involved in your care.  Below is a brief recap of your visit and next steps.  We will plan to see you again in 3 months.  Summary Trial meclizine for dizziness relief Repeat labs Follow up in 3 months

## 2022-12-23 NOTE — Progress Notes (Signed)
Established Patient Office Visit  Subjective   Patient ID: Alexis Davis, female    DOB: 1940/04/06  Age: 82 y.o. MRN: [id number]  Chief Complaint  Patient presents with   COPD    Six month follow up    Dizziness    Patient is dizzy every morning then during the day    Ms. Freeland returns to care today for routine follow-up.  Last evaluated by me on 4/23.  No medication changes were made that time, repeat DEXA scan ordered, and 59-month follow-up was arranged.  In the interim, she was admitted to [facility] in early July for lower GI bleed.  Treated for COVID-19 in August and, most recently, was seen by GI for follow-up on 10/7.  She reports feeling poorly today, noting a 3-week history of dizziness occurring every morning and periodically throughout the day.  Symptoms occur while standing.  She also endorses increasing fatigue.  Her husband died 2 months ago and she does not have much of an appetite.  She has lost 9 pounds since her last appointment.  Past Medical History:  Diagnosis Date   Arthritis    Shoulder   Compression fracture of L4 lumbar vertebra    Pt did not have pain, osteoporosis   COPD (chronic obstructive pulmonary disease) (HCC) 03/02/2008   PFT   Hepatic cyst    Hypertension    Osteoporosis    Thrombophlebitis of superficial veins of left lower extremity 02/05/2022   Past Surgical History:  Procedure Laterality Date   ABDOMINAL HYSTERECTOMY     CATARACT EXTRACTION W/PHACO Right 08/23/2014   Procedure: CATARACT EXTRACTION PHACO AND INTRAOCULAR LENS PLACEMENT RIGHT EYE; CDE:  10.80;  Surgeon: Gemma Payor, MD;  Location: [facility];  Service: Ophthalmology;  Laterality: Right;   CATARACT EXTRACTION W/PHACO Left 10/08/2014   Procedure: CATARACT EXTRACTION PHACO AND INTRAOCULAR LENS PLACEMENT (IOC);  Surgeon: Gemma Payor, MD;  Location: [facility];  Service: Ophthalmology;  Laterality: Left;  CDE 6.60   COLONOSCOPY WITH PROPOFOL N/A 09/04/2022   15 mm polyp  removed from sigmoid, two 3-4 mm polyps removed from rectum/sigmoid colon.  Pathology with tubular adenomas.   POLYPECTOMY  09/04/2022   Procedure: POLYPECTOMY;  Surgeon: Corbin Ade, MD;  [facility];  Service: Endoscopy;;  hot snare   TOTAL HIP ARTHROPLASTY Left 01/07/2022   Procedure: LEFT TOTAL HIP ARTHROPLASTY ANTERIOR APPROACH;  Surgeon: Eldred Manges, MD;  Location: [facility] OR;  Service: Orthopedics;  Laterality: Left;   Social History   Tobacco Use   Smoking status: Former    Types: Cigarettes   Smokeless tobacco: Never   Tobacco comments:    She quit smoking in January 2022. Smoked for 20 years, a pack usually last her a week.  Vaping Use   Vaping status: Never Used  Substance Use Topics   Alcohol use: No   Drug use: No   Family History  Problem Relation Age of Onset   Diabetes Mother    Heart disease Father    Hypertension Sister    Hypertension Sister    Hypertension Sister    Hypertension Brother    Diabetes Mellitus II Son    Arthritis Other    Breast cancer Neg Hx    Colon cancer Neg Hx    Cancer - Lung Neg Hx    No Known Allergies    Review of Systems  Constitutional:  Positive for malaise/fatigue.  Neurological:  Positive for dizziness.  Objective:     BP 111/67 (BP Location: Left Arm, Patient Position: Sitting, Cuff Size: Normal)   Pulse 73   Ht 5\' 4"  (1.626 m)   Wt 156 lb (70.8 kg)   SpO2 97%   BMI 26.78 kg/m  BP Readings from Last 3 Encounters:  12/23/22 111/67  12/07/22 119/68  10/28/22 128/72   Physical Exam Vitals reviewed.  Constitutional:      General: She is not in acute distress.    Appearance: Normal appearance. She is not toxic-appearing.  HENT:     Head: Normocephalic and atraumatic.     Right Ear: External ear normal.     Left Ear: External ear normal.     Nose: Nose normal. No congestion or rhinorrhea.     Mouth/Throat:     Mouth: Mucous membranes are moist.     Pharynx: Oropharynx is clear. No  oropharyngeal exudate or posterior oropharyngeal erythema.  Eyes:     General: No scleral icterus.    Extraocular Movements: Extraocular movements intact.     Conjunctiva/sclera: Conjunctivae normal.     Pupils: Pupils are equal, round, and reactive to light.  Cardiovascular:     Rate and Rhythm: Normal rate and regular rhythm.     Pulses: Normal pulses.     Heart sounds: Normal heart sounds. No murmur heard.    No friction rub. No gallop.  Pulmonary:     Effort: Pulmonary effort is normal.     Breath sounds: Normal breath sounds. No wheezing, rhonchi or rales.  Abdominal:     General: Abdomen is flat. Bowel sounds are normal. There is no distension.     Palpations: Abdomen is soft.     Tenderness: There is no abdominal tenderness.  Musculoskeletal:        General: No swelling. Normal range of motion.     Cervical back: Normal range of motion.     Right lower leg: No edema.     Left lower leg: No edema.  Lymphadenopathy:     Cervical: No cervical adenopathy.  Skin:    General: Skin is warm and dry.     Capillary Refill: Capillary refill takes less than 2 seconds.     Coloration: Skin is not jaundiced.  Neurological:     General: No focal deficit present.     Mental Status: She is alert and oriented to person, place, and time.  Psychiatric:        Mood and Affect: Mood normal.        Behavior: Behavior normal.   Last CBC Lab Results  Component Value Date   WBC 7.1 09/22/2022   HGB 12.6 09/22/2022   HCT 38.0 09/22/2022   MCV 93 09/22/2022   MCH 30.7 09/22/2022   RDW 12.5 09/22/2022   PLT 273 09/22/2022   Last metabolic panel Lab Results  Component Value Date   GLUCOSE 91 09/04/2022   NA 136 09/04/2022   K 3.5 09/04/2022   CL 104 09/04/2022   CO2 27 09/04/2022   BUN 11 09/04/2022   CREATININE 0.54 09/04/2022   GFRNONAA >60 09/04/2022   CALCIUM 8.6 (L) 09/04/2022   PROT 6.4 (L) 09/02/2022   ALBUMIN 3.5 09/02/2022   LABGLOB 2.4 07/01/2021   AGRATIO 1.7  07/01/2021   BILITOT 0.5 09/02/2022   ALKPHOS 60 09/02/2022   AST 20 09/02/2022   ALT 15 09/02/2022   ANIONGAP 5 09/04/2022   Last lipids Lab Results  Component Value Date   CHOL 167  07/01/2021   HDL 56 07/01/2021   LDLCALC 96 07/01/2021   TRIG 77 07/01/2021   CHOLHDL 3.0 07/01/2021   Last hemoglobin A1c Lab Results  Component Value Date   HGBA1C 5.6 07/01/2021   Last thyroid functions Lab Results  Component Value Date   TSH 1.520 07/01/2021   Last vitamin D Lab Results  Component Value Date   VD25OH 33.0 07/01/2021   Last vitamin B12 and Folate Lab Results  Component Value Date   VITAMINB12 449 07/19/2019   FOLATE 15.3 11/24/2017     Assessment & Plan:   Problem List Items Addressed This Visit       Essential hypertension, benign    Remains adequately controlled on current antihypertensive regimen.  No medication changes are indicated today.      COPD, mild (HCC)    Pulmonary exam is unremarkable.  She remains asymptomatic.  Symptoms are well-controlled with as needed use of albuterol.      Osteoporosis    T-score improved and -2.2 on DEXA from April.  She remains on vitamin D and calcium supplementation, but reports that she stopped Fosamax in July during a dental extraction per dentist recommendations.      Fatigue    She endorses worsening fatigue, noting that she does not have any energy nor an appetite.  Her husband died 2 months ago and she has not been eating well.  She has lost 9 pounds since last evaluated by me in April.  She has not appreciated any blood in her stool since July. -Repeat labs ordered today to assess for contributing metabolic etiologies      Dizziness    Her acute concern today is dizziness.  This occurs mainly with standing and abrupt movements.  Not necessarily associated with changes in position such as standing from a seated position or getting out of bed.  She describes spinning sensations.  Orthostatics in office today  were negative.  Cardiac examination was unremarkable. -Trial meclizine for suspected vertigo -Repeat labs ordered today      Return in about 3 months (around 03/25/2023).   Billie Lade, MD

## 2022-12-23 NOTE — Assessment & Plan Note (Signed)
She endorses worsening fatigue, noting that she does not have any energy nor an appetite.  Her husband died 2 months ago and she has not been eating well.  She has lost 9 pounds since last evaluated by me in April.  She has not appreciated any blood in her stool since July. -Repeat labs ordered today to assess for contributing metabolic etiologies

## 2022-12-23 NOTE — Assessment & Plan Note (Signed)
T-score improved and -2.2 on DEXA from April.  She remains on vitamin D and calcium supplementation, but reports that she stopped Fosamax in July during a dental extraction per dentist recommendations.

## 2022-12-23 NOTE — Assessment & Plan Note (Signed)
Her acute concern today is dizziness.  This occurs mainly with standing and abrupt movements.  Not necessarily associated with changes in position such as standing from a seated position or getting out of bed.  She describes spinning sensations.  Orthostatics in office today were negative.  Cardiac examination was unremarkable. -Trial meclizine for suspected vertigo -Repeat labs ordered today

## 2022-12-23 NOTE — Assessment & Plan Note (Signed)
 Remains adequately controlled on current antihypertensive regimen.  No medication changes are indicated today.

## 2022-12-23 NOTE — Assessment & Plan Note (Signed)
Pulmonary exam is unremarkable.  She remains asymptomatic.  Symptoms are well-controlled with as needed use of albuterol.

## 2022-12-24 LAB — CBC WITH DIFFERENTIAL/PLATELET
Basophils Absolute: 0.1 10*3/uL (ref 0.0–0.2)
Basos: 1 %
EOS (ABSOLUTE): 0.1 10*3/uL (ref 0.0–0.4)
Eos: 1 %
Hematocrit: 44.6 % (ref 34.0–46.6)
Hemoglobin: 13.9 g/dL (ref 11.1–15.9)
Immature Grans (Abs): 0 10*3/uL (ref 0.0–0.1)
Immature Granulocytes: 0 %
Lymphocytes Absolute: 2.4 10*3/uL (ref 0.7–3.1)
Lymphs: 27 %
MCH: 28.7 pg (ref 26.6–33.0)
MCHC: 31.2 g/dL — ABNORMAL LOW (ref 31.5–35.7)
MCV: 92 fL (ref 79–97)
Monocytes Absolute: 0.9 10*3/uL (ref 0.1–0.9)
Monocytes: 10 %
Neutrophils Absolute: 5.6 10*3/uL (ref 1.4–7.0)
Neutrophils: 61 %
Platelets: 218 10*3/uL (ref 150–450)
RBC: 4.84 x10E6/uL (ref 3.77–5.28)
RDW: 12.9 % (ref 11.7–15.4)
WBC: 9.1 10*3/uL (ref 3.4–10.8)

## 2022-12-24 LAB — VITAMIN D 25 HYDROXY (VIT D DEFICIENCY, FRACTURES): Vit D, 25-Hydroxy: 37 ng/mL (ref 30.0–100.0)

## 2022-12-24 LAB — B12 AND FOLATE PANEL
Folate: 5.9 ng/mL (ref >3.0)
Vitamin B-12: 388 pg/mL (ref 232–1245)

## 2022-12-24 LAB — CMP14+EGFR
ALT: 7 [IU]/L (ref 0–32)
AST: 17 [IU]/L (ref 0–40)
Albumin: 3.9 g/dL (ref 3.7–4.7)
Alkaline Phosphatase: 76 [IU]/L (ref 44–121)
BUN/Creatinine Ratio: 16 (ref 12–28)
BUN: 11 mg/dL (ref 8–27)
Bilirubin Total: 0.4 mg/dL (ref 0.0–1.2)
CO2: 25 mmol/L (ref 20–29)
Calcium: 9.5 mg/dL (ref 8.7–10.3)
Chloride: 99 mmol/L (ref 96–106)
Creatinine, Ser: 0.67 mg/dL (ref 0.57–1.00)
Globulin, Total: 2.6 g/dL (ref 1.5–4.5)
Glucose: 87 mg/dL (ref 70–99)
Potassium: 3.9 mmol/L (ref 3.5–5.2)
Sodium: 139 mmol/L (ref 134–144)
Total Protein: 6.5 g/dL (ref 6.0–8.5)
eGFR: 87 mL/min/{1.73_m2} (ref >59)

## 2022-12-24 LAB — IRON,TIBC AND FERRITIN PANEL
Ferritin: 79 ng/mL (ref 15–150)
Iron Saturation: 13 % — ABNORMAL LOW (ref 15–55)
Iron: 44 ug/dL (ref 27–139)
Total Iron Binding Capacity: 351 ug/dL (ref 250–450)
UIBC: 307 ug/dL (ref 118–369)

## 2022-12-24 LAB — TSH+FREE T4
Free T4: 1.19 ng/dL (ref 0.82–1.77)
TSH: 1.28 u[IU]/mL (ref 0.450–4.500)

## 2023-02-01 ENCOUNTER — Telehealth: Payer: Self-pay | Admitting: Internal Medicine

## 2023-02-01 ENCOUNTER — Other Ambulatory Visit: Payer: Self-pay

## 2023-02-01 DIAGNOSIS — I10 Essential (primary) hypertension: Secondary | ICD-10-CM

## 2023-02-01 MED ORDER — HYDROCHLOROTHIAZIDE 25 MG PO TABS: 25.0000 mg | ORAL_TABLET | Freq: Every day | ORAL | 2 refills | Status: AC

## 2023-02-01 MED ORDER — AMLODIPINE BESYLATE 10 MG PO TABS: 10.0000 mg | ORAL_TABLET | Freq: Every day | ORAL | 0 refills | Status: DC

## 2023-02-01 NOTE — Telephone Encounter (Signed)
Prescription Request  02/01/2023  LOV: 12/23/2022  What is the name of the medication or equipment? hydrochlorothiazide (HYDRODIURIL) 25 MG tablet [[id number]]  amLODipine (NORVASC) 10 MG tablet [[id number]]  PT IS COMPLETELY OUT OF MEDS  Have you contacted your pharmacy to request a refill? No   Which pharmacy would you like this sent to?  [facility] - [place], [place] - [address] [place] [address] Phone: [phone] Fax: [phone]    Patient notified that their request is being sent to the clinical staff for review and that they should receive a response within 2 business days.   Please advise at [facility] [phone]

## 2023-02-01 NOTE — Telephone Encounter (Signed)
Refills sent to pharmacy. 

## 2023-02-15 ENCOUNTER — Encounter: Payer: Self-pay | Admitting: Internal Medicine

## 2023-02-15 ENCOUNTER — Ambulatory Visit (INDEPENDENT_AMBULATORY_CARE_PROVIDER_SITE_OTHER): Payer: Medicare Other | Admitting: Internal Medicine

## 2023-02-15 VITALS — BP 108/66 | HR 67 | Ht 64.0 in | Wt 154.4 lb

## 2023-02-15 DIAGNOSIS — F4321 Adjustment disorder with depressed mood: Secondary | ICD-10-CM | POA: Insufficient documentation

## 2023-02-15 DIAGNOSIS — R42 Dizziness and giddiness: Secondary | ICD-10-CM

## 2023-02-15 DIAGNOSIS — I10 Essential (primary) hypertension: Secondary | ICD-10-CM | POA: Diagnosis not present

## 2023-02-15 DIAGNOSIS — F4320 Adjustment disorder, unspecified: Secondary | ICD-10-CM

## 2023-02-15 DIAGNOSIS — F411 Generalized anxiety disorder: Secondary | ICD-10-CM

## 2023-02-15 MED ORDER — HYDROXYZINE HCL 10 MG PO TABS: 10.0000 mg | ORAL_TABLET | Freq: Two times a day (BID) | ORAL | 1 refills | Status: AC | PRN

## 2023-02-15 NOTE — Patient Instructions (Addendum)
Please take Hydroxyzine as needed for anxiety.  Please start taking hydrochlorothiazide only half tablet once daily.  Please continue to follow low salt diet and ambulate as tolerated.

## 2023-02-15 NOTE — Assessment & Plan Note (Signed)
Has persistent anxiety/jitteriness since losing her husband Started hydroxyzine 10 mg twice daily as needed for anxiety She is planning to stay at her children's place during Christmas, which may improve her symptoms

## 2023-02-15 NOTE — Progress Notes (Signed)
Acute Office Visit  Subjective:    Patient ID: Alexis Davis, female    DOB: [birth year], 82 y.o.   MRN: [id number]  Chief Complaint  Patient presents with   Anxiety    Nerves since losing husband two months ago    Weakness    Patient feels weak and off balance     HPI Patient is in today for complaint of dizziness, especially upon standing and walking.  She also reports feeling weak and off balance.  Of note, she lost her husband about 2 months ago.  She has been feeling anxious and lonely at home.  She attributes her symptoms to it.  She denies any change in her appetite or sleep currently.  She has had dizzy spells in the past, likely attributable to vertigo and has tried meclizine without much relief.  She denies any spells of nausea/vomiting or diarrhea recently.  Her BP is borderline low.  Orthostatic vitals were negative today.  She takes amlodipine 10 mg QD and HCTZ 25 mg QD currently.  She denies any chest pain, dyspnea or palpitations currently.  Past Medical History:  Diagnosis Date   Arthritis    Shoulder   Compression fracture of L4 lumbar vertebra    Pt did not have pain, osteoporosis   COPD (chronic obstructive pulmonary disease) (HCC) 03/02/2008   PFT   Hepatic cyst    Hypertension    Osteoporosis    Thrombophlebitis of superficial veins of left lower extremity 02/05/2022    Past Surgical History:  Procedure Laterality Date   ABDOMINAL HYSTERECTOMY     CATARACT EXTRACTION W/PHACO Right 08/23/2014   Procedure: CATARACT EXTRACTION PHACO AND INTRAOCULAR LENS PLACEMENT RIGHT EYE; CDE:  10.80;  Surgeon: Gemma Payor, MD;  Location: [facility];  Service: Ophthalmology;  Laterality: Right;   CATARACT EXTRACTION W/PHACO Left 10/08/2014   Procedure: CATARACT EXTRACTION PHACO AND INTRAOCULAR LENS PLACEMENT (IOC);  Surgeon: Gemma Payor, MD;  Location: [facility];  Service: Ophthalmology;  Laterality: Left;  CDE 6.60   COLONOSCOPY WITH PROPOFOL N/A 09/04/2022   15 mm polyp  removed from sigmoid, two 3-4 mm polyps removed from rectum/sigmoid colon.  Pathology with tubular adenomas.   POLYPECTOMY  09/04/2022   Procedure: POLYPECTOMY;  Surgeon: Corbin Ade, MD;  Location: [facility];  Service: Endoscopy;;  hot snare   TOTAL HIP ARTHROPLASTY Left 01/07/2022   Procedure: LEFT TOTAL HIP ARTHROPLASTY ANTERIOR APPROACH;  Surgeon: Eldred Manges, MD;  Location: [facility];  Service: Orthopedics;  Laterality: Left;    Family History  Problem Relation Age of Onset   Diabetes Mother    Heart disease Father    Hypertension Sister    Hypertension Sister    Hypertension Sister    Hypertension Brother    Diabetes Mellitus II Son    Arthritis Other    Breast cancer Neg Hx    Colon cancer Neg Hx    Cancer - Lung Neg Hx     Social History   Socioeconomic History   Marital status: Married    Spouse name: Jomarie Longs   Number of children: 3   Years of education: 12   Highest education level: Not on file  Occupational History   Not on file  Tobacco Use   Smoking status: Former    Types: Cigarettes   Smokeless tobacco: Never   Tobacco comments:    She quit smoking in January 2022. Smoked for 20 years, a pack usually last her a week.  Vaping Use   Vaping status: Never Used  Substance and Sexual Activity   Alcohol use: No   Drug use: No   Sexual activity: Yes  Other Topics Concern   Not on file  Social History Narrative   Lives at home with her husband, has 3 children, pt is retired.   Social Drivers of Corporate investment banker Strain: Low Risk  (07/18/2019)   Overall Financial Resource Strain (CARDIA)    Difficulty of Paying Living Expenses: Not very hard  Food Insecurity: No Food Insecurity (09/02/2022)   Hunger Vital Sign    Worried About Running Out of Food in the Last Year: Never true    Ran Out of Food in the Last Year: Never true  Transportation Needs: No Transportation Needs (09/02/2022)   PRAPARE - Administrator, Civil Service  (Medical): No    Lack of Transportation (Non-Medical): No  Physical Activity: Not on file  Stress: Not on file  Social Connections: Not on file  Intimate Partner Violence: Not At Risk (09/02/2022)   Humiliation, Afraid, Rape, and Kick questionnaire    Fear of Current or Ex-Partner: No    Emotionally Abused: No    Physically Abused: No    Sexually Abused: No    Outpatient Medications Prior to Visit  Medication Sig Dispense Refill   albuterol (VENTOLIN HFA) 108 (90 Base) MCG/ACT inhaler Inhale 2 puffs into the lungs every 4 (four) hours as needed for wheezing or shortness of breath. 18 g 1   amLODipine (NORVASC) 10 MG tablet Take 1 tablet (10 mg total) by mouth daily. 90 tablet 0   Calcium Carbonate-Vitamin D (CALTRATE 600+D PO) Take 1 tablet by mouth 2 (two) times daily.     Cholecalciferol (VITAMIN D3) 25 MCG (1000 UT) CAPS Take 1 capsule (1,000 Units total) by mouth daily. 60 capsule 3   COD LIVER OIL PO Take 1 capsule by mouth daily.     guaiFENesin (ROBITUSSIN) 100 MG/5ML liquid Take 5 mLs by mouth every 4 (four) hours as needed for cough or to loosen phlegm. 120 mL 0   hydrochlorothiazide (HYDRODIURIL) 25 MG tablet Take 1 tablet (25 mg total) by mouth daily. (Patient taking differently: Take 12.5 mg by mouth daily.) 90 tablet 2   meclizine (ANTIVERT) 25 MG tablet Take 1 tablet (25 mg total) by mouth 3 (three) times daily as needed for dizziness. 30 tablet 0   pantoprazole (PROTONIX) 40 MG tablet Take 1 tablet (40 mg total) by mouth daily. 30 tablet 1   No facility-administered medications prior to visit.    No Known Allergies  Review of Systems  Constitutional:  Positive for fatigue. Negative for chills and fever.  HENT:  Negative for congestion, sinus pressure, sinus pain and sore throat.   Eyes:  Negative for pain and discharge.  Respiratory:  Negative for cough and shortness of breath.   Cardiovascular:  Negative for chest pain and palpitations.  Gastrointestinal:  Negative  for diarrhea, nausea and vomiting.  Endocrine: Negative for polydipsia and polyuria.  Genitourinary:  Negative for dysuria and hematuria.  Musculoskeletal:  Negative for neck pain and neck stiffness.  Skin:  Negative for rash.  Neurological:  Positive for dizziness and weakness (B/l LE).  Psychiatric/Behavioral:  Negative for agitation and behavioral problems. The patient is nervous/anxious.        Objective:    Physical Exam Vitals reviewed.  Constitutional:      General: She is not in acute distress.  Appearance: She is not diaphoretic.  HENT:     Head: Normocephalic and atraumatic.     Mouth/Throat:     Mouth: Mucous membranes are moist.  Eyes:     General: No scleral icterus.    Extraocular Movements: Extraocular movements intact.  Cardiovascular:     Rate and Rhythm: Normal rate and regular rhythm.     Heart sounds: Normal heart sounds. No murmur heard. Pulmonary:     Breath sounds: Normal breath sounds. No wheezing or rales.  Musculoskeletal:     Cervical back: Neck supple. No tenderness.     Right lower leg: No edema.     Left lower leg: No edema.  Skin:    General: Skin is warm.     Findings: No rash.  Neurological:     General: No focal deficit present.     Mental Status: She is alert and oriented to person, place, and time.     Sensory: No sensory deficit.     Gait: Gait normal.  Psychiatric:        Mood and Affect: Mood normal.        Behavior: Behavior normal.     BP 108/66 (BP Location: Left Arm, Patient Position: Sitting, Cuff Size: Normal)   Pulse 67   Ht 5\' 4"  (1.626 m)   Wt 154 lb 6.4 oz (70 kg)   SpO2 97%   BMI 26.50 kg/m  Wt Readings from Last 3 Encounters:  02/15/23 154 lb 6.4 oz (70 kg)  12/23/22 156 lb (70.8 kg)  12/07/22 156 lb 9.6 oz (71 kg)        Assessment & Plan:   Problem List Items Addressed This Visit       Cardiovascular and Mediastinum   Essential hypertension, benign - Primary   BP Readings from Last 1  Encounters:  02/15/23 108/66   Well-controlled with amlodipine 10 mg QD and HCTZ 25 mg QD Considering tightly controlled BP and her current dizziness, advised to take only half tablet of HCTZ QD for now Counseled for compliance with the medications Advised low salt diet         Other   Dizziness   Orthostatic vitals negative Due to BP being on low normal range, decreased dose of hydrochlorothiazide Recent BMP reviewed Advised to maintain adequate hydration Has tried meclizine without much relief      GAD (generalized anxiety disorder)   Has persistent anxiety/jitteriness since losing her husband Started hydroxyzine 10 mg twice daily as needed for anxiety She is planning to stay at her children's place during Christmas, which may improve her symptoms      Relevant Medications   hydrOXYzine (ATARAX) 10 MG tablet   Grief reaction   Recently lost her husband She is planning to stay at her children's place during Christmas, which may improve her symptoms Goes to church, has local friends as well        Meds ordered this encounter  Medications   hydrOXYzine (ATARAX) 10 MG tablet    Sig: Take 1 tablet (10 mg total) by mouth every 12 (twelve) hours as needed for anxiety.    Dispense:  30 tablet    Refill:  1     Michelene Keniston Concha Se, MD

## 2023-02-15 NOTE — Assessment & Plan Note (Signed)
BP Readings from Last 1 Encounters:  02/15/23 108/66   Well-controlled with amlodipine 10 mg QD and HCTZ 25 mg QD Considering tightly controlled BP and her current dizziness, advised to take only half tablet of HCTZ QD for now Counseled for compliance with the medications Advised low salt diet

## 2023-02-15 NOTE — Assessment & Plan Note (Addendum)
Recently lost her husband She is planning to stay at her children's place during Christmas, which may improve her symptoms Goes to church, has local friends as well

## 2023-02-15 NOTE — Assessment & Plan Note (Signed)
Orthostatic vitals negative Due to BP being on low normal range, decreased dose of hydrochlorothiazide Recent BMP reviewed Advised to maintain adequate hydration Has tried meclizine without much relief

## 2023-03-25 ENCOUNTER — Encounter: Payer: Self-pay | Admitting: Internal Medicine

## 2023-03-25 ENCOUNTER — Ambulatory Visit (INDEPENDENT_AMBULATORY_CARE_PROVIDER_SITE_OTHER): Payer: Medicare Other | Admitting: Internal Medicine

## 2023-03-25 VITALS — BP 137/74 | HR 65 | Ht 64.0 in | Wt 161.2 lb

## 2023-03-25 DIAGNOSIS — R5383 Other fatigue: Secondary | ICD-10-CM

## 2023-03-25 DIAGNOSIS — E611 Iron deficiency: Secondary | ICD-10-CM | POA: Diagnosis not present

## 2023-03-25 DIAGNOSIS — G629 Polyneuropathy, unspecified: Secondary | ICD-10-CM

## 2023-03-25 DIAGNOSIS — R42 Dizziness and giddiness: Secondary | ICD-10-CM

## 2023-03-25 DIAGNOSIS — J449 Chronic obstructive pulmonary disease, unspecified: Secondary | ICD-10-CM

## 2023-03-25 DIAGNOSIS — I10 Essential (primary) hypertension: Secondary | ICD-10-CM

## 2023-03-25 MED ORDER — GABAPENTIN 300 MG PO CAPS: 300.0000 mg | ORAL_CAPSULE | Freq: Three times a day (TID) | ORAL | Status: AC

## 2023-03-25 NOTE — Assessment & Plan Note (Signed)
Symptoms are unchanged despite reducing her antihypertensive regimen.  She cannot identify any triggers for her symptoms.  Symptoms previously not alleviated with meclizine.  She believes it may be related to her eyes and is scheduled to see optometry next week.

## 2023-03-25 NOTE — Assessment & Plan Note (Signed)
Asymptomatic currently.  Pulmonary exam is unremarkable.  She continues to use albuterol as needed.

## 2023-03-25 NOTE — Patient Instructions (Signed)
It was a pleasure to see you today.  Thank you for giving [place] the opportunity to be involved in your care.  Below is a brief recap of your visit and next steps.  We will plan to see you again in 3 months.  Summary Increase nightly dose of gabapentin to 600 mg Repeat labs ordered Follow up in 3 months

## 2023-03-25 NOTE — Assessment & Plan Note (Signed)
She endorses poorly controlled neuropathic pain in her feet.  Symptoms are worse at night.  She is currently prescribed gabapentin 300 mg 3 times daily.  Recommend increasing her evening dose to 600 mg.

## 2023-03-25 NOTE — Assessment & Plan Note (Signed)
Remains adequately controlled on current antihypertensive regimen.  HCTZ was reduced to 12.5 mg daily at her last appointment in case low-normal BP readings were contributing to symptoms of dizziness.  She continues to take amlodipine 10 mg daily.  No further changes are indicated today.

## 2023-03-25 NOTE — Progress Notes (Signed)
Established Patient Office Visit  Subjective   Patient ID: Alexis Davis, female    DOB: 1940-10-14  Age: 83 y.o. MRN: [id number]  Chief Complaint  Patient presents with   Care Management    Three month follow up   [name] returns to care today for routine follow-up.  She was last evaluated by me in October 2024.  She endorsed fatigue at that time.  Repeat labs were ordered to assess for contributing metabolic etiologies.  Meclizine was also prescribed for as needed relief of dizziness.  She presented to [facility] for an acute visit in December endorsing persistent dizziness.  Orthostasis suspected and HCTZ was reduced to 12.5 mg daily.  There have otherwise been no acute interval events. Today she continues to endorse dizziness and fatigue. She additionally endorses neuropathic pain in her feet that is worst at night. Anxiety has improved.   Past Medical History:  Diagnosis Date   Arthritis    Shoulder   Compression fracture of L4 lumbar vertebra    Pt did not have pain, osteoporosis   COPD (chronic obstructive pulmonary disease) (HCC) 03/02/2008   PFT   Hepatic cyst    Hypertension    Osteoporosis    Thrombophlebitis of superficial veins of left lower extremity 02/05/2022   Past Surgical History:  Procedure Laterality Date   ABDOMINAL HYSTERECTOMY     CATARACT EXTRACTION W/PHACO Right 08/23/2014   Procedure: CATARACT EXTRACTION PHACO AND INTRAOCULAR LENS PLACEMENT RIGHT EYE; CDE:  10.80;  Surgeon: Gemma Payor, MD;  Location: [facility];  Service: Ophthalmology;  Laterality: Right;   CATARACT EXTRACTION W/PHACO Left 10/08/2014   Procedure: CATARACT EXTRACTION PHACO AND INTRAOCULAR LENS PLACEMENT (IOC);  Surgeon: Gemma Payor, MD;  Location: [facility];  Service: Ophthalmology;  Laterality: Left;  CDE 6.60   COLONOSCOPY WITH PROPOFOL N/A 09/04/2022   15 mm polyp removed from sigmoid, two 3-4 mm polyps removed from rectum/sigmoid colon.  Pathology with tubular adenomas.   POLYPECTOMY   09/04/2022   Procedure: POLYPECTOMY;  Surgeon: Corbin Ade, MD;  Location: [facility];  Service: Endoscopy;;  hot snare   TOTAL HIP ARTHROPLASTY Left 01/07/2022   Procedure: LEFT TOTAL HIP ARTHROPLASTY ANTERIOR APPROACH;  Surgeon: Eldred Manges, MD;  Location: [facility];  Service: Orthopedics;  Laterality: Left;   Social History   Tobacco Use   Smoking status: Former    Types: Cigarettes   Smokeless tobacco: Never   Tobacco comments:    She quit smoking in January 2022. Smoked for 20 years, a pack usually last her a week.  Vaping Use   Vaping status: Never Used  Substance Use Topics   Alcohol use: No   Drug use: No   Family History  Problem Relation Age of Onset   Diabetes Mother    Heart disease Father    Hypertension Sister    Hypertension Sister    Hypertension Sister    Hypertension Brother    Diabetes Mellitus II Son    Arthritis Other    Breast cancer Neg Hx    Colon cancer Neg Hx    Cancer - Lung Neg Hx    No Known Allergies  Review of Systems  Constitutional:  Positive for malaise/fatigue.  Neurological:  Positive for dizziness and tingling (burning sensation in feet worse at night).     Objective:     BP 137/74 (BP Location: Right Arm, Patient Position: Sitting, Cuff Size: Normal)   Pulse 65   Ht 5\' 4"  (  1.626 m)   Wt 161 lb 3.2 oz (73.1 kg)   SpO2 90%   BMI 27.67 kg/m  BP Readings from Last 3 Encounters:  03/25/23 137/74  02/15/23 108/66  12/23/22 111/67   Physical Exam Vitals reviewed.  Constitutional:      General: She is not in acute distress.    Appearance: Normal appearance. She is not toxic-appearing.  HENT:     Head: Normocephalic and atraumatic.     Right Ear: External ear normal.     Left Ear: External ear normal.     Nose: Nose normal. No congestion or rhinorrhea.     Mouth/Throat:     Mouth: Mucous membranes are moist.     Pharynx: Oropharynx is clear. No oropharyngeal exudate or posterior oropharyngeal erythema.  Eyes:      General: No scleral icterus.    Extraocular Movements: Extraocular movements intact.     Conjunctiva/sclera: Conjunctivae normal.     Pupils: Pupils are equal, round, and reactive to light.  Cardiovascular:     Rate and Rhythm: Normal rate and regular rhythm.     Pulses: Normal pulses.     Heart sounds: Normal heart sounds. No murmur heard.    No friction rub. No gallop.  Pulmonary:     Effort: Pulmonary effort is normal.     Breath sounds: Normal breath sounds. No wheezing, rhonchi or rales.  Abdominal:     General: Abdomen is flat. Bowel sounds are normal. There is no distension.     Palpations: Abdomen is soft.     Tenderness: There is no abdominal tenderness.  Musculoskeletal:        General: No swelling. Normal range of motion.     Cervical back: Normal range of motion.     Right lower leg: No edema.     Left lower leg: No edema.  Lymphadenopathy:     Cervical: No cervical adenopathy.  Skin:    General: Skin is warm and dry.     Capillary Refill: Capillary refill takes less than 2 seconds.     Coloration: Skin is not jaundiced.  Neurological:     General: No focal deficit present.     Mental Status: She is alert and oriented to person, place, and time.  Psychiatric:        Mood and Affect: Mood normal.        Behavior: Behavior normal.   Last CBC Lab Results  Component Value Date   WBC 9.1 12/23/2022   HGB 13.9 12/23/2022   HCT 44.6 12/23/2022   MCV 92 12/23/2022   MCH 28.7 12/23/2022   RDW 12.9 12/23/2022   PLT 218 12/23/2022   Last metabolic panel Lab Results  Component Value Date   GLUCOSE 87 12/23/2022   NA 139 12/23/2022   K 3.9 12/23/2022   CL 99 12/23/2022   CO2 25 12/23/2022   BUN 11 12/23/2022   CREATININE 0.67 12/23/2022   EGFR 87 12/23/2022   CALCIUM 9.5 12/23/2022   PROT 6.5 12/23/2022   ALBUMIN 3.9 12/23/2022   LABGLOB 2.6 12/23/2022   AGRATIO 1.7 07/01/2021   BILITOT 0.4 12/23/2022   ALKPHOS 76 12/23/2022   AST 17 12/23/2022   ALT 7  12/23/2022   ANIONGAP 5 09/04/2022   Last lipids Lab Results  Component Value Date   CHOL 167 07/01/2021   HDL 56 07/01/2021   LDLCALC 96 07/01/2021   TRIG 77 07/01/2021   CHOLHDL 3.0 07/01/2021   Last hemoglobin A1c  Lab Results  Component Value Date   HGBA1C 5.6 07/01/2021   Last thyroid functions Lab Results  Component Value Date   TSH 1.280 12/23/2022   Last vitamin D Lab Results  Component Value Date   VD25OH 37.0 12/23/2022   Last vitamin B12 and Folate Lab Results  Component Value Date   VITAMINB12 388 12/23/2022   FOLATE 5.9 12/23/2022     Assessment & Plan:   Problem List Items Addressed This Visit       Essential hypertension, benign   Remains adequately controlled on current antihypertensive regimen.  HCTZ was reduced to 12.5 mg daily at her last appointment in case low-normal BP readings were contributing to symptoms of dizziness.  She continues to take amlodipine 10 mg daily.  No further changes are indicated today.      COPD, mild (HCC)   Asymptomatic currently.  Pulmonary exam is unremarkable.  She continues to use albuterol as needed.      Peripheral polyneuropathy   She endorses poorly controlled neuropathic pain in her feet.  Symptoms are worse at night.  She is currently prescribed gabapentin 300 mg 3 times daily.  Recommend increasing her evening dose to 600 mg.      Fatigue   Chronic issue.  Symptoms today are essentially unchanged.  She denies additional symptoms.  Labs updated in October were potentially suggestive of iron deficiency.  Will repeat iron studies today and plan to start oral iron supplementation if again suggestive of iron deficiency.      Dizziness   Symptoms are unchanged despite reducing her antihypertensive regimen.  She cannot identify any triggers for her symptoms.  Symptoms previously not alleviated with meclizine.  She believes it may be related to her eyes and is scheduled to see optometry next week.      Return  in about 3 months (around 06/23/2023).   Billie Lade, MD

## 2023-03-25 NOTE — Assessment & Plan Note (Signed)
Chronic issue.  Symptoms today are essentially unchanged.  She denies additional symptoms.  Labs updated in October were potentially suggestive of iron deficiency.  Will repeat iron studies today and plan to start oral iron supplementation if again suggestive of iron deficiency.

## 2023-03-26 LAB — IRON,TIBC AND FERRITIN PANEL
Ferritin: 89 ng/mL (ref 15–150)
Iron Saturation: 18 % (ref 15–55)
Iron: 60 ug/dL (ref 27–139)
Total Iron Binding Capacity: 331 ug/dL (ref 250–450)
UIBC: 271 ug/dL (ref 118–369)

## 2023-03-26 LAB — CBC WITH DIFFERENTIAL/PLATELET
Basophils Absolute: 0.1 10*3/uL (ref 0.0–0.2)
Basos: 1 %
EOS (ABSOLUTE): 0.1 10*3/uL (ref 0.0–0.4)
Eos: 1 %
Hematocrit: 40.7 % (ref 34.0–46.6)
Hemoglobin: 13.4 g/dL (ref 11.1–15.9)
Immature Grans (Abs): 0 10*3/uL (ref 0.0–0.1)
Immature Granulocytes: 0 %
Lymphocytes Absolute: 1.8 10*3/uL (ref 0.7–3.1)
Lymphs: 24 %
MCH: 30.4 pg (ref 26.6–33.0)
MCHC: 32.9 g/dL (ref 31.5–35.7)
MCV: 92 fL (ref 79–97)
Monocytes Absolute: 0.7 10*3/uL (ref 0.1–0.9)
Monocytes: 9 %
Neutrophils Absolute: 4.9 10*3/uL (ref 1.4–7.0)
Neutrophils: 65 %
Platelets: 212 10*3/uL (ref 150–450)
RBC: 4.41 x10E6/uL (ref 3.77–5.28)
RDW: 13.7 % (ref 11.7–15.4)
WBC: 7.6 10*3/uL (ref 3.4–10.8)

## 2023-05-12 ENCOUNTER — Telehealth: Payer: Self-pay | Admitting: Internal Medicine

## 2023-05-12 NOTE — Telephone Encounter (Unsigned)
 Copied from CRM [id number]. Topic: Clinical - Medical Advice >> May 12, 2023 12:00 PM Alexis Davis wrote: Reason for CRM: [employer] test was abnormal; left foot 0.75 showing mild changes, right foot 0.86 also showing slight changes. Patient is stable. Call patient directly if any follow up is needed.

## 2023-06-24 ENCOUNTER — Ambulatory Visit

## 2023-06-24 ENCOUNTER — Ambulatory Visit: Payer: Medicare Other | Admitting: Internal Medicine

## 2023-06-24 VITALS — BP 133/79 | HR 64 | Ht 64.0 in | Wt 157.0 lb

## 2023-06-24 DIAGNOSIS — M810 Age-related osteoporosis without current pathological fracture: Secondary | ICD-10-CM

## 2023-06-24 DIAGNOSIS — G629 Polyneuropathy, unspecified: Secondary | ICD-10-CM

## 2023-06-24 MED ORDER — ALENDRONATE SODIUM 70 MG PO TABS
70.0000 mg | ORAL_TABLET | ORAL | 11 refills | Status: DC
Start: 2023-06-24 — End: 2023-09-22

## 2023-06-24 NOTE — Patient Instructions (Signed)
 Recommend adding low dose enteric coated aspirin  81 mg daily for leg pain.  Recommend f/u in 3 months for routine follow-up or sooner if symptoms are not improving.

## 2023-06-24 NOTE — Progress Notes (Unsigned)
 Established Patient Office Visit  Subjective   Patient ID: Alexis Davis, female    DOB: November 22, 1940  Age: 83 y.o. MRN: [id number]  Chief Complaint  Patient presents with   Medical Management of Chronic Issues    Pt here for 3 month follow up   Home health [employer] screening for circulation in her lower legs showed Mild/moderate disease in bilat legs.  She denies improvement in neuropathy or foot pain since increasing the dose of gabapentin  to 600 mg.   HPI  Past Medical History:  Diagnosis Date   Arthritis    Shoulder   Compression fracture of L4 lumbar vertebra    Pt did not have pain, osteoporosis   COPD (chronic obstructive pulmonary disease) (HCC) 03/02/2008   PFT   Hepatic cyst    Hypertension    Osteoporosis    Thrombophlebitis of superficial veins of left lower extremity 02/05/2022   Past Surgical History:  Procedure Laterality Date   ABDOMINAL HYSTERECTOMY     CATARACT EXTRACTION W/PHACO Right 08/23/2014   Procedure: CATARACT EXTRACTION PHACO AND INTRAOCULAR LENS PLACEMENT RIGHT EYE; CDE:  10.80;  Surgeon: Anner Kill, MD;  Location: [facility];  Service: Ophthalmology;  Laterality: Right;   CATARACT EXTRACTION W/PHACO Left 10/08/2014   Procedure: CATARACT EXTRACTION PHACO AND INTRAOCULAR LENS PLACEMENT (IOC);  Surgeon: Anner Kill, MD;  Location: [facility];  Service: Ophthalmology;  Laterality: Left;  CDE 6.60   COLONOSCOPY WITH PROPOFOL  N/A 09/04/2022   15 mm polyp removed from sigmoid, two 3-4 mm polyps removed from rectum/sigmoid colon.  Pathology with tubular adenomas.   POLYPECTOMY  09/04/2022   Procedure: POLYPECTOMY;  Surgeon: Suzette Espy, MD;  Location: [facility];  Service: Endoscopy;;  hot snare   TOTAL HIP ARTHROPLASTY Left 01/07/2022   Procedure: LEFT TOTAL HIP ARTHROPLASTY ANTERIOR APPROACH;  Surgeon: Adah Acron, MD;  Location: [facility];  Service: Orthopedics;  Laterality: Left;      ROS    Objective:     BP 133/79   Pulse 64   Ht 5'  4" (1.626 m)   Wt 157 lb 0.6 oz (71.2 kg)   SpO2 94%   BMI 26.96 kg/m  BP Readings from Last 3 Encounters:  06/24/23 133/79  03/25/23 137/74  02/15/23 108/66      Physical Exam Vitals and nursing note reviewed.  Constitutional:      Appearance: Normal appearance.  Eyes:     Extraocular Movements: Extraocular movements intact.     Pupils: Pupils are equal, round, and reactive to light.  Neurological:     Mental Status: She is alert and oriented to person, place, and time.  Psychiatric:        Mood and Affect: Mood normal.        Thought Content: Thought content normal.      No results found for any visits on 06/24/23.  Last CBC Lab Results  Component Value Date   WBC 7.6 03/25/2023   HGB 13.4 03/25/2023   HCT 40.7 03/25/2023   MCV 92 03/25/2023   MCH 30.4 03/25/2023   RDW 13.7 03/25/2023   PLT 212 03/25/2023   Last metabolic panel Lab Results  Component Value Date   GLUCOSE 87 12/23/2022   NA 139 12/23/2022   K 3.9 12/23/2022   CL 99 12/23/2022   CO2 25 12/23/2022   BUN 11 12/23/2022   CREATININE 0.67 12/23/2022   EGFR 87 12/23/2022   CALCIUM  9.5 12/23/2022   PROT 6.5 12/23/2022  ALBUMIN 3.9 12/23/2022   LABGLOB 2.6 12/23/2022   AGRATIO 1.7 07/01/2021   BILITOT 0.4 12/23/2022   ALKPHOS 76 12/23/2022   AST 17 12/23/2022   ALT 7 12/23/2022   ANIONGAP 5 09/04/2022   Last hemoglobin A1c Lab Results  Component Value Date   HGBA1C 5.6 07/01/2021   Last thyroid  functions Lab Results  Component Value Date   TSH 1.280 12/23/2022   Last vitamin D  Lab Results  Component Value Date   VD25OH 37.0 12/23/2022   Last vitamin B12 and Folate Lab Results  Component Value Date   VITAMINB12 388 12/23/2022   FOLATE 5.9 12/23/2022      The ASCVD Risk score (Arnett DK, et al., 2019) failed to calculate for the following reasons:   The 2019 ASCVD risk score is only valid for ages 74 to 47    Assessment & Plan:   Problem List Items Addressed This  Visit       Nervous and Auditory   Peripheral polyneuropathy   She denies improvement of neuropathy symptoms with increase of gabapentin  dose to 600 at bedtime.  Recommend continuing with this dose for now.  Recommend adding a topical magnesium  spray  at bedtime or soaking her feet in Epsom salt for symptom relief.          Musculoskeletal and Integument   Osteoporosis - Primary   Update prescription for Fosamax .  She had bone density done last year.      Relevant Medications   alendronate  (FOSAMAX ) 70 MG tablet    Return in about 3 months (around 09/23/2023).    Alison Irvine, FNP

## 2023-06-25 NOTE — Assessment & Plan Note (Signed)
 She denies improvement of neuropathy symptoms with increase of gabapentin  dose to 600 at bedtime.  Recommend continuing with this dose for now.  Recommend adding a topical magnesium  spray  at bedtime or soaking her feet in Epsom salt for symptom relief.

## 2023-06-25 NOTE — Assessment & Plan Note (Signed)
 Update prescription for Fosamax .  She had bone density done last year.

## 2023-06-29 ENCOUNTER — Ambulatory Visit (INDEPENDENT_AMBULATORY_CARE_PROVIDER_SITE_OTHER): Payer: Medicare Other

## 2023-06-29 VITALS — Ht 64.0 in | Wt 157.0 lb

## 2023-06-29 DIAGNOSIS — Z0100 Encounter for examination of eyes and vision without abnormal findings: Secondary | ICD-10-CM

## 2023-06-29 DIAGNOSIS — Z0000 Encounter for general adult medical examination without abnormal findings: Secondary | ICD-10-CM | POA: Diagnosis not present

## 2023-06-29 NOTE — Patient Instructions (Signed)
 Alexis Davis , Thank you for taking time to come for your Medicare Wellness Visit. I appreciate your ongoing commitment to your health goals. Please review the following plan we discussed and let me know if I can assist you in the future.   Referrals/Orders/Follow-Ups/Clinician Recommendations:    Next Medicare AWV: Jul 03, 2024 at 8:00 am telephone visit      You've been referred to [facility]. You can call them to schedule your appointment Address: [address], [place], [place] [address] Phone: [phone]      Aim for 30 minutes of exercise or brisk walking, 6-8 glasses of water, and 5 servings of fruits and vegetables each day.   This is a list of the screening recommended for you and due dates:  Health Maintenance  Topic Date Due   Flu Shot  10/01/2023   DEXA scan (bone density measurement)  06/28/2024   Medicare Annual Wellness Visit  06/28/2024   Colon Cancer Screening  09/03/2025   Pneumonia Vaccine  Completed   HPV Vaccine  Aged Out   Meningitis B Vaccine  Aged Out   DTaP/Tdap/Td vaccine  Discontinued   COVID-19 Vaccine  Discontinued   Zoster (Shingles) Vaccine  Discontinued    Advanced directives: (Declined) Advance directive discussed with you today. Even though you declined this today, please call our office should you change your mind, and we can give you the proper paperwork for you to fill out. Advance Care Planning is important because it:  [x]  Makes sure you receive the medical care that is consistent with your values, goals, and preferences  [x]  It provides guidance to your family and loved ones and it also reduces their decisional burden about whether or not they are making the right decisions based on what you want done  Follow the link provided in your after visit summary or read over the paperwork we have mailed to you to help you started getting your Advance Directives in place. If you need assistance in completing these, please reach out to  us  so that we can help you!  Next Medicare Annual Wellness Visit scheduled for next year: yes  Understanding Your Risk for Falls Millions of people have serious injuries from falls each year. It is important to understand your risk of falling. Talk with your health care provider about your risk and what you can do to lower it. If you do have a serious fall, make sure to tell your provider. Falling once raises your risk of falling again. How can falls affect me? Serious injuries from falls are common. These include: Broken bones, such as hip fractures. Head injuries, such as traumatic brain injuries (TBI) or concussions. A fear of falling can cause you to avoid activities and stay at home. This can make your muscles weaker and raise your risk for a fall. What can increase my risk? There are a number of risk factors that increase your risk for falling. The more risk factors you have, the higher your risk of falling. Serious injuries from a fall happen most often to people who are older than 84 years old. Teenagers and young adults ages 43-29 are also at higher risk. Common risk factors include: Weakness in the lower body. Being generally weak or confused due to long-term (chronic) illness. Dizziness or balance problems. Poor vision. Medicines that cause dizziness or drowsiness. These may include: Medicines for your blood pressure, heart, anxiety, insomnia, or swelling (edema). Pain medicines. Muscle relaxants. Other risk factors  include: Drinking alcohol. Having had a fall in the past. Having foot pain or wearing improper footwear. Working at a dangerous job. Having any of the following in your home: Tripping hazards, such as floor clutter or loose rugs. Poor lighting. Pets. Having dementia or memory loss. What actions can I take to lower my risk of falling?  Physical activity Stay physically fit. Do strength and balance exercises. Consider taking a regular class to build strength  and balance. Yoga and tai chi are good options. Vision Have your eyes checked every year and your prescription for glasses or contacts updated as needed. Shoes and walking aids Wear non-skid shoes. Wear shoes that have rubber soles and low heels. Do not wear high heels. Do not walk around the house in socks or slippers. Use a cane or walker as told by your provider. Home safety Attach secure railings on both sides of your stairs. Install grab bars for your bathtub, shower, and toilet. Use a non-skid mat in your bathtub or shower. Attach bath mats securely with double-sided, non-slip rug tape. Use good lighting in all rooms. Keep a flashlight near your bed. Make sure there is a clear path from your bed to the bathroom. Use night-lights. Do not use throw rugs. Make sure all carpeting is taped or tacked down securely. Remove all clutter from walkways and stairways, including extension cords. Repair uneven or broken steps and floors. Avoid walking on icy or slippery surfaces. Walk on the grass instead of on icy or slick sidewalks. Use ice melter to get rid of ice on walkways in the winter. Use a cordless phone. Questions to ask your health care provider Can you help me check my risk for a fall? Do any of my medicines make me more likely to fall? Should I take a vitamin D  supplement? What exercises can I do to improve my strength and balance? Should I make an appointment to have my vision checked? Do I need a bone density test to check for weak bones (osteoporosis)? Would it help to use a cane or a walker? Where to find more information Centers for Disease Control and Prevention, [employer]: TonerPromos.no Community-Based Fall Prevention Programs: TonerPromos.no [employer] on Aging: BaseRingTones.pl Contact a health care provider if: You fall at home. You are afraid of falling at home. You feel weak, drowsy, or dizzy. This information is not intended to replace advice given to you by your health care  provider. Make sure you discuss any questions you have with your health care provider. Document Revised: 10/20/2021 Document Reviewed: 10/20/2021 Elsevier Patient Education  2024 [employer].

## 2023-06-29 NOTE — Progress Notes (Signed)
 Subjective:   Alexis Davis is a 83 y.o. who presents for a Medicare Wellness preventive visit.  Visit Complete: Virtual I connected with  Natalie Bailey on 06/29/23 by a audio enabled telemedicine application and verified that I am speaking with the correct person using two identifiers.  Patient Location: Home  Provider Location: Home Office  I discussed the limitations of evaluation and management by telemedicine. The patient expressed understanding and agreed to proceed.  Vital Signs: Because this visit was a virtual/telehealth visit, some criteria may be missing or patient reported. Any vitals not documented were not able to be obtained and vitals that have been documented are patient reported.  VideoDeclined- This patient declined Librarian, academic. Therefore the visit was completed with audio only.  Persons Participating in Visit: Patient.  AWV Questionnaire: No: Patient Medicare AWV questionnaire was not completed prior to this visit.  Cardiac Risk Factors include: advanced age (>57men, >72 women);hypertension     Objective:    Today's Vitals   06/29/23 0846  Weight: 157 lb (71.2 kg)  Height: 5\' 4"  (1.626 m)  PainSc: 0-No pain   Body mass index is 26.95 kg/m.     06/29/2023    9:11 AM 09/04/2022    2:49 PM 09/02/2022    9:00 PM 09/02/2022   12:30 PM 01/07/2022   10:05 AM 12/31/2021    9:55 AM 05/19/2021    3:39 PM  Advanced Directives  Does Patient Have a Medical Advance Directive? No Yes;No Yes Yes Yes Yes No  Type of Advance Directive  Living will Living will Living will Living will Living will   Does patient want to make changes to medical advance directive?   No - Patient declined  No - Patient declined No - Patient declined   Would patient like information on creating a medical advance directive? No - Patient declined      Yes (ED - Information included in AVS)    Current Medications (verified) Outpatient Encounter  Medications as of 06/29/2023  Medication Sig   albuterol  (VENTOLIN  HFA) 108 (90 Base) MCG/ACT inhaler Inhale 2 puffs into the lungs every 4 (four) hours as needed for wheezing or shortness of breath.   alendronate  (FOSAMAX ) 70 MG tablet Take 1 tablet (70 mg total) by mouth every 7 (seven) days. Take with a full glass of water on an empty stomach.   amLODipine  (NORVASC ) 10 MG tablet Take 1 tablet (10 mg total) by mouth daily.   Calcium  Carbonate-Vitamin D  (CALTRATE 600+D PO) Take 1 tablet by mouth 2 (two) times daily.   Cholecalciferol  (VITAMIN D3) 25 MCG (1000 UT) CAPS Take 1 capsule (1,000 Units total) by mouth daily.   COD LIVER OIL PO Take 1 capsule by mouth daily.   gabapentin  (NEURONTIN ) 300 MG capsule Take 1 capsule (300 mg total) by mouth 3 (three) times daily.   guaiFENesin  (ROBITUSSIN) 100 MG/5ML liquid Take 5 mLs by mouth every 4 (four) hours as needed for cough or to loosen phlegm.   hydrochlorothiazide  (HYDRODIURIL ) 25 MG tablet Take 1 tablet (25 mg total) by mouth daily. (Patient taking differently: Take 12.5 mg by mouth daily.)   hydrOXYzine  (ATARAX ) 10 MG tablet Take 1 tablet (10 mg total) by mouth every 12 (twelve) hours as needed for anxiety.   meclizine  (ANTIVERT ) 25 MG tablet Take 1 tablet (25 mg total) by mouth 3 (three) times daily as needed for dizziness.   pantoprazole  (PROTONIX ) 40 MG tablet Take 1 tablet (40 mg  total) by mouth daily.   No facility-administered encounter medications on file as of 06/29/2023.    Allergies (verified) Patient has no known allergies.   History: Past Medical History:  Diagnosis Date   Arthritis    Shoulder   Compression fracture of L4 lumbar vertebra    Pt did not have pain, osteoporosis   COPD (chronic obstructive pulmonary disease) (HCC) 03/02/2008   PFT   Hepatic cyst    Hypertension    Osteoporosis    Thrombophlebitis of superficial veins of left lower extremity 02/05/2022   Past Surgical History:  Procedure Laterality Date    ABDOMINAL HYSTERECTOMY     CATARACT EXTRACTION W/PHACO Right 08/23/2014   Procedure: CATARACT EXTRACTION PHACO AND INTRAOCULAR LENS PLACEMENT RIGHT EYE; CDE:  10.80;  Surgeon: Anner Kill, MD;  Location: [facility];  Service: Ophthalmology;  Laterality: Right;   CATARACT EXTRACTION W/PHACO Left 10/08/2014   Procedure: CATARACT EXTRACTION PHACO AND INTRAOCULAR LENS PLACEMENT (IOC);  Surgeon: Anner Kill, MD;  Location: [facility];  Service: Ophthalmology;  Laterality: Left;  CDE 6.60   COLONOSCOPY WITH PROPOFOL  N/A 09/04/2022   15 mm polyp removed from sigmoid, two 3-4 mm polyps removed from rectum/sigmoid colon.  Pathology with tubular adenomas.   POLYPECTOMY  09/04/2022   Procedure: POLYPECTOMY;  Surgeon: Suzette Espy, MD;  Location: [facility];  Service: Endoscopy;;  hot snare   TOTAL HIP ARTHROPLASTY Left 01/07/2022   Procedure: LEFT TOTAL HIP ARTHROPLASTY ANTERIOR APPROACH;  Surgeon: Adah Acron, MD;  Location: [facility] OR;  Service: Orthopedics;  Laterality: Left;   Family History  Problem Relation Age of Onset   Diabetes Mother    Heart disease Father    Hypertension Sister    Hypertension Sister    Hypertension Sister    Hypertension Brother    Diabetes Mellitus II Son    Arthritis Other    Breast cancer Neg Hx    Colon cancer Neg Hx    Cancer - Lung Neg Hx    Social History   Socioeconomic History   Marital status: Married    Spouse name: Drexel Gentles   Number of children: 3   Years of education: 12   Highest education level: Not on file  Occupational History   Not on file  Tobacco Use   Smoking status: Former    Types: Cigarettes   Smokeless tobacco: Never   Tobacco comments:    She quit smoking in January 2022. Smoked for 20 years, a pack usually last her a week.  Vaping Use   Vaping status: Never Used  Substance and Sexual Activity   Alcohol use: No   Drug use: No   Sexual activity: Yes  Other Topics Concern   Not on file  Social History Narrative   Lives at home with  her husband, has 3 children, pt is retired.      Social Drivers of Corporate investment banker Strain: Low Risk  (06/29/2023)   Overall Financial Resource Strain (CARDIA)    Difficulty of Paying Living Expenses: Not hard at all  Food Insecurity: No Food Insecurity (06/29/2023)   Hunger Vital Sign    Worried About Running Out of Food in the Last Year: Never true    Ran Out of Food in the Last Year: Never true  Transportation Needs: No Transportation Needs (06/29/2023)   PRAPARE - Administrator, Civil Service (Medical): No    Lack of Transportation (Non-Medical): No  Physical Activity: Sufficiently Active (  06/29/2023)   Exercise Vital Sign    Days of Exercise per Week: 7 days    Minutes of Exercise per Session: 30 min  Stress: No Stress Concern Present (06/29/2023)   [employer] of Occupational Health - Occupational Stress Questionnaire    Feeling of Stress : Not at all  Social Connections: Moderately Integrated (06/29/2023)   Social Connection and Isolation Panel [NHANES]    Frequency of Communication with Friends and Family: More than three times a week    Frequency of Social Gatherings with Friends and Family: More than three times a week    Attends Religious Services: More than 4 times per year    Active Member of [employer] or Organizations: Yes    Attends Banker Meetings: More than 4 times per year    Marital Status: Widowed    Tobacco Counseling Counseling given: Yes Tobacco comments: She quit smoking in January 2022. Smoked for 20 years, a pack usually last her a week.    Clinical Intake:  Pre-visit preparation completed: Yes  Pain : No/denies pain Pain Score: 0-No pain     BMI - recorded: 26.95 Nutritional Status: BMI 25 -29 Overweight Nutritional Risks: None Diabetes: No  Lab Results  Component Value Date   HGBA1C 5.6 07/01/2021     How often do you need to have someone help you when you read instructions, pamphlets, or other  written materials from your doctor or pharmacy?: 1 - Never  Interpreter Needed?: No  Information entered by :: Sally Crazier CMA   Activities of Daily Living     06/29/2023    8:56 AM 09/02/2022    9:00 PM  In your present state of health, do you have any difficulty performing the following activities:  Hearing? 0 0  Vision? 0 0  Difficulty concentrating or making decisions? 0 0  Walking or climbing stairs? 0 0  Dressing or bathing? 0 0  Doing errands, shopping? 0 0  Preparing Food and eating ? N   Using the Toilet? N   In the past six months, have you accidently leaked urine? N   Do you have problems with loss of bowel control? N   Managing your Medications? N   Managing your Finances? N   Housekeeping or managing your Housekeeping? N     Patient Care Team: Tobi Fortes, MD as PCP - General (Internal Medicine) Bridgette Campus, MD as PCP - Cardiology (Cardiology) Myrle Aspen, [facility] (Inactive) as Pharmacist (Pharmacist)  Indicate any recent Medical Services you may have received from other than [facility] providers in the past year (date may be approximate).     Assessment:   This is a routine wellness examination for [facility].  Hearing/Vision screen Hearing Screening - Comments:: Patient denies any hearing difficulties.   Vision Screening - Comments:: Patient is not up to date on yearly eye exams.  Referral to ophthalmology placed.     Goals Addressed             This Visit's Progress    Patient Stated       To remain active and healthy       Depression Screen     06/29/2023    9:12 AM 06/24/2023   10:49 AM 03/25/2023   10:00 AM 12/23/2022   12:57 PM 10/28/2022    1:51 PM 09/22/2022    9:14 AM 06/24/2022   10:15 AM  PHQ 2/9 Scores  PHQ - 2 Score 0 0 0 0  2 0 0  PHQ- 9 Score 0 0  0 6 0     Fall Risk     06/29/2023    8:57 AM 06/24/2023   10:49 AM 03/25/2023   10:00 AM 02/15/2023    3:11 PM 12/23/2022   12:57 PM  Fall Risk   Falls in the past year? 0 0 0  0 0  Number falls in past yr: 0  0 0 0  Injury with Fall? 0  0 0 0  Risk for fall due to : No Fall Risks No Fall Risks No Fall Risks No Fall Risks No Fall Risks  Follow up Falls prevention discussed;Falls evaluation completed;Education provided Falls evaluation completed Falls evaluation completed Falls evaluation completed Falls evaluation completed    MEDICARE RISK AT HOME:  Medicare Risk at Home Any stairs in or around the home?: Yes If so, are there any without handrails?: No Home free of loose throw rugs in walkways, pet beds, electrical cords, etc?: Yes Adequate lighting in your home to reduce risk of falls?: Yes Life alert?: No Use of a cane, walker or w/c?: No Grab bars in the bathroom?: Yes Shower chair or bench in shower?: Yes Elevated toilet seat or a handicapped toilet?: No  TIMED UP AND GO:  Was the test performed?  No  Cognitive Function: Declined/Normal: No cognitive concerns noted by patient or family. Patient alert, oriented, able to answer questions appropriately and recall recent events. No signs of memory loss or confusion.        06/29/2023    9:06 AM 06/24/2022   10:15 AM 05/19/2021    3:45 PM  6CIT Screen  What Year? 0 points 0 points 0 points  What month? 0 points 0 points 0 points  What time? 0 points 0 points 0 points  Count back from 20 0 points 2 points 0 points  Months in reverse 0 points 0 points 2 points  Repeat phrase 0 points 0 points 2 points  Total Score 0 points 2 points 4 points    Immunizations Immunization History  Administered Date(s) Administered   Influenza,inj,Quad PF,6+ Mos 12/09/2012   Moderna SARS-COV2 Booster Vaccination 01/30/2020   Moderna Sars-Covid-2 Vaccination 04/28/2019, 05/26/2019   Pneumococcal Conjugate-13 06/09/2013   Pneumococcal Polysaccharide-23 04/23/2005    Screening Tests Health Maintenance  Topic Date Due   INFLUENZA VACCINE  10/01/2023   DEXA SCAN  06/28/2024   Medicare Annual Wellness (AWV)   06/28/2024   Pneumonia Vaccine 12+ Years old  Completed   HPV VACCINES  Aged Out   Meningococcal B Vaccine  Aged Out   DTaP/Tdap/Td  Discontinued   COVID-19 Vaccine  Discontinued   Zoster Vaccines- Shingrix  Discontinued    Health Maintenance  There are no preventive care reminders to display for this patient. Health Maintenance Items Addressed: Referral sent to Optometry/Ophthalmology  Additional Screening:  Vision Screening: Recommended annual ophthalmology exams for early detection of glaucoma and other disorders of the eye. Referral placed to [facility] in [place] Dental Screening: Recommended annual dental exams for proper oral hygiene  Community Resource Referral / Chronic Care Management: CRR required this visit?  No   CCM required this visit?  No     Plan:     I have personally reviewed and noted the following in the patient's chart:   Medical and social history Use of alcohol, tobacco or illicit drugs  Current medications and supplements including opioid prescriptions. Patient is not currently taking opioid prescriptions. Functional  ability and status Nutritional status Physical activity Advanced directives List of other physicians Hospitalizations, surgeries, and ER visits in previous 12 months Vitals Screenings to include cognitive, depression, and falls Referrals and appointments  In addition, I have reviewed and discussed with patient certain preventive protocols, quality metrics, and best practice recommendations. A written personalized care plan for preventive services as well as general preventive health recommendations were provided to patient.      Serinity Ware, CMA   06/29/2023   After Visit Summary: (Mail) Due to this being a telephonic visit, the after visit summary with patients personalized plan was offered to patient via mail   Notes: Nothing significant to report at this time.

## 2023-08-20 ENCOUNTER — Other Ambulatory Visit: Payer: Self-pay | Admitting: Internal Medicine

## 2023-08-20 DIAGNOSIS — I10 Essential (primary) hypertension: Secondary | ICD-10-CM

## 2023-09-22 ENCOUNTER — Ambulatory Visit (INDEPENDENT_AMBULATORY_CARE_PROVIDER_SITE_OTHER)

## 2023-09-22 VITALS — BP 123/73 | HR 75 | Ht 64.0 in | Wt 154.1 lb

## 2023-09-22 DIAGNOSIS — M810 Age-related osteoporosis without current pathological fracture: Secondary | ICD-10-CM

## 2023-09-22 DIAGNOSIS — E559 Vitamin D deficiency, unspecified: Secondary | ICD-10-CM

## 2023-09-22 DIAGNOSIS — G5790 Unspecified mononeuropathy of unspecified lower limb: Secondary | ICD-10-CM

## 2023-09-22 DIAGNOSIS — R5383 Other fatigue: Secondary | ICD-10-CM

## 2023-09-22 DIAGNOSIS — I10 Essential (primary) hypertension: Secondary | ICD-10-CM

## 2023-09-22 DIAGNOSIS — E611 Iron deficiency: Secondary | ICD-10-CM

## 2023-09-22 MED ORDER — ALENDRONATE SODIUM 70 MG PO TABS
70.0000 mg | ORAL_TABLET | ORAL | 11 refills | Status: AC
Start: 2023-09-22 — End: ?

## 2023-09-22 MED ORDER — AMLODIPINE BESYLATE 10 MG PO TABS: 10.0000 mg | ORAL_TABLET | Freq: Every day | ORAL | 0 refills | Status: AC

## 2023-09-22 NOTE — Progress Notes (Signed)
 Established Patient Office Visit  Subjective   Patient ID: Alexis Davis, female    DOB: [birth year]  Age: 83 y.o. MRN: [id number]  Chief Complaint  Patient presents with   Medical Management of Chronic Issues    3 month follow up    HPI  Patient Active Problem List   Diagnosis Date Noted   Iron deficiency 10/01/2023   GAD (generalized anxiety disorder) 02/15/2023   Grief reaction 02/15/2023   Fatigue 12/23/2022   Dizziness 12/23/2022   Cough 10/28/2022   Acute lower GI bleeding 09/04/2022   Rectal bleeding 09/02/2022   PRB (rectal bleeding) 09/02/2022   S/P total left hip arthroplasty 01/29/2022   Annual physical exam 07/01/2021   Seasonal allergies 07/19/2019   Peripheral polyneuropathy 11/24/2017   Vitamin D  deficiency 06/22/2016   Varicose veins 03/16/2014   OA (osteoarthritis) 11/03/2013   Osteoporosis 12/09/2012   PTTD (posterior tibial tendon dysfunction) 09/01/2012   Neuropathy, leg 04/29/2012   Rotator cuff syndrome of right shoulder 11/26/2011   COPD, mild (HCC) 10/22/2008   Essential hypertension, benign 09/25/2008   Osteoarthritis of shoulder 06/12/2008   HEPATIC CYST 03/16/2006   DEGENERATION, DISC NOS 03/16/2006    ROS    Objective:     BP 123/73   Pulse 75   Ht 5' 4 (1.626 m)   Wt 154 lb 1.3 oz (69.9 kg)   SpO2 96%   BMI 26.45 kg/m  BP Readings from Last 3 Encounters:  09/22/23 123/73  06/24/23 133/79  03/25/23 137/74   Wt Readings from Last 3 Encounters:  09/22/23 154 lb 1.3 oz (69.9 kg)  06/29/23 157 lb (71.2 kg)  06/24/23 157 lb 0.6 oz (71.2 kg)      Physical Exam Vitals and nursing note reviewed.  Constitutional:      Appearance: Normal appearance.  HENT:     Head: Normocephalic.  Eyes:     Extraocular Movements: Extraocular movements intact.     Pupils: Pupils are equal, round, and reactive to light.  Cardiovascular:     Rate and Rhythm: Normal rate and regular rhythm.  Pulmonary:     Effort: Pulmonary effort  is normal.     Breath sounds: Normal breath sounds.  Musculoskeletal:     Cervical back: Normal range of motion and neck supple.  Neurological:     Mental Status: She is alert and oriented to person, place, and time.  Psychiatric:        Mood and Affect: Mood normal.        Thought Content: Thought content normal.     The ASCVD Risk score (Arnett DK, et al., 2019) failed to calculate for the following reasons:   The 2019 ASCVD risk score is only valid for ages 77 to 52    Assessment & Plan:   Problem List Items Addressed This Visit       Cardiovascular and Mediastinum   Essential hypertension, benign   Remains adequately controlled on current antihypertensive regimen.  HCTZ was reduced to 12.5 mg daily at her last appointment in case low-normal BP readings were contributing to symptoms of dizziness.  She continues to take amlodipine  10 mg daily.  No further changes are indicated today.      Relevant Medications   amLODipine  (NORVASC ) 10 MG tablet   Other Relevant Orders   CMP14+EGFR (Completed)     Nervous and Auditory   Neuropathy, leg - Primary   Under control. Takes gabapentin  300mg TID      Relevant  Orders   B12 and Folate Panel (Completed)     Musculoskeletal and Integument   Osteoporosis   Update prescription for Fosamax .  She had bone density done in 06/2022.      Relevant Medications   alendronate  (FOSAMAX ) 70 MG tablet     Other   Vitamin D  deficiency   On vitamin D  1000 units daily Check vitamin D  labs today      Relevant Orders   Vitamin D  (25 hydroxy) (Completed)   Fatigue   Chronic issue.  Symptoms today are essentially unchanged.  She denies additional symptoms.  Labs updated in October were potentially suggestive of iron deficiency.  Will repeat iron studies today and plan to start oral iron supplementation if again suggestive of iron deficiency.      Relevant Orders   CBC with Differential/Platelet (Completed)   Iron deficiency   Recheck  levels today      Relevant Orders   CBC with Differential/Platelet (Completed)   Fe+TIBC+Fer (Completed)    No follow-ups on file.    Leita Longs, FNP

## 2023-09-23 LAB — B12 AND FOLATE PANEL
Folate: 7.3 ng/mL (ref >3.0)
Vitamin B-12: 366 pg/mL (ref 232–1245)

## 2023-09-23 LAB — CMP14+EGFR
ALT: 8 IU/L (ref 0–32)
AST: 17 IU/L (ref 0–40)
Albumin: 3.7 g/dL (ref 3.7–4.7)
Alkaline Phosphatase: 69 IU/L (ref 44–121)
BUN/Creatinine Ratio: 16 (ref 12–28)
BUN: 15 mg/dL (ref 8–27)
Bilirubin Total: 0.2 mg/dL (ref 0.0–1.2)
CO2: 22 mmol/L (ref 20–29)
Calcium: 9.7 mg/dL (ref 8.7–10.3)
Chloride: 108 mmol/L — ABNORMAL HIGH (ref 96–106)
Creatinine, Ser: 0.96 mg/dL (ref 0.57–1.00)
Globulin, Total: 2 g/dL (ref 1.5–4.5)
Glucose: 97 mg/dL (ref 70–99)
Potassium: 3.9 mmol/L (ref 3.5–5.2)
Sodium: 145 mmol/L — ABNORMAL HIGH (ref 134–144)
Total Protein: 5.7 g/dL — ABNORMAL LOW (ref 6.0–8.5)
eGFR: 58 mL/min/1.73 — ABNORMAL LOW (ref >59)

## 2023-09-23 LAB — IRON,TIBC AND FERRITIN PANEL
Ferritin: 136 ng/mL (ref 15–150)
Iron Saturation: 25 % (ref 15–55)
Iron: 79 ug/dL (ref 27–139)
Total Iron Binding Capacity: 313 ug/dL (ref 250–450)
UIBC: 234 ug/dL (ref 118–369)

## 2023-09-23 LAB — CBC WITH DIFFERENTIAL/PLATELET
Basophils Absolute: 0.1 x10E3/uL (ref 0.0–0.2)
Basos: 1 %
EOS (ABSOLUTE): 0.1 x10E3/uL (ref 0.0–0.4)
Eos: 1 %
Hematocrit: 40.9 % (ref 34.0–46.6)
Hemoglobin: 13.3 g/dL (ref 11.1–15.9)
Immature Grans (Abs): 0 x10E3/uL (ref 0.0–0.1)
Immature Granulocytes: 0 %
Lymphocytes Absolute: 2.3 x10E3/uL (ref 0.7–3.1)
Lymphs: 26 %
MCH: 30.7 pg (ref 26.6–33.0)
MCHC: 32.5 g/dL (ref 31.5–35.7)
MCV: 95 fL (ref 79–97)
Monocytes Absolute: 0.8 x10E3/uL (ref 0.1–0.9)
Monocytes: 9 %
Neutrophils Absolute: 5.5 x10E3/uL (ref 1.4–7.0)
Neutrophils: 63 %
Platelets: 209 x10E3/uL (ref 150–450)
RBC: 4.33 x10E6/uL (ref 3.77–5.28)
RDW: 12.6 % (ref 11.7–15.4)
WBC: 8.9 x10E3/uL (ref 3.4–10.8)

## 2023-09-23 LAB — VITAMIN D 25 HYDROXY (VIT D DEFICIENCY, FRACTURES): Vit D, 25-Hydroxy: 28 ng/mL — ABNORMAL LOW (ref 30.0–100.0)

## 2023-09-24 ENCOUNTER — Ambulatory Visit

## 2023-09-29 DIAGNOSIS — D485 Neoplasm of uncertain behavior of skin: Secondary | ICD-10-CM | POA: Diagnosis not present

## 2023-09-29 DIAGNOSIS — H04123 Dry eye syndrome of bilateral lacrimal glands: Secondary | ICD-10-CM | POA: Diagnosis not present

## 2023-09-29 DIAGNOSIS — H40013 Open angle with borderline findings, low risk, bilateral: Secondary | ICD-10-CM | POA: Diagnosis not present

## 2023-10-01 DIAGNOSIS — E611 Iron deficiency: Secondary | ICD-10-CM | POA: Insufficient documentation

## 2023-10-01 NOTE — Assessment & Plan Note (Signed)
 Update prescription for Fosamax .  She had bone density done in 06/2022.

## 2023-10-01 NOTE — Assessment & Plan Note (Signed)
 Chronic issue.  Symptoms today are essentially unchanged.  She denies additional symptoms.  Labs updated in October were potentially suggestive of iron deficiency.  Will repeat iron studies today and plan to start oral iron supplementation if again suggestive of iron deficiency.

## 2023-10-01 NOTE — Assessment & Plan Note (Signed)
 Remains adequately controlled on current antihypertensive regimen.  HCTZ was reduced to 12.5 mg daily at her last appointment in case low-normal BP readings were contributing to symptoms of dizziness.  She continues to take amlodipine 10 mg daily.  No further changes are indicated today.

## 2023-10-01 NOTE — Assessment & Plan Note (Signed)
On vitamin D 1000 units daily ?Check vitamin D labs today ?

## 2023-10-01 NOTE — Assessment & Plan Note (Signed)
 Recheck levels today

## 2023-10-01 NOTE — Assessment & Plan Note (Signed)
Under control. Takes gabapentin 300mg TID

## 2023-10-08 ENCOUNTER — Telehealth: Payer: Self-pay

## 2023-10-08 NOTE — Telephone Encounter (Signed)
 Copied from CRM [id number]. Topic: Clinical - Lab/Test Results >> Oct 08, 2023 10:51 AM Tonda B wrote: Reason for CRM: pt is calling to get her results please call pt back  [phone] (M)

## 2023-10-11 ENCOUNTER — Ambulatory Visit: Payer: Self-pay

## 2023-12-23 ENCOUNTER — Ambulatory Visit

## 2023-12-23 VITALS — BP 144/74 | HR 53 | Ht 64.0 in | Wt 157.1 lb

## 2023-12-23 DIAGNOSIS — G629 Polyneuropathy, unspecified: Secondary | ICD-10-CM

## 2023-12-23 DIAGNOSIS — R42 Dizziness and giddiness: Secondary | ICD-10-CM | POA: Diagnosis not present

## 2023-12-23 NOTE — Progress Notes (Signed)
 Established Patient Office Visit  Subjective   Patient ID: Alexis Davis, female    DOB: 12-Sep-1940  Age: 83 y.o. MRN: [id number]  Chief Complaint  Patient presents with   Medical Management of Chronic Issues    3 month follow up     HPI Discussed the use of AI scribe software for clinical note transcription with the patient, who gave verbal consent to proceed.  History of Present Illness   ALYA SMALTZ is an 83 year old female who presents with persistent dizziness and balance issues.  Dizziness and imbalance - Persistent sensation of lightheadedness and imbalance, described as 'swimming at the head all the time' - Symptoms ongoing for a significant period and previously discussed at a prior visit - No spinning sensation; dizziness is characterized as lightheadedness rather than vertigo - Balance issues primarily occur when standing or walking; no symptoms while sitting - No improvement in symptoms after reduction of hydrochlorothiazide  dosage  Visual assessment - Consulted a vision specialist in [place] who confirmed no ocular etiology for symptoms - Obtained new glasses without improvement in dizziness or imbalance  Peripheral neuropathy - Neuropathy in the feet, believed to contribute to balance difficulties  Laboratory findings - Recent laboratory evaluation revealed persistently low vitamin D  levels  Medication adjustment - Currently taking hydrochlorothiazide ; dosage was reduced due to concern for contribution to dizziness, but symptoms persisted      Patient Active Problem List   Diagnosis Date Noted   Iron deficiency 10/01/2023   GAD (generalized anxiety disorder) 02/15/2023   Grief reaction 02/15/2023   Fatigue 12/23/2022   Dizziness 12/23/2022   Cough 10/28/2022   Acute lower GI bleeding 09/04/2022   Rectal bleeding 09/02/2022   PRB (rectal bleeding) 09/02/2022   S/P total left hip arthroplasty 01/29/2022   Annual physical exam 07/01/2021    Seasonal allergies 07/19/2019   Peripheral polyneuropathy 11/24/2017   Vitamin D  deficiency 06/22/2016   Varicose veins 03/16/2014   OA (osteoarthritis) 11/03/2013   Osteoporosis 12/09/2012   PTTD (posterior tibial tendon dysfunction) 09/01/2012   Neuropathy, leg 04/29/2012   Rotator cuff syndrome of right shoulder 11/26/2011   COPD, mild (HCC) 10/22/2008   Essential hypertension, benign 09/25/2008   Osteoarthritis of shoulder 06/12/2008   HEPATIC CYST 03/16/2006   DEGENERATION, DISC NOS 03/16/2006      ROS    Objective:     BP (!) 144/74   Pulse (!) 53   Ht 5' 4 (1.626 m)   Wt 157 lb 1.9 oz (71.3 kg)   SpO2 98%   BMI 26.97 kg/m  BP Readings from Last 3 Encounters:  12/23/23 (!) 144/74  09/22/23 123/73  06/24/23 133/79   Wt Readings from Last 3 Encounters:  12/23/23 157 lb 1.9 oz (71.3 kg)  09/22/23 154 lb 1.3 oz (69.9 kg)  06/29/23 157 lb (71.2 kg)     Physical Exam Vitals and nursing note reviewed.  Constitutional:      Appearance: Normal appearance.  HENT:     Head: Normocephalic.     Right Ear: Tympanic membrane, ear canal and external ear normal.     Left Ear: Tympanic membrane, ear canal and external ear normal.     Nose: Nose normal.     Mouth/Throat:     Mouth: Mucous membranes are moist.     Pharynx: Oropharynx is clear.  Eyes:     Extraocular Movements: Extraocular movements intact.     Pupils: Pupils are equal, round, and reactive to light.  Cardiovascular:     Rate and Rhythm: Normal rate and regular rhythm.  Pulmonary:     Effort: Pulmonary effort is normal.     Breath sounds: Normal breath sounds.  Musculoskeletal:     Cervical back: Normal range of motion and neck supple.  Skin:    General: Skin is warm and dry.  Neurological:     General: No focal deficit present.     Mental Status: She is alert and oriented to person, place, and time.     Cranial Nerves: Cranial nerves 2-12 are intact.     Motor: Motor function is intact.      Coordination: Coordination is intact.     Gait: Gait is intact.  Psychiatric:        Mood and Affect: Mood normal.        Thought Content: Thought content normal.     No results found for any visits on 12/23/23.    The ASCVD Risk score (Arnett DK, et al., 2019) failed to calculate for the following reasons:   The 2019 ASCVD risk score is only valid for ages 63 to 29    Assessment & Plan:   Problem List Items Addressed This Visit       Nervous and Auditory   Peripheral polyneuropathy   Chronic peripheral neuropathy contributing to balance issues. Assessment and Plan  - Order CT scan of the head to evaluate for central causes. - Consider referral to physical therapy for vestibular therapy if CT scan is unremarkable.            Other   Dizziness - Primary   Chronic dizziness and imbalance with lightheadedness. No prior CT or MRI performed. - Order CT scan of the head to evaluate for central causes. - Consider referral to physical therapy for vestibular therapy if CT scan is unremarkable.      Relevant Orders   CT HEAD WO CONTRAST ( )    Return in about 6 months (around 06/22/2024) for chronic follow-up with PCP.    Leita Longs, FNP

## 2023-12-26 NOTE — Assessment & Plan Note (Signed)
 Chronic peripheral neuropathy contributing to balance issues. Assessment and Plan  - Order CT scan of the head to evaluate for central causes. - Consider referral to physical therapy for vestibular therapy if CT scan is unremarkable.

## 2023-12-26 NOTE — Assessment & Plan Note (Signed)
 Chronic dizziness and imbalance with lightheadedness. No prior CT or MRI performed. - Order CT scan of the head to evaluate for central causes. - Consider referral to physical therapy for vestibular therapy if CT scan is unremarkable.

## 2023-12-30 ENCOUNTER — Ambulatory Visit (HOSPITAL_COMMUNITY)
Admission: RE | Admit: 2023-12-30 | Discharge: 2023-12-30 | Disposition: A | Discharge status: Home / Self Care | Source: Ambulatory Visit

## 2023-12-30 DIAGNOSIS — R42 Dizziness and giddiness: Secondary | ICD-10-CM | POA: Diagnosis present

## 2024-01-07 ENCOUNTER — Telehealth: Payer: Self-pay

## 2024-01-07 NOTE — Telephone Encounter (Signed)
 Copied from CRM #[id number]. Topic: Clinical - Lab/Test Results >> Jan 07, 2024  2:48 PM Emylou G wrote: Reason for CRM: Pls call patient - wants to review scan

## 2024-01-10 ENCOUNTER — Ambulatory Visit: Payer: Self-pay

## 2024-06-22 ENCOUNTER — Ambulatory Visit

## 2024-07-03 ENCOUNTER — Ambulatory Visit
